# Patient Record
Sex: Female | Born: 1955 | Race: White | Hispanic: No | Marital: Married | Smoking: Never smoker
Health system: Southern US, Community
[De-identification: ages and names within clinical notes are randomized; demographics above are authoritative.]

## PROBLEM LIST (undated history)

## (undated) DIAGNOSIS — R413 Other amnesia: Secondary | ICD-10-CM

## (undated) DIAGNOSIS — F32A Depression, unspecified: Secondary | ICD-10-CM

## (undated) DIAGNOSIS — N259 Disorder resulting from impaired renal tubular function, unspecified: Secondary | ICD-10-CM

## (undated) DIAGNOSIS — F419 Anxiety disorder, unspecified: Secondary | ICD-10-CM

## (undated) DIAGNOSIS — T7840XA Allergy, unspecified, initial encounter: Secondary | ICD-10-CM

## (undated) DIAGNOSIS — J309 Allergic rhinitis, unspecified: Secondary | ICD-10-CM

## (undated) DIAGNOSIS — D649 Anemia, unspecified: Secondary | ICD-10-CM

## (undated) DIAGNOSIS — G43909 Migraine, unspecified, not intractable, without status migrainosus: Secondary | ICD-10-CM

## (undated) DIAGNOSIS — D689 Coagulation defect, unspecified: Secondary | ICD-10-CM

## (undated) DIAGNOSIS — I10 Essential (primary) hypertension: Secondary | ICD-10-CM

## (undated) DIAGNOSIS — Z86718 Personal history of other venous thrombosis and embolism: Secondary | ICD-10-CM

## (undated) DIAGNOSIS — M1990 Unspecified osteoarthritis, unspecified site: Secondary | ICD-10-CM

## (undated) DIAGNOSIS — K219 Gastro-esophageal reflux disease without esophagitis: Secondary | ICD-10-CM

## (undated) DIAGNOSIS — F09 Unspecified mental disorder due to known physiological condition: Secondary | ICD-10-CM

## (undated) DIAGNOSIS — H269 Unspecified cataract: Secondary | ICD-10-CM

## (undated) DIAGNOSIS — E785 Hyperlipidemia, unspecified: Principal | ICD-10-CM

## (undated) DIAGNOSIS — Z853 Personal history of malignant neoplasm of breast: Secondary | ICD-10-CM

## (undated) HISTORY — DX: Essential (primary) hypertension: I10

## (undated) HISTORY — DX: Allergic rhinitis, unspecified: J30.9

## (undated) HISTORY — DX: Depression, unspecified: F32.A

## (undated) HISTORY — DX: Disorder resulting from impaired renal tubular function, unspecified: N25.9

## (undated) HISTORY — PX: BREAST SURGERY: SHX581

## (undated) HISTORY — DX: Personal history of other venous thrombosis and embolism: Z86.718

## (undated) HISTORY — PX: ABDOMINAL HYSTERECTOMY: SHX81

## (undated) HISTORY — DX: Unspecified mental disorder due to known physiological condition: F09

## (undated) HISTORY — DX: Unspecified cataract: H26.9

## (undated) HISTORY — PX: NISSEN FUNDOPLICATION: SHX2091

## (undated) HISTORY — DX: Other amnesia: R41.3

## (undated) HISTORY — DX: Migraine, unspecified, not intractable, without status migrainosus: G43.909

## (undated) HISTORY — PX: VOCAL CORD INJECTION: SHX2663

## (undated) HISTORY — PX: TUBAL LIGATION: SHX77

## (undated) HISTORY — DX: Hyperlipidemia, unspecified: E78.5

## (undated) HISTORY — DX: Gastro-esophageal reflux disease without esophagitis: K21.9

## (undated) HISTORY — DX: Coagulation defect, unspecified: D68.9

## (undated) HISTORY — PX: CHOLECYSTECTOMY: SHX55

## (undated) HISTORY — DX: Personal history of malignant neoplasm of breast: Z85.3

## (undated) HISTORY — PX: KNEE SURGERY: SHX244

## (undated) HISTORY — DX: Unspecified osteoarthritis, unspecified site: M19.90

## (undated) HISTORY — DX: Allergy, unspecified, initial encounter: T78.40XA

## (undated) HISTORY — DX: Anxiety disorder, unspecified: F41.9

## (undated) HISTORY — DX: Anemia, unspecified: D64.9

## (undated) HISTORY — PX: WISDOM TOOTH EXTRACTION: SHX21

---

## 1994-03-17 HISTORY — PX: KNEE SURGERY: SHX244

## 1998-03-15 ENCOUNTER — Other Ambulatory Visit: Admission: RE | Admit: 1998-03-15 | Discharge: 1998-03-15 | Payer: Self-pay | Admitting: Obstetrics and Gynecology

## 1998-05-06 ENCOUNTER — Encounter: Payer: Self-pay | Admitting: Emergency Medicine

## 1998-05-06 ENCOUNTER — Emergency Department (HOSPITAL_COMMUNITY): Admission: EM | Admit: 1998-05-06 | Discharge: 1998-05-06 | Payer: Self-pay | Admitting: Emergency Medicine

## 1998-11-14 ENCOUNTER — Other Ambulatory Visit: Admission: RE | Admit: 1998-11-14 | Discharge: 1998-11-14 | Payer: Self-pay | Admitting: Internal Medicine

## 1999-03-18 DIAGNOSIS — Z86718 Personal history of other venous thrombosis and embolism: Secondary | ICD-10-CM

## 1999-03-18 HISTORY — DX: Personal history of other venous thrombosis and embolism: Z86.718

## 1999-03-18 HISTORY — PX: ABDOMINAL HYSTERECTOMY: SHX81

## 1999-09-19 ENCOUNTER — Encounter (INDEPENDENT_AMBULATORY_CARE_PROVIDER_SITE_OTHER): Payer: Self-pay | Admitting: Specialist

## 1999-09-19 ENCOUNTER — Other Ambulatory Visit: Admission: RE | Admit: 1999-09-19 | Discharge: 1999-09-19 | Payer: Self-pay | Admitting: Internal Medicine

## 2000-04-09 ENCOUNTER — Ambulatory Visit (HOSPITAL_COMMUNITY): Admission: RE | Admit: 2000-04-09 | Discharge: 2000-04-09 | Payer: Self-pay | Admitting: Internal Medicine

## 2000-04-09 ENCOUNTER — Encounter: Payer: Self-pay | Admitting: Internal Medicine

## 2000-09-28 ENCOUNTER — Other Ambulatory Visit: Admission: RE | Admit: 2000-09-28 | Discharge: 2000-09-28 | Payer: Self-pay | Admitting: Obstetrics and Gynecology

## 2000-10-02 ENCOUNTER — Encounter (INDEPENDENT_AMBULATORY_CARE_PROVIDER_SITE_OTHER): Payer: Self-pay

## 2000-10-02 ENCOUNTER — Other Ambulatory Visit: Admission: RE | Admit: 2000-10-02 | Discharge: 2000-10-02 | Payer: Self-pay | Admitting: Obstetrics and Gynecology

## 2000-10-05 ENCOUNTER — Encounter: Admission: RE | Admit: 2000-10-05 | Discharge: 2000-10-05 | Payer: Self-pay | Admitting: Obstetrics and Gynecology

## 2000-10-05 ENCOUNTER — Encounter: Payer: Self-pay | Admitting: Obstetrics and Gynecology

## 2001-02-04 ENCOUNTER — Encounter (INDEPENDENT_AMBULATORY_CARE_PROVIDER_SITE_OTHER): Payer: Self-pay | Admitting: Specialist

## 2001-02-05 ENCOUNTER — Inpatient Hospital Stay (HOSPITAL_COMMUNITY): Admission: RE | Admit: 2001-02-05 | Discharge: 2001-02-06 | Payer: Self-pay | Admitting: *Deleted

## 2001-02-13 ENCOUNTER — Inpatient Hospital Stay (HOSPITAL_COMMUNITY): Admission: EM | Admit: 2001-02-13 | Discharge: 2001-02-22 | Payer: Self-pay | Admitting: Emergency Medicine

## 2001-02-13 ENCOUNTER — Encounter: Payer: Self-pay | Admitting: *Deleted

## 2002-10-20 ENCOUNTER — Encounter: Payer: Self-pay | Admitting: Orthopedic Surgery

## 2002-10-20 ENCOUNTER — Encounter: Admission: RE | Admit: 2002-10-20 | Discharge: 2002-10-20 | Payer: Self-pay | Admitting: Orthopedic Surgery

## 2003-03-09 ENCOUNTER — Encounter: Admission: RE | Admit: 2003-03-09 | Discharge: 2003-03-09 | Payer: Self-pay | Admitting: Surgery

## 2003-03-09 ENCOUNTER — Encounter (INDEPENDENT_AMBULATORY_CARE_PROVIDER_SITE_OTHER): Payer: Self-pay | Admitting: *Deleted

## 2004-01-19 ENCOUNTER — Ambulatory Visit: Payer: Self-pay | Admitting: Internal Medicine

## 2004-05-17 ENCOUNTER — Ambulatory Visit: Payer: Self-pay | Admitting: Internal Medicine

## 2004-05-20 ENCOUNTER — Ambulatory Visit: Payer: Self-pay | Admitting: Internal Medicine

## 2004-05-30 ENCOUNTER — Ambulatory Visit: Payer: Self-pay | Admitting: Internal Medicine

## 2004-06-03 ENCOUNTER — Ambulatory Visit: Payer: Self-pay | Admitting: Internal Medicine

## 2004-06-17 ENCOUNTER — Ambulatory Visit: Payer: Self-pay | Admitting: Internal Medicine

## 2004-09-24 ENCOUNTER — Ambulatory Visit: Payer: Self-pay | Admitting: Internal Medicine

## 2004-10-22 ENCOUNTER — Ambulatory Visit: Payer: Self-pay | Admitting: Internal Medicine

## 2004-12-27 ENCOUNTER — Ambulatory Visit: Payer: Self-pay | Admitting: Internal Medicine

## 2005-01-23 ENCOUNTER — Ambulatory Visit: Payer: Self-pay | Admitting: Internal Medicine

## 2005-03-13 ENCOUNTER — Ambulatory Visit: Payer: Self-pay | Admitting: Internal Medicine

## 2005-03-17 DIAGNOSIS — C50919 Malignant neoplasm of unspecified site of unspecified female breast: Secondary | ICD-10-CM

## 2005-03-17 HISTORY — DX: Malignant neoplasm of unspecified site of unspecified female breast: C50.919

## 2005-04-17 ENCOUNTER — Ambulatory Visit: Payer: Self-pay | Admitting: Internal Medicine

## 2005-06-16 ENCOUNTER — Ambulatory Visit: Payer: Self-pay | Admitting: Internal Medicine

## 2005-09-15 ENCOUNTER — Ambulatory Visit: Payer: Self-pay | Admitting: Internal Medicine

## 2005-09-23 ENCOUNTER — Other Ambulatory Visit: Admission: RE | Admit: 2005-09-23 | Discharge: 2005-09-23 | Payer: Self-pay | Admitting: *Deleted

## 2005-10-16 ENCOUNTER — Encounter (INDEPENDENT_AMBULATORY_CARE_PROVIDER_SITE_OTHER): Payer: Self-pay | Admitting: *Deleted

## 2005-10-16 ENCOUNTER — Encounter (INDEPENDENT_AMBULATORY_CARE_PROVIDER_SITE_OTHER): Payer: Self-pay | Admitting: Diagnostic Radiology

## 2005-10-16 ENCOUNTER — Encounter: Admission: RE | Admit: 2005-10-16 | Discharge: 2005-10-16 | Payer: Self-pay | Admitting: Surgery

## 2005-10-24 ENCOUNTER — Encounter: Admission: RE | Admit: 2005-10-24 | Discharge: 2005-10-24 | Payer: Self-pay | Admitting: Internal Medicine

## 2005-10-28 ENCOUNTER — Encounter: Admission: RE | Admit: 2005-10-28 | Discharge: 2005-10-28 | Payer: Self-pay | Admitting: Surgery

## 2005-10-31 ENCOUNTER — Encounter (INDEPENDENT_AMBULATORY_CARE_PROVIDER_SITE_OTHER): Payer: Self-pay | Admitting: *Deleted

## 2005-10-31 ENCOUNTER — Encounter: Admission: RE | Admit: 2005-10-31 | Discharge: 2005-10-31 | Payer: Self-pay | Admitting: Surgery

## 2005-10-31 ENCOUNTER — Ambulatory Visit (HOSPITAL_BASED_OUTPATIENT_CLINIC_OR_DEPARTMENT_OTHER): Admission: RE | Admit: 2005-10-31 | Discharge: 2005-10-31 | Payer: Self-pay | Admitting: Surgery

## 2005-11-04 ENCOUNTER — Ambulatory Visit: Payer: Self-pay | Admitting: Oncology

## 2005-11-12 LAB — COMPREHENSIVE METABOLIC PANEL
AST: 14 U/L (ref 0–37)
Albumin: 4.2 g/dL (ref 3.5–5.2)
Alkaline Phosphatase: 81 U/L (ref 39–117)
BUN: 26 mg/dL — ABNORMAL HIGH (ref 6–23)
Calcium: 9.5 mg/dL (ref 8.4–10.5)
Chloride: 106 mEq/L (ref 96–112)
Creatinine, Ser: 0.85 mg/dL (ref 0.40–1.20)
Glucose, Bld: 105 mg/dL — ABNORMAL HIGH (ref 70–99)
Potassium: 4.2 mEq/L (ref 3.5–5.3)

## 2005-11-12 LAB — CBC WITH DIFFERENTIAL/PLATELET
Basophils Absolute: 0 10*3/uL (ref 0.0–0.1)
EOS%: 2.5 % (ref 0.0–7.0)
Eosinophils Absolute: 0.2 10*3/uL (ref 0.0–0.5)
HCT: 34.3 % — ABNORMAL LOW (ref 34.8–46.6)
HGB: 11.9 g/dL (ref 11.6–15.9)
MCH: 32 pg (ref 26.0–34.0)
MCV: 92.4 fL (ref 81.0–101.0)
MONO%: 7.3 % (ref 0.0–13.0)
NEUT#: 4.8 10*3/uL (ref 1.5–6.5)
NEUT%: 60 % (ref 39.6–76.8)

## 2005-11-12 LAB — LACTATE DEHYDROGENASE: LDH: 148 U/L (ref 94–250)

## 2005-11-14 ENCOUNTER — Ambulatory Visit (HOSPITAL_COMMUNITY): Admission: RE | Admit: 2005-11-14 | Discharge: 2005-11-14 | Payer: Self-pay | Admitting: Oncology

## 2005-11-20 ENCOUNTER — Ambulatory Visit (HOSPITAL_COMMUNITY): Admission: RE | Admit: 2005-11-20 | Discharge: 2005-11-20 | Payer: Self-pay | Admitting: Oncology

## 2005-11-25 ENCOUNTER — Ambulatory Visit: Admission: RE | Admit: 2005-11-25 | Discharge: 2005-11-25 | Payer: Self-pay | Admitting: Oncology

## 2005-11-25 ENCOUNTER — Encounter (INDEPENDENT_AMBULATORY_CARE_PROVIDER_SITE_OTHER): Payer: Self-pay | Admitting: Cardiology

## 2005-11-27 ENCOUNTER — Ambulatory Visit (HOSPITAL_BASED_OUTPATIENT_CLINIC_OR_DEPARTMENT_OTHER): Admission: RE | Admit: 2005-11-27 | Discharge: 2005-11-27 | Payer: Self-pay | Admitting: Surgery

## 2005-11-27 ENCOUNTER — Encounter (INDEPENDENT_AMBULATORY_CARE_PROVIDER_SITE_OTHER): Payer: Self-pay | Admitting: *Deleted

## 2005-12-02 ENCOUNTER — Ambulatory Visit: Payer: Self-pay | Admitting: Internal Medicine

## 2005-12-02 ENCOUNTER — Encounter: Admission: RE | Admit: 2005-12-02 | Discharge: 2005-12-02 | Payer: Self-pay | Admitting: Surgery

## 2005-12-09 ENCOUNTER — Ambulatory Visit: Payer: Self-pay | Admitting: Internal Medicine

## 2005-12-18 ENCOUNTER — Ambulatory Visit: Payer: Self-pay | Admitting: Oncology

## 2005-12-18 ENCOUNTER — Ambulatory Visit (HOSPITAL_COMMUNITY): Admission: RE | Admit: 2005-12-18 | Discharge: 2005-12-18 | Payer: Self-pay | Admitting: Oncology

## 2005-12-24 LAB — CBC WITH DIFFERENTIAL/PLATELET
Basophils Absolute: 0 10*3/uL (ref 0.0–0.1)
Eosinophils Absolute: 0.1 10*3/uL (ref 0.0–0.5)
HGB: 12.3 g/dL (ref 11.6–15.9)
MONO#: 0 10*3/uL — ABNORMAL LOW (ref 0.1–0.9)
NEUT#: 1.9 10*3/uL (ref 1.5–6.5)
RBC: 3.84 10*6/uL (ref 3.70–5.32)
RDW: 12.8 % (ref 11.3–14.5)
WBC: 3.2 10*3/uL — ABNORMAL LOW (ref 3.9–10.0)

## 2005-12-31 LAB — CBC WITH DIFFERENTIAL/PLATELET
Basophils Absolute: 0 10*3/uL (ref 0.0–0.1)
Eosinophils Absolute: 0.1 10*3/uL (ref 0.0–0.5)
HCT: 33.5 % — ABNORMAL LOW (ref 34.8–46.6)
HGB: 11.6 g/dL (ref 11.6–15.9)
LYMPH%: 10.2 % — ABNORMAL LOW (ref 14.0–48.0)
MCV: 92.1 fL (ref 81.0–101.0)
MONO#: 1.1 10*3/uL — ABNORMAL HIGH (ref 0.1–0.9)
NEUT#: 16.7 10*3/uL — ABNORMAL HIGH (ref 1.5–6.5)
NEUT%: 84.1 % — ABNORMAL HIGH (ref 39.6–76.8)
Platelets: 306 10*3/uL (ref 145–400)
WBC: 19.8 10*3/uL — ABNORMAL HIGH (ref 3.9–10.0)

## 2006-01-14 LAB — CBC WITH DIFFERENTIAL/PLATELET
Basophils Absolute: 0 10*3/uL (ref 0.0–0.1)
Eosinophils Absolute: 0 10*3/uL (ref 0.0–0.5)
HCT: 30.8 % — ABNORMAL LOW (ref 34.8–46.6)
HGB: 10.7 g/dL — ABNORMAL LOW (ref 11.6–15.9)
MONO#: 0.8 10*3/uL (ref 0.1–0.9)
NEUT#: 13.5 10*3/uL — ABNORMAL HIGH (ref 1.5–6.5)
NEUT%: 85 % — ABNORMAL HIGH (ref 39.6–76.8)
RDW: 12.5 % (ref 11.3–14.5)
WBC: 15.9 10*3/uL — ABNORMAL HIGH (ref 3.9–10.0)
lymph#: 1.6 10*3/uL (ref 0.9–3.3)

## 2006-01-21 LAB — COMPREHENSIVE METABOLIC PANEL
AST: 13 U/L (ref 0–37)
Alkaline Phosphatase: 129 U/L — ABNORMAL HIGH (ref 39–117)
BUN: 21 mg/dL (ref 6–23)
Glucose, Bld: 124 mg/dL — ABNORMAL HIGH (ref 70–99)
Sodium: 136 mEq/L (ref 135–145)
Total Bilirubin: 0.5 mg/dL (ref 0.3–1.2)
Total Protein: 5.7 g/dL — ABNORMAL LOW (ref 6.0–8.3)

## 2006-01-21 LAB — CBC WITH DIFFERENTIAL/PLATELET
EOS%: 0.1 % (ref 0.0–7.0)
Eosinophils Absolute: 0 10*3/uL (ref 0.0–0.5)
LYMPH%: 10.3 % — ABNORMAL LOW (ref 14.0–48.0)
MCH: 31.4 pg (ref 26.0–34.0)
MCHC: 34.2 g/dL (ref 32.0–36.0)
MCV: 91.7 fL (ref 81.0–101.0)
MONO%: 0.5 % (ref 0.0–13.0)
NEUT#: 3.5 10*3/uL (ref 1.5–6.5)
Platelets: 189 10*3/uL (ref 145–400)
RBC: 3.7 10*6/uL (ref 3.70–5.32)
RDW: 13 % (ref 11.3–14.5)

## 2006-01-28 LAB — CBC WITH DIFFERENTIAL/PLATELET
Basophils Absolute: 0 10*3/uL (ref 0.0–0.1)
Eosinophils Absolute: 0 10*3/uL (ref 0.0–0.5)
HGB: 11 g/dL — ABNORMAL LOW (ref 11.6–15.9)
MONO#: 0.8 10*3/uL (ref 0.1–0.9)
NEUT#: 10.1 10*3/uL — ABNORMAL HIGH (ref 1.5–6.5)
RBC: 3.48 10*6/uL — ABNORMAL LOW (ref 3.70–5.32)
RDW: 13.9 % (ref 11.3–14.5)
WBC: 11.7 10*3/uL — ABNORMAL HIGH (ref 3.9–10.0)
lymph#: 0.8 10*3/uL — ABNORMAL LOW (ref 0.9–3.3)

## 2006-01-29 ENCOUNTER — Ambulatory Visit: Payer: Self-pay | Admitting: Oncology

## 2006-02-02 LAB — CBC WITH DIFFERENTIAL/PLATELET
Basophils Absolute: 0 10*3/uL (ref 0.0–0.1)
Eosinophils Absolute: 0 10*3/uL (ref 0.0–0.5)
HGB: 10.3 g/dL — ABNORMAL LOW (ref 11.6–15.9)
LYMPH%: 2.1 % — ABNORMAL LOW (ref 14.0–48.0)
MCV: 91.4 fL (ref 81.0–101.0)
MONO#: 0 10*3/uL — ABNORMAL LOW (ref 0.1–0.9)
MONO%: 0.4 % (ref 0.0–13.0)
NEUT#: 12 10*3/uL — ABNORMAL HIGH (ref 1.5–6.5)
Platelets: 142 10*3/uL — ABNORMAL LOW (ref 145–400)
RBC: 3.31 10*6/uL — ABNORMAL LOW (ref 3.70–5.32)
WBC: 12.4 10*3/uL — ABNORMAL HIGH (ref 3.9–10.0)

## 2006-02-02 LAB — COMPREHENSIVE METABOLIC PANEL
Albumin: 3.9 g/dL (ref 3.5–5.2)
Alkaline Phosphatase: 123 U/L — ABNORMAL HIGH (ref 39–117)
BUN: 13 mg/dL (ref 6–23)
CO2: 21 mEq/L (ref 19–32)
Glucose, Bld: 92 mg/dL (ref 70–99)
Potassium: 3.8 mEq/L (ref 3.5–5.3)
Sodium: 139 mEq/L (ref 135–145)
Total Bilirubin: 0.5 mg/dL (ref 0.3–1.2)
Total Protein: 5.8 g/dL — ABNORMAL LOW (ref 6.0–8.3)

## 2006-02-05 ENCOUNTER — Ambulatory Visit: Payer: Self-pay | Admitting: Oncology

## 2006-02-06 ENCOUNTER — Inpatient Hospital Stay (HOSPITAL_COMMUNITY): Admission: EM | Admit: 2006-02-06 | Discharge: 2006-02-09 | Payer: Self-pay | Admitting: Emergency Medicine

## 2006-02-16 ENCOUNTER — Ambulatory Visit: Payer: Self-pay | Admitting: Internal Medicine

## 2006-02-17 ENCOUNTER — Encounter: Admission: RE | Admit: 2006-02-17 | Discharge: 2006-02-17 | Payer: Self-pay | Admitting: Internal Medicine

## 2006-03-17 HISTORY — PX: BREAST SURGERY: SHX581

## 2006-03-20 ENCOUNTER — Ambulatory Visit: Payer: Self-pay | Admitting: Oncology

## 2006-03-30 LAB — CBC WITH DIFFERENTIAL/PLATELET
BASO%: 0.2 % (ref 0.0–2.0)
Eosinophils Absolute: 0.1 10*3/uL (ref 0.0–0.5)
LYMPH%: 30.1 % (ref 14.0–48.0)
MCHC: 34.6 g/dL (ref 32.0–36.0)
MONO#: 0.4 10*3/uL (ref 0.1–0.9)
NEUT#: 3.6 10*3/uL (ref 1.5–6.5)
Platelets: 317 10*3/uL (ref 145–400)
RBC: 3.73 10*6/uL (ref 3.70–5.32)
WBC: 5.8 10*3/uL (ref 3.9–10.0)
lymph#: 1.8 10*3/uL (ref 0.9–3.3)

## 2006-03-30 LAB — COMPREHENSIVE METABOLIC PANEL
ALT: 21 U/L (ref 0–35)
AST: 16 U/L (ref 0–37)
Albumin: 4.3 g/dL (ref 3.5–5.2)
BUN: 19 mg/dL (ref 6–23)
Calcium: 9.5 mg/dL (ref 8.4–10.5)
Chloride: 109 mEq/L (ref 96–112)
Potassium: 4.1 mEq/L (ref 3.5–5.3)

## 2006-04-09 ENCOUNTER — Encounter (INDEPENDENT_AMBULATORY_CARE_PROVIDER_SITE_OTHER): Payer: Self-pay | Admitting: *Deleted

## 2006-04-10 ENCOUNTER — Inpatient Hospital Stay (HOSPITAL_COMMUNITY): Admission: EM | Admit: 2006-04-10 | Discharge: 2006-04-11 | Payer: Self-pay | Admitting: Surgery

## 2006-04-17 ENCOUNTER — Ambulatory Visit: Admission: RE | Admit: 2006-04-17 | Discharge: 2006-07-22 | Payer: Self-pay | Admitting: Radiation Oncology

## 2006-06-18 ENCOUNTER — Ambulatory Visit: Payer: Self-pay | Admitting: Oncology

## 2006-06-23 LAB — CBC WITH DIFFERENTIAL/PLATELET
EOS%: 1.2 % (ref 0.0–7.0)
MCH: 31.2 pg (ref 26.0–34.0)
MCHC: 36.3 g/dL — ABNORMAL HIGH (ref 32.0–36.0)
MCV: 86 fL (ref 81.0–101.0)
MONO%: 8.7 % (ref 0.0–13.0)
RBC: 4.01 10*6/uL (ref 3.70–5.32)
RDW: 12.9 % (ref 11.3–14.5)

## 2006-06-23 LAB — COMPREHENSIVE METABOLIC PANEL
AST: 13 U/L (ref 0–37)
Albumin: 4.3 g/dL (ref 3.5–5.2)
Alkaline Phosphatase: 88 U/L (ref 39–117)
BUN: 17 mg/dL (ref 6–23)
Potassium: 3.8 mEq/L (ref 3.5–5.3)
Sodium: 141 mEq/L (ref 135–145)
Total Protein: 6.8 g/dL (ref 6.0–8.3)

## 2006-07-03 LAB — ESTRADIOL, ULTRA SENS: Estradiol, Ultra Sensitive: <2 pg/mL

## 2006-07-07 LAB — CBC WITH DIFFERENTIAL/PLATELET
BASO%: 0.4 % (ref 0.0–2.0)
Eosinophils Absolute: 0.1 10*3/uL (ref 0.0–0.5)
HCT: 36 % (ref 34.8–46.6)
MCHC: 35.1 g/dL (ref 32.0–36.0)
MONO#: 0.2 10*3/uL (ref 0.1–0.9)
NEUT#: 2.6 10*3/uL (ref 1.5–6.5)
RBC: 4.11 10*6/uL (ref 3.70–5.32)
WBC: 3.6 10*3/uL — ABNORMAL LOW (ref 3.9–10.0)
lymph#: 0.7 10*3/uL — ABNORMAL LOW (ref 0.9–3.3)

## 2006-07-07 LAB — CANCER ANTIGEN 27.29: CA 27.29: 14 U/mL (ref 0–39)

## 2006-07-07 LAB — COMPREHENSIVE METABOLIC PANEL
ALT: 23 U/L (ref 0–35)
Albumin: 4.4 g/dL (ref 3.5–5.2)
CO2: 24 mEq/L (ref 19–32)
Calcium: 9.5 mg/dL (ref 8.4–10.5)
Chloride: 108 mEq/L (ref 96–112)
Sodium: 142 mEq/L (ref 135–145)
Total Protein: 7.1 g/dL (ref 6.0–8.3)

## 2006-07-09 ENCOUNTER — Ambulatory Visit: Payer: Self-pay | Admitting: Internal Medicine

## 2006-07-09 LAB — CONVERTED CEMR LAB
Cholesterol: 196 mg/dL (ref 0–200)
Direct LDL: 105.9 mg/dL
HDL: 41.8 mg/dL (ref >39.0)
Total CHOL/HDL Ratio: 4.7
Triglycerides: 273 mg/dL (ref 0–149)
VLDL: 55 mg/dL — ABNORMAL HIGH (ref 0–40)

## 2006-07-15 ENCOUNTER — Ambulatory Visit: Payer: Self-pay | Admitting: Family Medicine

## 2006-07-27 ENCOUNTER — Ambulatory Visit (HOSPITAL_COMMUNITY): Admission: RE | Admit: 2006-07-27 | Discharge: 2006-07-28 | Payer: Self-pay | Admitting: *Deleted

## 2006-07-27 ENCOUNTER — Encounter (INDEPENDENT_AMBULATORY_CARE_PROVIDER_SITE_OTHER): Payer: Self-pay | Admitting: Specialist

## 2006-08-06 ENCOUNTER — Ambulatory Visit: Payer: Self-pay | Admitting: Oncology

## 2006-08-14 ENCOUNTER — Ambulatory Visit: Payer: Self-pay | Admitting: Internal Medicine

## 2006-09-22 ENCOUNTER — Ambulatory Visit: Payer: Self-pay | Admitting: Oncology

## 2006-10-26 ENCOUNTER — Telehealth: Payer: Self-pay | Admitting: Internal Medicine

## 2006-10-27 DIAGNOSIS — E785 Hyperlipidemia, unspecified: Secondary | ICD-10-CM

## 2006-10-27 DIAGNOSIS — K219 Gastro-esophageal reflux disease without esophagitis: Secondary | ICD-10-CM | POA: Insufficient documentation

## 2006-10-27 DIAGNOSIS — J309 Allergic rhinitis, unspecified: Secondary | ICD-10-CM

## 2006-10-27 DIAGNOSIS — I10 Essential (primary) hypertension: Secondary | ICD-10-CM | POA: Insufficient documentation

## 2006-10-27 DIAGNOSIS — Z853 Personal history of malignant neoplasm of breast: Secondary | ICD-10-CM | POA: Insufficient documentation

## 2006-10-27 DIAGNOSIS — Z86718 Personal history of other venous thrombosis and embolism: Secondary | ICD-10-CM | POA: Insufficient documentation

## 2006-10-27 HISTORY — DX: Essential (primary) hypertension: I10

## 2006-10-27 HISTORY — DX: Gastro-esophageal reflux disease without esophagitis: K21.9

## 2006-10-27 HISTORY — DX: Hyperlipidemia, unspecified: E78.5

## 2006-10-27 HISTORY — DX: Allergic rhinitis, unspecified: J30.9

## 2006-12-10 IMAGING — CT CT CHEST W/ CM
3 series · 17 of 30 positions shown, 19 images · IV contrast ([ID] OMNIP 300%)
Comparison: none

CLINICAL DATA: Recently diagnosed right breast cancer with lymph node metastases.  Staging.  
CHEST CT WITH CONTRAST:
TECHNIQUE: Multidetector CT imaging of the chest was performed following the standard protocol during bolus administration of intravenous contrast.
Contrast:  100 ml Omnipaque 300.

[Series 2: chest w/ · axial · 0.62mm/px · z∈[-234,-59]mm · 5 of 59 slices shown, 7 images]
[im 12/59  mediastinal]
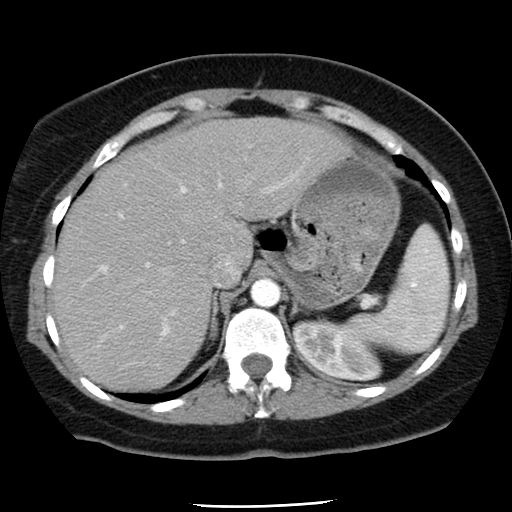
[im 12/59  lung]
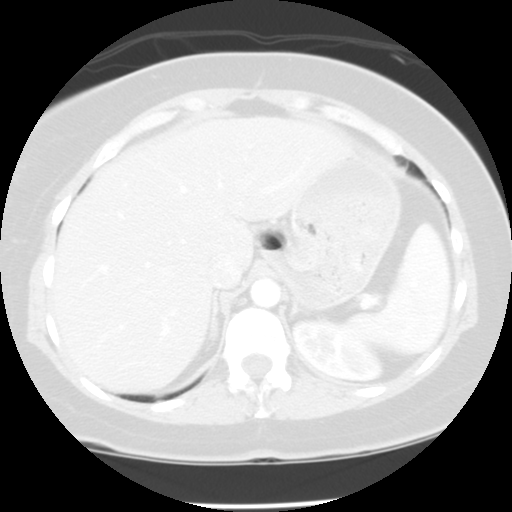
[im 24/59  lung]
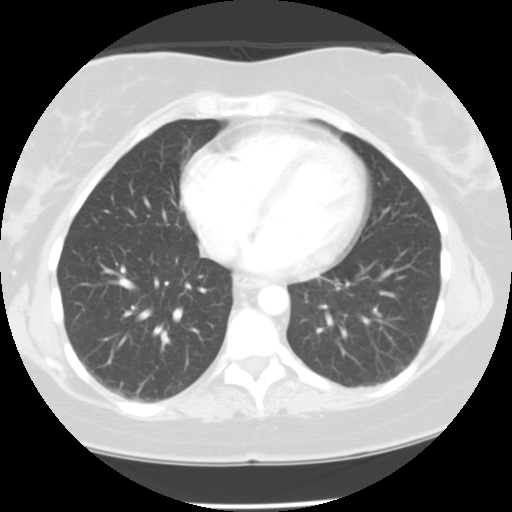
[im 33/59  lung]
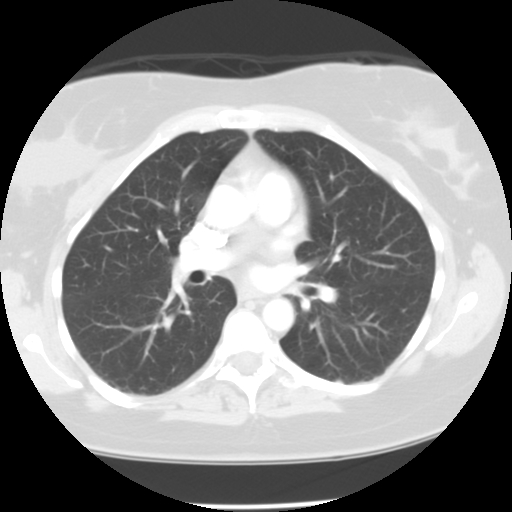
[im 35/59  lung]
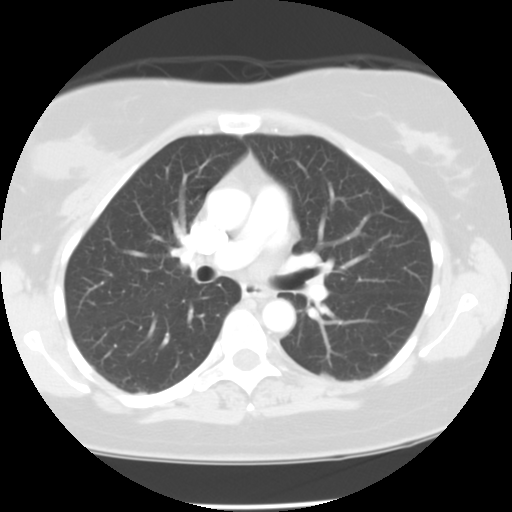
[im 47/59  mediastinal]
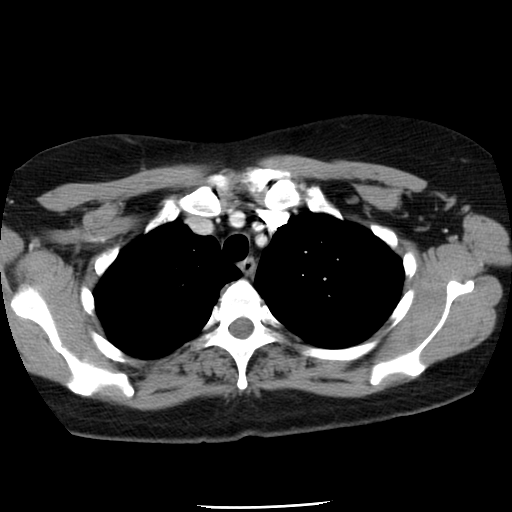
[im 47/59  lung]
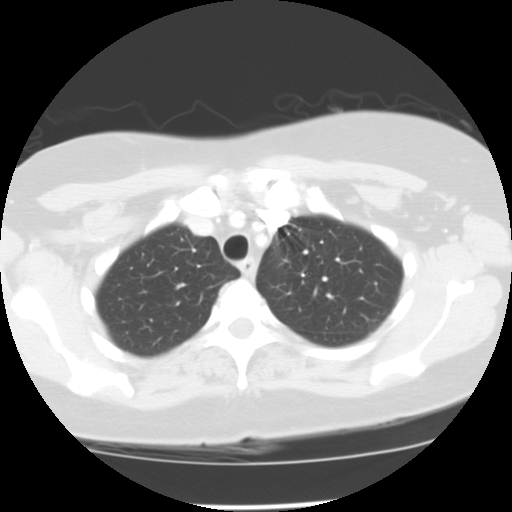

[Series 400: reformatted · coronal · 0.62mm/px · 4 of 96 slices shown (1 of 2)]
[im 11/96  lung]
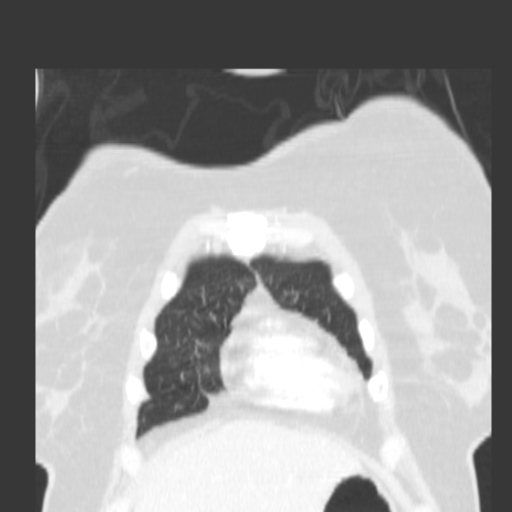
[im 22/96  lung]
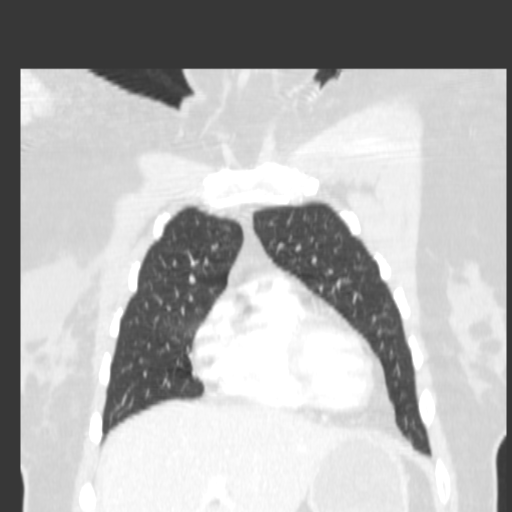
[im 32/96  lung]
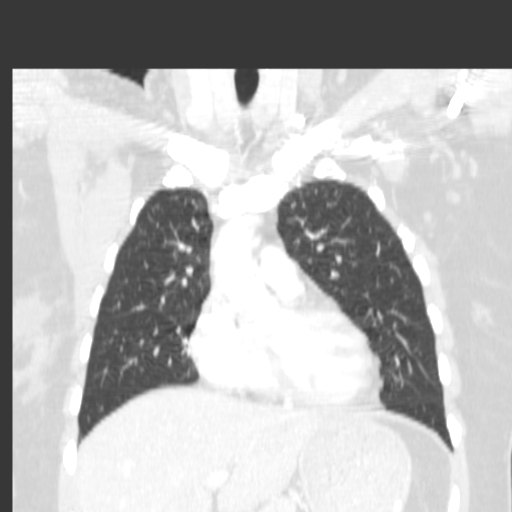
[im 43/96  lung]
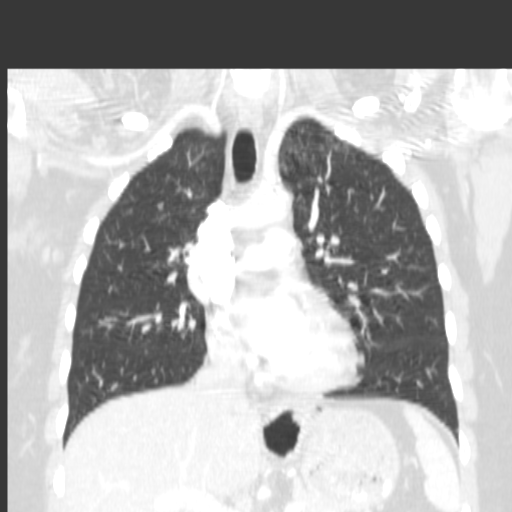

[Series 401: reformatted · sagittal · 0.62mm/px · 8 of 125 slices shown (2 of 2)]
[im 12/125  lung]
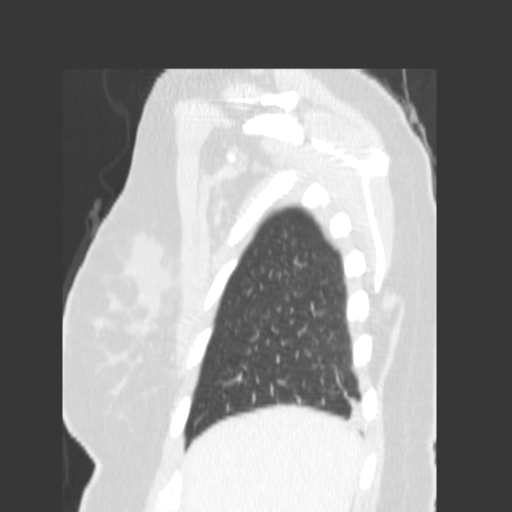
[im 34/125  lung]
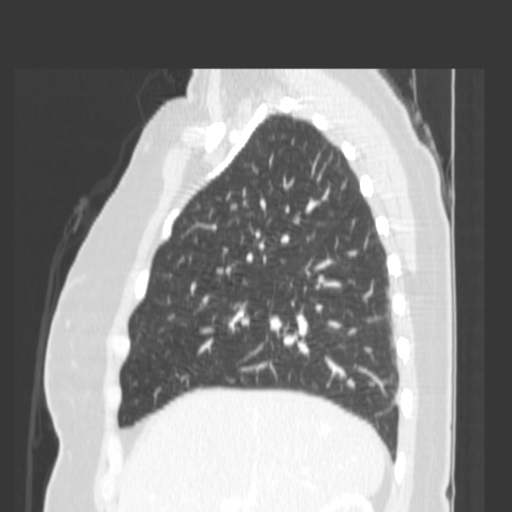
[im 46/125  lung]
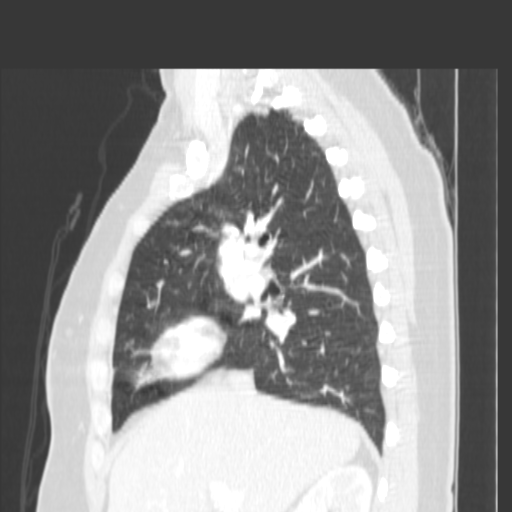
[im 57/125  lung]
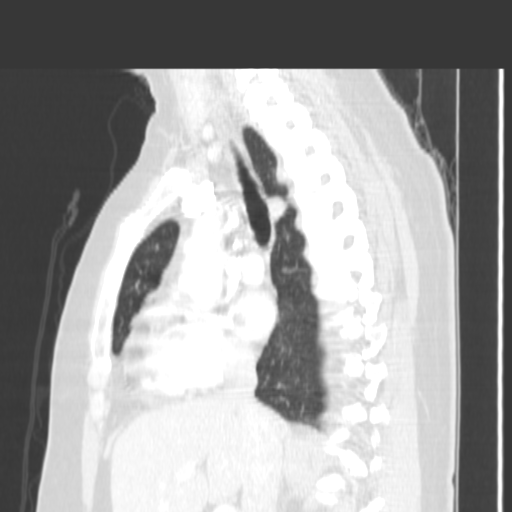
[im 68/125  lung]
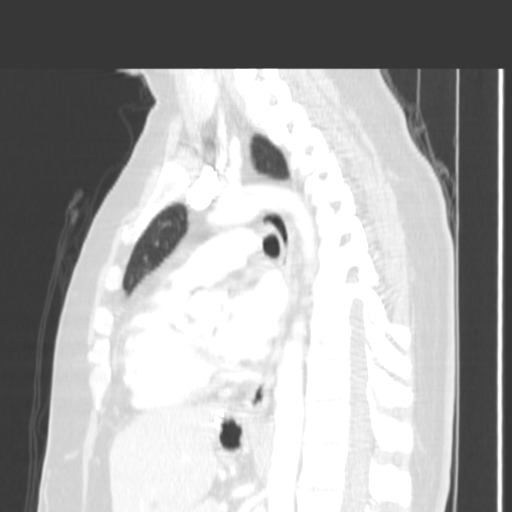
[im 79/125  lung]
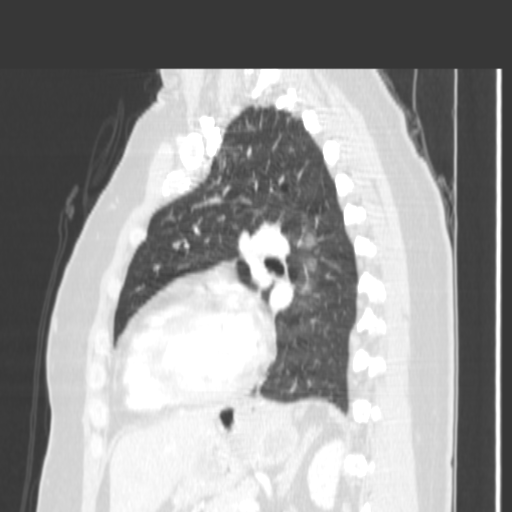
[im 91/125  lung]
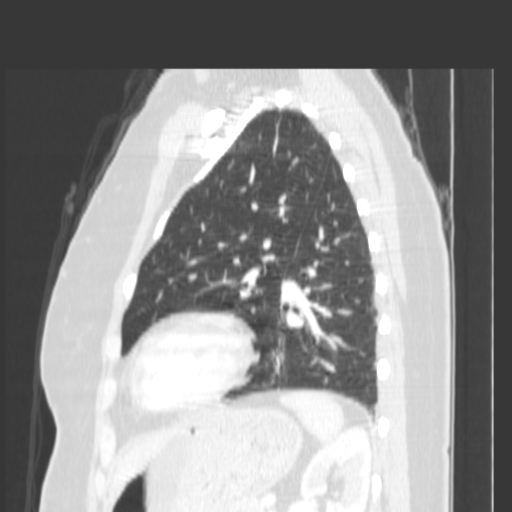
[im 113/125  lung]
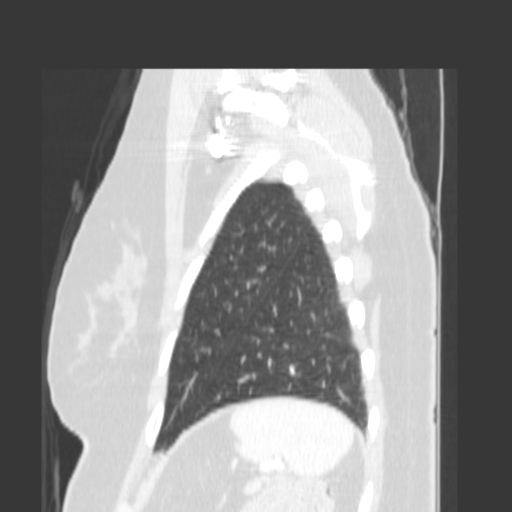

[17 of 30 positions shown; findings below may reference images not displayed]

FINDINGS: A fluid collection is seen in the lateral right breast measuring approximately 3.5 x 5.0 cm, which is consistent with postoperative seroma.  Recent postoperative changes are also noted in the right axilla.  There is no evidence of pathologically enlarged nodes or other chest wall soft tissue masses.  There is no evidence of hilar or mediastinal mass.  Postsurgical changes are seen from previous Nissen fundoplication.  
There is no evidence of pleural or pericardial effusion.  No suspicious pulmonary nodules or masses are seen and there is no evidence of pulmonary infiltrate.  Mild bibasilar scarring is noted.  
Images obtained through the upper abdomen show normal appearance of the adrenal glands.  There is a tiny 9 mm low-density lesion in the left hepatic lobe, which is nonspecific and difficult to characterize due to its small size.
IMPRESSION: 1.  Postoperative seroma in the right lateral breast and postoperative changes in the right axilla.  No evidence of masses or adenopathy within the thorax.  
2.  Previous Nissen fundoplication.
3.  Tiny less than 1 cm indeterminate lesion in the left hepatic lobe.

## 2006-12-15 ENCOUNTER — Encounter: Payer: Self-pay | Admitting: Internal Medicine

## 2006-12-22 ENCOUNTER — Inpatient Hospital Stay (HOSPITAL_COMMUNITY): Admission: RE | Admit: 2006-12-22 | Discharge: 2006-12-26 | Payer: Self-pay | Admitting: Surgery

## 2006-12-22 ENCOUNTER — Encounter (INDEPENDENT_AMBULATORY_CARE_PROVIDER_SITE_OTHER): Payer: Self-pay | Admitting: Surgery

## 2006-12-23 ENCOUNTER — Encounter (INDEPENDENT_AMBULATORY_CARE_PROVIDER_SITE_OTHER): Payer: Self-pay | Admitting: Plastic Surgery

## 2007-01-13 IMAGING — RF DG FLUORO RM 1-60 MIN
5 series · 19 of 24 positions shown · IV contrast (omnipaque)
Comparison: none

CLINICAL DATA: 50 year old female with breast cancer had Port-a-cath placed by Dr. Tiff on 11/27/05.  Infusion nurse at the [HOSPITAL] unable to aspirate blood from port today.  Request to perform Port-a-cath injection under fluoroscopy to evaluate.
PORT-A-CATH INJECTION UNDER FLUOROSCOPY ? 12/18/05: 
Written informed consent was obtained from the patient prior to the procedure.   
The patient arrived in the [HOSPITAL] with the left subclavian port already accessed.  Initial scout chest radiograph demonstrated the left subclavian Port-a-cath overlying the upper chest.  There was some degree of difficulty in aspirating blood back from this port but saline was easy to inject.  Contrast was then injected and imaging was performed.  This demonstrated a prominent fibrin sheath along the tip of the catheter.  A small amount of contrast was seen to reflux back along the catheter, from the fibrin sheath.   small amount of contrast was seen pooling around the azygos orifice.  Contrast was seen to flow into the superior vena cava.  The tip of the catheter barely extends into the upper SVC.  The catheter was then flushed well with heparinized saline and deaccessed.  Results were called to Dr. Jeovar?Yannek nurse immediately post-procedure.  
Dr. Pulseiras Magalhaes Pereira personally supervised the above procedure.  
Contrast:  15 cc Omnipaque 300.
Fluoro Time:   1.1 minutes.

[Series 1: run · 1 of 1 slices shown (1 of 5)]
[im 1/1]
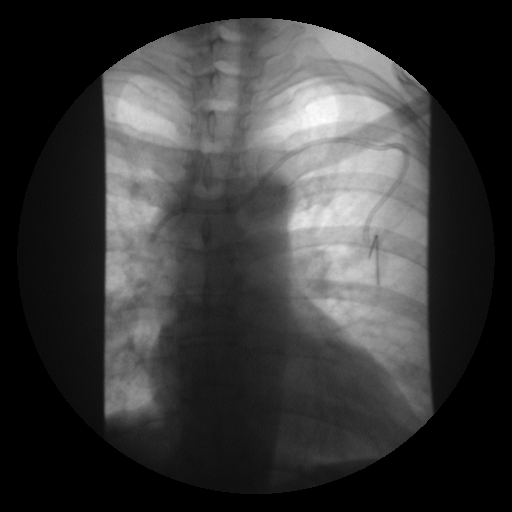

[Series 2: run · 1 of 1 slices shown (2 of 5)]
[im 1/1]
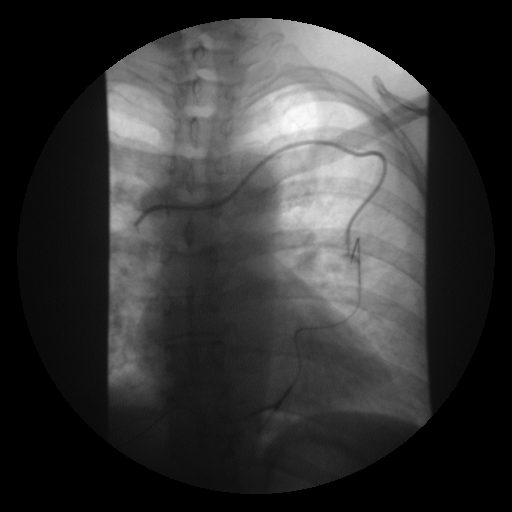

[Series 3: run · 7 of 23 slices shown (3 of 5)]
[im 3/23]
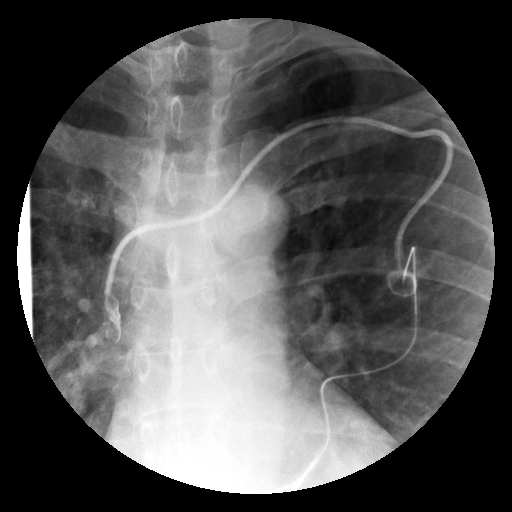
[im 5/23]
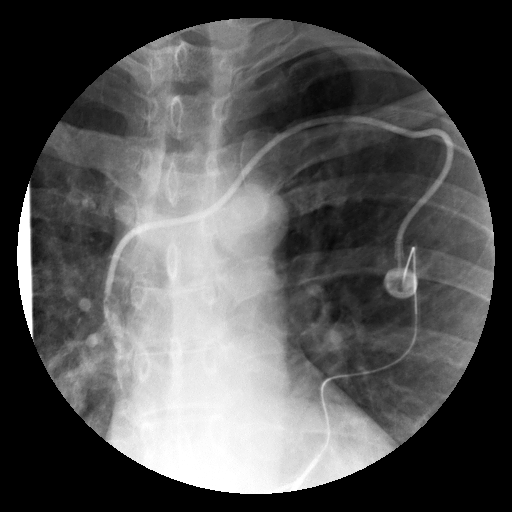
[im 8/23]
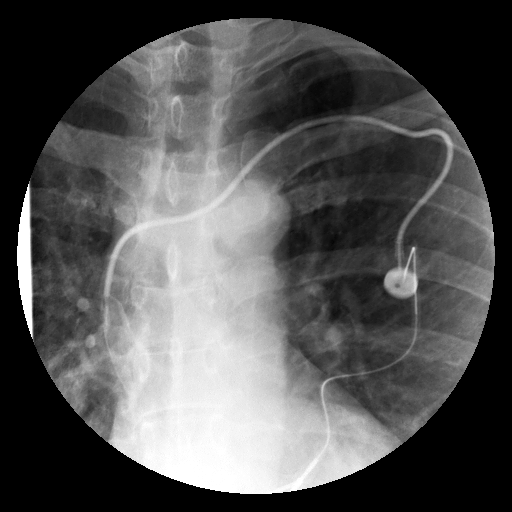
[im 10/23]
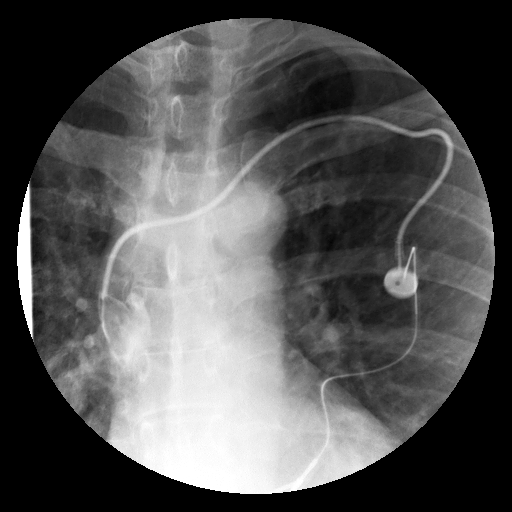
[im 15/23]
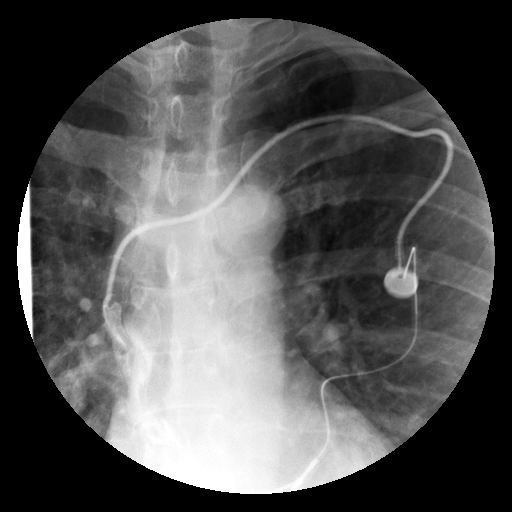
[im 18/23]
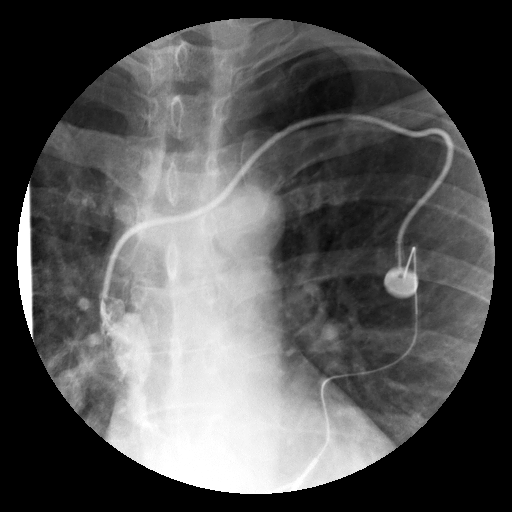
[im 20/23]
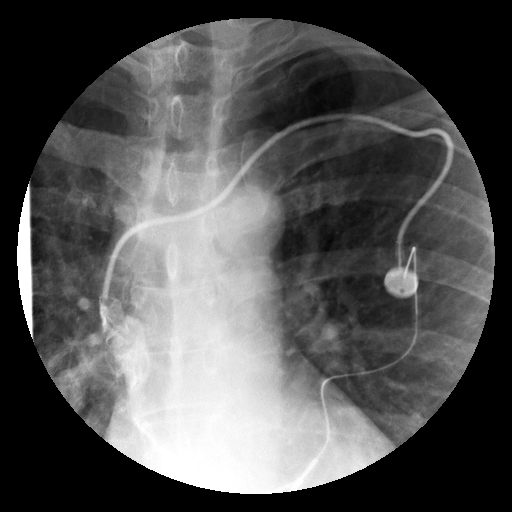

[Series 4: run · 4 of 9 slices shown (4 of 5)]
[im 1/9]
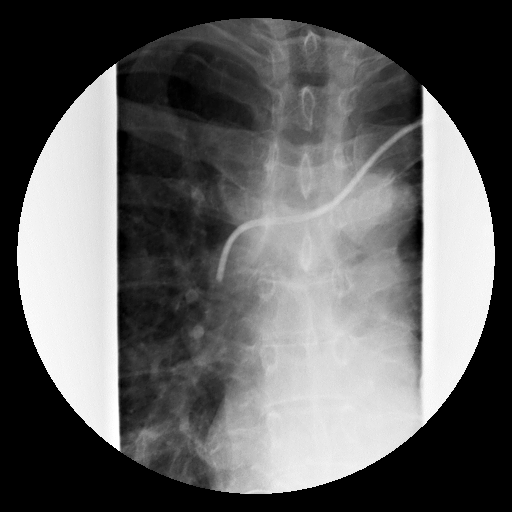
[im 3/9]
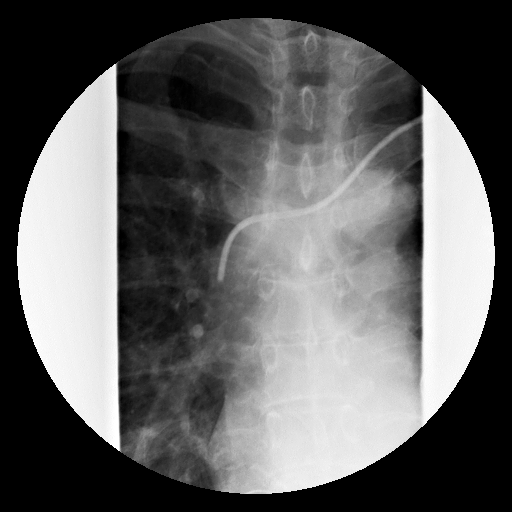
[im 6/9]
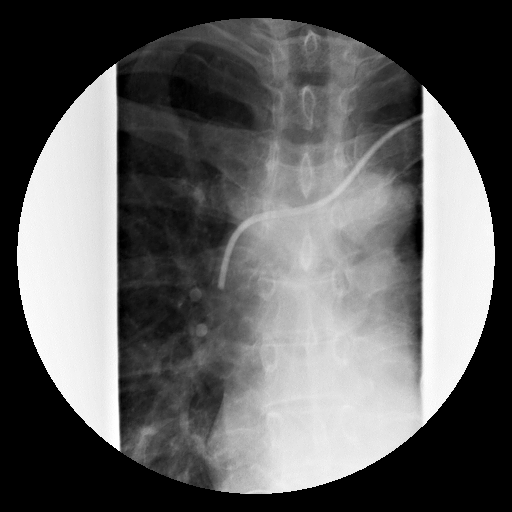
[im 9/9]
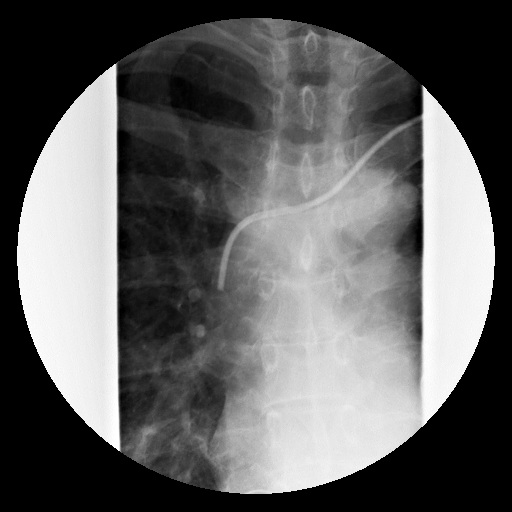

[Series 5: run · 6 of 17 slices shown (5 of 5)]
[im 3/17]
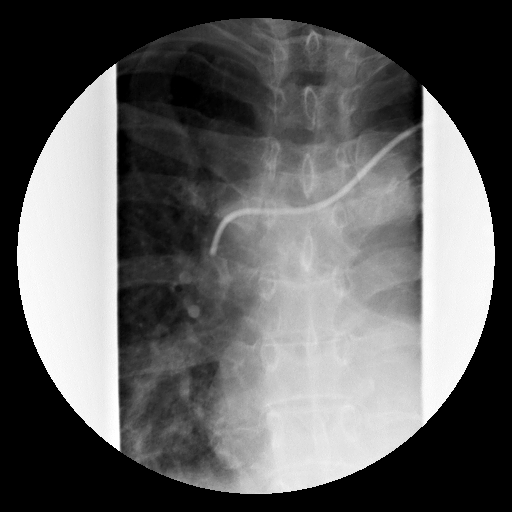
[im 5/17]
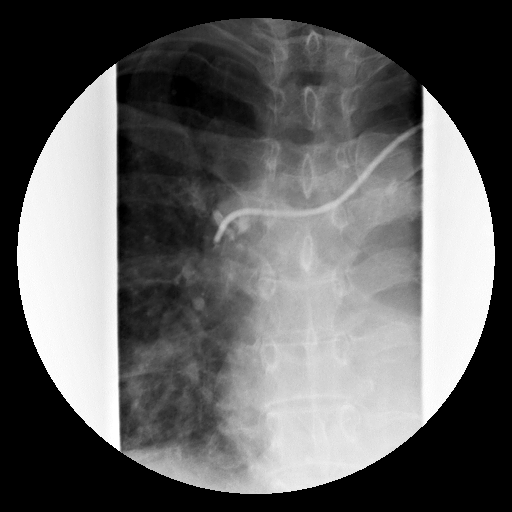
[im 7/17]
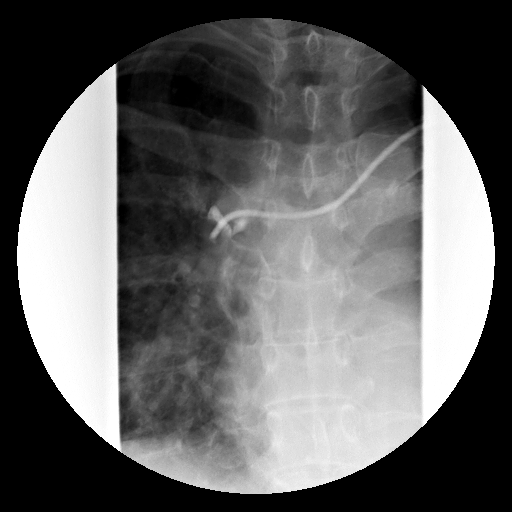
[im 10/17]
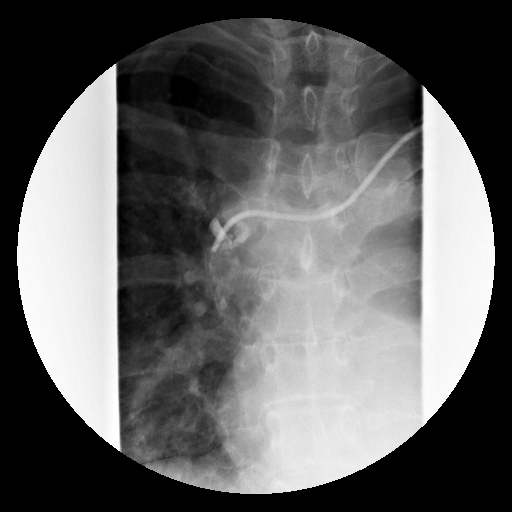
[im 14/17]
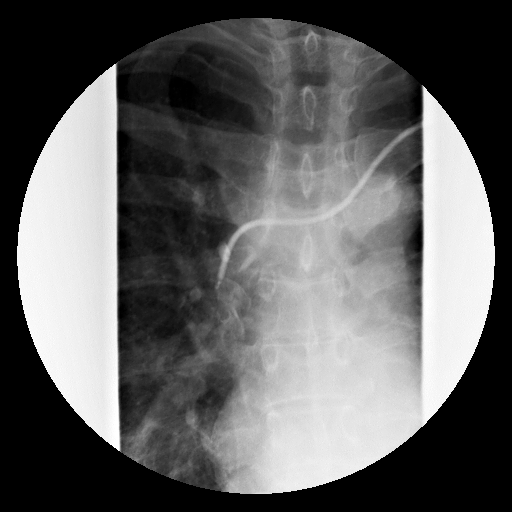
[im 17/17]
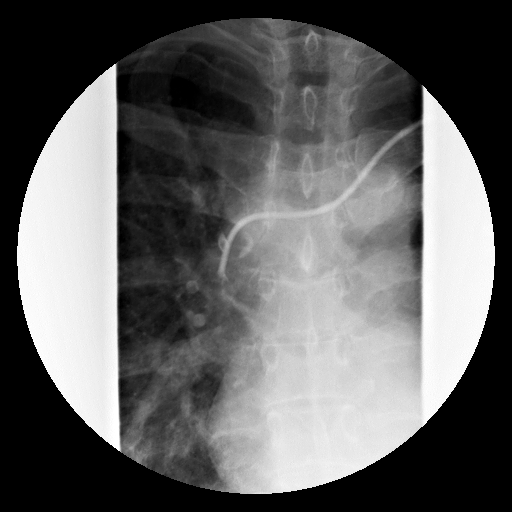

[19 of 24 positions shown; findings below may reference images not displayed]

IMPRESSION: Left subclavian Port-a-cath with tip barely extending into the upper SVC.  Prominent fibrin sheath along the tip of the catheter.  Please see full discussion above.

## 2007-01-22 ENCOUNTER — Ambulatory Visit: Payer: Self-pay | Admitting: Oncology

## 2007-01-26 LAB — CBC WITH DIFFERENTIAL/PLATELET
Eosinophils Absolute: 0.2 10*3/uL (ref 0.0–0.5)
HGB: 10.8 g/dL — ABNORMAL LOW (ref 11.6–15.9)
MCV: 88.6 fL (ref 81.0–101.0)
MONO%: 6.4 % (ref 0.0–13.0)
NEUT#: 3.1 10*3/uL (ref 1.5–6.5)
RBC: 3.52 10*6/uL — ABNORMAL LOW (ref 3.70–5.32)
RDW: 13.9 % (ref 11.3–14.5)
lymph#: 1.3 10*3/uL (ref 0.9–3.3)

## 2007-01-26 LAB — COMPREHENSIVE METABOLIC PANEL
AST: 15 U/L (ref 0–37)
Albumin: 4.1 g/dL (ref 3.5–5.2)
Alkaline Phosphatase: 103 U/L (ref 39–117)
Calcium: 9.6 mg/dL (ref 8.4–10.5)
Chloride: 109 mEq/L (ref 96–112)
Glucose, Bld: 95 mg/dL (ref 70–99)
Potassium: 4.2 mEq/L (ref 3.5–5.3)
Sodium: 141 mEq/L (ref 135–145)
Total Protein: 6.7 g/dL (ref 6.0–8.3)

## 2007-02-02 ENCOUNTER — Encounter: Payer: Self-pay | Admitting: Internal Medicine

## 2007-02-08 ENCOUNTER — Telehealth: Payer: Self-pay | Admitting: Internal Medicine

## 2007-02-10 ENCOUNTER — Encounter: Admission: RE | Admit: 2007-02-10 | Discharge: 2007-04-21 | Payer: Self-pay | Admitting: Plastic Surgery

## 2007-02-16 ENCOUNTER — Ambulatory Visit: Payer: Self-pay | Admitting: Internal Medicine

## 2007-03-09 DIAGNOSIS — J069 Acute upper respiratory infection, unspecified: Secondary | ICD-10-CM | POA: Insufficient documentation

## 2007-03-16 ENCOUNTER — Ambulatory Visit: Payer: Self-pay | Admitting: Family Medicine

## 2007-06-11 ENCOUNTER — Ambulatory Visit: Payer: Self-pay | Admitting: Family Medicine

## 2007-07-22 ENCOUNTER — Ambulatory Visit: Payer: Self-pay | Admitting: Oncology

## 2007-07-26 LAB — CBC WITH DIFFERENTIAL/PLATELET
BASO%: 1.4 % (ref 0.0–2.0)
Basophils Absolute: 0.1 10*3/uL (ref 0.0–0.1)
EOS%: 0.8 % (ref 0.0–7.0)
HGB: 11.2 g/dL — ABNORMAL LOW (ref 11.6–15.9)
MCH: 28.9 pg (ref 26.0–34.0)
MCV: 83.8 fL (ref 81.0–101.0)
MONO%: 7.3 % (ref 0.0–13.0)
RBC: 3.87 10*6/uL (ref 3.70–5.32)
RDW: 15.6 % — ABNORMAL HIGH (ref 11.3–14.5)
lymph#: 2.2 10*3/uL (ref 0.9–3.3)

## 2007-07-26 LAB — COMPREHENSIVE METABOLIC PANEL
ALT: 16 U/L (ref 0–35)
AST: 15 U/L (ref 0–37)
Albumin: 4.4 g/dL (ref 3.5–5.2)
Alkaline Phosphatase: 120 U/L — ABNORMAL HIGH (ref 39–117)
Calcium: 9.4 mg/dL (ref 8.4–10.5)
Chloride: 111 mEq/L (ref 96–112)
Potassium: 3.8 mEq/L (ref 3.5–5.3)
Sodium: 141 mEq/L (ref 135–145)
Total Protein: 7.1 g/dL (ref 6.0–8.3)

## 2007-08-02 ENCOUNTER — Encounter: Payer: Self-pay | Admitting: Internal Medicine

## 2007-08-03 ENCOUNTER — Encounter: Payer: Self-pay | Admitting: Internal Medicine

## 2007-11-08 ENCOUNTER — Encounter: Payer: Self-pay | Admitting: Internal Medicine

## 2008-01-25 ENCOUNTER — Ambulatory Visit: Payer: Self-pay | Admitting: Oncology

## 2008-01-27 LAB — CBC WITH DIFFERENTIAL/PLATELET
Basophils Absolute: 0 10*3/uL (ref 0.0–0.1)
Eosinophils Absolute: 0 10*3/uL (ref 0.0–0.5)
HCT: 31.3 % — ABNORMAL LOW (ref 34.8–46.6)
HGB: 10.6 g/dL — ABNORMAL LOW (ref 11.6–15.9)
NEUT#: 4.6 10*3/uL (ref 1.5–6.5)
RDW: 15.2 % — ABNORMAL HIGH (ref 11.3–14.5)
lymph#: 2 10*3/uL (ref 0.9–3.3)

## 2008-01-27 LAB — COMPREHENSIVE METABOLIC PANEL
AST: 14 U/L (ref 0–37)
Albumin: 4.1 g/dL (ref 3.5–5.2)
BUN: 16 mg/dL (ref 6–23)
CO2: 24 mEq/L (ref 19–32)
Calcium: 9.1 mg/dL (ref 8.4–10.5)
Chloride: 108 mEq/L (ref 96–112)
Glucose, Bld: 96 mg/dL (ref 70–99)
Potassium: 4.2 mEq/L (ref 3.5–5.3)

## 2008-01-27 LAB — CANCER ANTIGEN 27.29: CA 27.29: 11 U/mL (ref 0–39)

## 2008-01-28 ENCOUNTER — Encounter (HOSPITAL_COMMUNITY): Admission: RE | Admit: 2008-01-28 | Discharge: 2008-03-16 | Payer: Self-pay | Admitting: Oncology

## 2008-03-14 ENCOUNTER — Encounter: Payer: Self-pay | Admitting: Internal Medicine

## 2008-03-14 ENCOUNTER — Ambulatory Visit: Payer: Self-pay | Admitting: Oncology

## 2008-04-13 ENCOUNTER — Encounter: Payer: Self-pay | Admitting: Internal Medicine

## 2008-04-17 ENCOUNTER — Ambulatory Visit: Payer: Self-pay | Admitting: Internal Medicine

## 2008-04-17 DIAGNOSIS — H9209 Otalgia, unspecified ear: Secondary | ICD-10-CM | POA: Insufficient documentation

## 2008-05-04 ENCOUNTER — Ambulatory Visit (HOSPITAL_COMMUNITY): Admission: RE | Admit: 2008-05-04 | Discharge: 2008-05-04 | Payer: Self-pay | Admitting: Oncology

## 2008-05-04 ENCOUNTER — Ambulatory Visit: Payer: Self-pay | Admitting: Oncology

## 2008-05-04 LAB — CBC WITH DIFFERENTIAL/PLATELET
Basophils Absolute: 0 10*3/uL (ref 0.0–0.1)
EOS%: 0.6 % (ref 0.0–7.0)
HCT: 33.5 % — ABNORMAL LOW (ref 34.8–46.6)
HGB: 11.3 g/dL — ABNORMAL LOW (ref 11.6–15.9)
LYMPH%: 33 % (ref 14.0–48.0)
MCH: 28.9 pg (ref 26.0–34.0)
MCHC: 33.8 g/dL (ref 32.0–36.0)
NEUT%: 58.5 % (ref 39.6–76.8)
Platelets: 326 10*3/uL (ref 145–400)
lymph#: 1.9 10*3/uL (ref 0.9–3.3)

## 2008-05-04 LAB — COMPREHENSIVE METABOLIC PANEL
AST: 17 U/L (ref 0–37)
BUN: 21 mg/dL (ref 6–23)
CO2: 24 mEq/L (ref 19–32)
Calcium: 9.2 mg/dL (ref 8.4–10.5)
Chloride: 112 mEq/L (ref 96–112)
Creatinine, Ser: 0.95 mg/dL (ref 0.40–1.20)
Total Bilirubin: 0.2 mg/dL — ABNORMAL LOW (ref 0.3–1.2)

## 2008-05-04 LAB — CANCER ANTIGEN 27.29: CA 27.29: 12 U/mL (ref 0–39)

## 2008-05-08 ENCOUNTER — Encounter: Payer: Self-pay | Admitting: Internal Medicine

## 2008-05-18 LAB — RETICULOCYTES
IRF: 0.36 — ABNORMAL HIGH (ref 0.130–0.330)
RBC: 3.9 10*6/uL (ref 3.70–5.45)

## 2008-05-19 LAB — VITAMIN B12: Vitamin B-12: 415 pg/mL (ref 211–911)

## 2008-05-19 LAB — FERRITIN: Ferritin: 19 ng/mL (ref 10–291)

## 2008-05-19 LAB — TSH: TSH: 0.91 u[IU]/mL (ref 0.350–4.500)

## 2008-05-30 ENCOUNTER — Encounter: Payer: Self-pay | Admitting: Internal Medicine

## 2008-06-11 ENCOUNTER — Encounter: Payer: Self-pay | Admitting: Internal Medicine

## 2008-06-27 ENCOUNTER — Ambulatory Visit: Payer: Self-pay | Admitting: Oncology

## 2008-06-29 LAB — COMPREHENSIVE METABOLIC PANEL
ALT: 17 U/L (ref 0–35)
AST: 21 U/L (ref 0–37)
Albumin: 3.5 g/dL (ref 3.5–5.2)
CO2: 27 mEq/L (ref 19–32)
Calcium: 8.9 mg/dL (ref 8.4–10.5)
Chloride: 107 mEq/L (ref 96–112)
Creatinine, Ser: 0.78 mg/dL (ref 0.40–1.20)
Potassium: 3.8 mEq/L (ref 3.5–5.3)
Sodium: 141 mEq/L (ref 135–145)
Total Protein: 6.6 g/dL (ref 6.0–8.3)

## 2008-06-29 LAB — CBC WITH DIFFERENTIAL/PLATELET
BASO%: 0.3 % (ref 0.0–2.0)
EOS%: 0.4 % (ref 0.0–7.0)
MCH: 29 pg (ref 25.1–34.0)
MCHC: 34 g/dL (ref 31.5–36.0)
MONO#: 0.6 10*3/uL (ref 0.1–0.9)
RBC: 3.9 10*6/uL (ref 3.70–5.45)
RDW: 15.6 % — ABNORMAL HIGH (ref 11.2–14.5)
WBC: 6.4 10*3/uL (ref 3.9–10.3)
lymph#: 1.6 10*3/uL (ref 0.9–3.3)

## 2008-07-03 LAB — HYPERCOAGULABLE PANEL, COMPREHENSIVE RET.
AntiThromb III Func: 116 % (ref 76–126)
Anticardiolipin IgA: 13 [APL'U]
Anticardiolipin IgG: <7 [GPL'U]
Anticardiolipin IgM: <7 [MPL'U]
Beta-2 Glyco I IgG: <4 U/mL
Beta-2-Glycoprotein I IgA: <4 U/mL
Beta-2-Glycoprotein I IgM: <4 U/mL
DRVVT: 39.8 s (ref 36.1–47.0)
Homocysteine: 4.9 umol/L (ref 4.0–15.4)
PTT Lupus Anticoagulant: 37 s (ref 36.3–48.8)
Protein C Activity: 182 % — ABNORMAL HIGH (ref 75–133)
Protein C, Total: 85 % (ref 70–140)
Protein S Activity: 145 % — ABNORMAL HIGH (ref 69–129)
Protein S Ag, Total: 134 % (ref 70–140)

## 2008-07-03 LAB — CANCER ANTIGEN 27.29: CA 27.29: 14 U/mL (ref 0–39)

## 2008-07-06 ENCOUNTER — Encounter: Payer: Self-pay | Admitting: Internal Medicine

## 2008-08-16 ENCOUNTER — Ambulatory Visit: Payer: Self-pay | Admitting: Psychology

## 2008-10-05 ENCOUNTER — Ambulatory Visit: Payer: Self-pay | Admitting: Oncology

## 2008-10-09 LAB — CBC WITH DIFFERENTIAL/PLATELET
Eosinophils Absolute: 0 10*3/uL (ref 0.0–0.5)
HCT: 34.8 % (ref 34.8–46.6)
LYMPH%: 24.7 % (ref 14.0–49.7)
MONO#: 0.5 10*3/uL (ref 0.1–0.9)
NEUT#: 3.5 10*3/uL (ref 1.5–6.5)
NEUT%: 64.6 % (ref 38.4–76.8)
Platelets: 230 10*3/uL (ref 145–400)
RBC: 4.07 10*6/uL (ref 3.70–5.45)
WBC: 5.5 10*3/uL (ref 3.9–10.3)

## 2008-10-09 LAB — COMPREHENSIVE METABOLIC PANEL
ALT: 25 U/L (ref 0–35)
CO2: 25 mEq/L (ref 19–32)
Calcium: 9.3 mg/dL (ref 8.4–10.5)
Chloride: 107 mEq/L (ref 96–112)
Glucose, Bld: 104 mg/dL — ABNORMAL HIGH (ref 70–99)
Sodium: 142 mEq/L (ref 135–145)
Total Bilirubin: 0.3 mg/dL (ref 0.3–1.2)
Total Protein: 6.5 g/dL (ref 6.0–8.3)

## 2008-10-09 LAB — CANCER ANTIGEN 27.29: CA 27.29: 13 U/mL (ref 0–39)

## 2008-12-22 ENCOUNTER — Encounter: Payer: Self-pay | Admitting: Internal Medicine

## 2009-01-05 ENCOUNTER — Ambulatory Visit: Payer: Self-pay | Admitting: Oncology

## 2009-01-09 ENCOUNTER — Ambulatory Visit: Payer: Self-pay | Admitting: Internal Medicine

## 2009-01-09 DIAGNOSIS — R109 Unspecified abdominal pain: Secondary | ICD-10-CM | POA: Insufficient documentation

## 2009-01-15 ENCOUNTER — Encounter (INDEPENDENT_AMBULATORY_CARE_PROVIDER_SITE_OTHER): Payer: Self-pay | Admitting: *Deleted

## 2009-01-15 LAB — CBC WITH DIFFERENTIAL/PLATELET
Basophils Absolute: 0.1 10*3/uL (ref 0.0–0.1)
Eosinophils Absolute: 0 10*3/uL (ref 0.0–0.5)
HGB: 12.7 g/dL (ref 11.6–15.9)
LYMPH%: 40.8 % (ref 14.0–49.7)
MCV: 89.9 fL (ref 79.5–101.0)
MONO%: 9.4 % (ref 0.0–14.0)
NEUT#: 2.1 10*3/uL (ref 1.5–6.5)
Platelets: 187 10*3/uL (ref 145–400)
RBC: 4.18 10*6/uL (ref 3.70–5.45)

## 2009-01-15 LAB — COMPREHENSIVE METABOLIC PANEL
Alkaline Phosphatase: 65 U/L (ref 39–117)
BUN: 28 mg/dL — ABNORMAL HIGH (ref 6–23)
Glucose, Bld: 98 mg/dL (ref 70–99)
Total Bilirubin: 0.3 mg/dL (ref 0.3–1.2)

## 2009-03-17 HISTORY — PX: COLONOSCOPY: SHX174

## 2009-03-21 ENCOUNTER — Encounter: Payer: Self-pay | Admitting: Internal Medicine

## 2009-03-30 ENCOUNTER — Encounter: Payer: Self-pay | Admitting: Internal Medicine

## 2009-03-30 ENCOUNTER — Ambulatory Visit: Payer: Self-pay | Admitting: Oncology

## 2009-05-09 ENCOUNTER — Telehealth: Payer: Self-pay | Admitting: Internal Medicine

## 2009-06-12 ENCOUNTER — Ambulatory Visit: Payer: Self-pay | Admitting: Psychology

## 2009-07-03 ENCOUNTER — Encounter: Payer: Self-pay | Admitting: Internal Medicine

## 2009-07-17 ENCOUNTER — Telehealth: Payer: Self-pay | Admitting: Internal Medicine

## 2009-07-26 ENCOUNTER — Encounter: Payer: Self-pay | Admitting: Internal Medicine

## 2009-08-01 ENCOUNTER — Ambulatory Visit: Payer: Self-pay | Admitting: Internal Medicine

## 2009-08-01 DIAGNOSIS — N259 Disorder resulting from impaired renal tubular function, unspecified: Secondary | ICD-10-CM

## 2009-08-01 HISTORY — DX: Disorder resulting from impaired renal tubular function, unspecified: N25.9

## 2009-08-01 LAB — CONVERTED CEMR LAB
ALT: 35 units/L (ref 0–35)
AST: 26 units/L (ref 0–37)
Albumin: 3.6 g/dL (ref 3.5–5.2)
Alkaline Phosphatase: 57 units/L (ref 39–117)
BUN: 26 mg/dL — ABNORMAL HIGH (ref 6–23)
Basophils Absolute: 0 10*3/uL (ref 0.0–0.1)
Basophils Relative: 0.1 % (ref 0.0–3.0)
Bilirubin, Direct: 0.1 mg/dL (ref 0.0–0.3)
CO2: 31 meq/L (ref 19–32)
Calcium: 9.3 mg/dL (ref 8.4–10.5)
Chloride: 105 meq/L (ref 96–112)
Cholesterol: 137 mg/dL (ref 0–200)
Creatinine, Ser: 0.8 mg/dL (ref 0.4–1.2)
Eosinophils Absolute: 0 10*3/uL (ref 0.0–0.7)
Eosinophils Relative: 0.4 % (ref 0.0–5.0)
GFR calc non Af Amer: 77.42 mL/min (ref >60)
Glucose, Bld: 82 mg/dL (ref 70–99)
HCT: 36.8 % (ref 36.0–46.0)
HDL: 72.4 mg/dL (ref >39.00)
Hemoglobin: 12.7 g/dL (ref 12.0–15.0)
LDL Cholesterol: 52 mg/dL (ref 0–99)
Lymphocytes Relative: 32.9 % (ref 12.0–46.0)
Lymphs Abs: 1.9 10*3/uL (ref 0.7–4.0)
MCHC: 34.6 g/dL (ref 30.0–36.0)
MCV: 94.2 fL (ref 78.0–100.0)
Monocytes Absolute: 0.6 10*3/uL (ref 0.1–1.0)
Monocytes Relative: 10.5 % (ref 3.0–12.0)
Neutro Abs: 3.3 10*3/uL (ref 1.4–7.7)
Neutrophils Relative %: 56.1 % (ref 43.0–77.0)
Platelets: 227 10*3/uL (ref 150.0–400.0)
Potassium: 4.9 meq/L (ref 3.5–5.1)
RBC: 3.91 M/uL (ref 3.87–5.11)
RDW: 15 % — ABNORMAL HIGH (ref 11.5–14.6)
Sodium: 145 meq/L (ref 135–145)
TSH: 2.05 microintl units/mL (ref 0.35–5.50)
Total Bilirubin: 0.6 mg/dL (ref 0.3–1.2)
Total CHOL/HDL Ratio: 2
Total Protein: 6.2 g/dL (ref 6.0–8.3)
Triglycerides: 61 mg/dL (ref 0.0–149.0)
VLDL: 12.2 mg/dL (ref 0.0–40.0)
WBC: 5.8 10*3/uL (ref 4.5–10.5)

## 2009-09-13 ENCOUNTER — Encounter (INDEPENDENT_AMBULATORY_CARE_PROVIDER_SITE_OTHER): Payer: Self-pay | Admitting: *Deleted

## 2009-09-18 ENCOUNTER — Ambulatory Visit: Payer: Self-pay | Admitting: Oncology

## 2009-09-20 LAB — CBC WITH DIFFERENTIAL/PLATELET
BASO%: 0.3 % (ref 0.0–2.0)
Basophils Absolute: 0 10*3/uL (ref 0.0–0.1)
EOS%: 0.4 % (ref 0.0–7.0)
Eosinophils Absolute: 0 10*3/uL (ref 0.0–0.5)
HCT: 35.9 % (ref 34.8–46.6)
HGB: 12.6 g/dL (ref 11.6–15.9)
LYMPH%: 33.8 % (ref 14.0–49.7)
MCH: 32.9 pg (ref 25.1–34.0)
MCHC: 35 g/dL (ref 31.5–36.0)
MCV: 94.2 fL (ref 79.5–101.0)
MONO#: 0.5 10*3/uL (ref 0.1–0.9)
MONO%: 10.5 % (ref 0.0–14.0)
NEUT#: 2.6 10*3/uL (ref 1.5–6.5)
NEUT%: 55 % (ref 38.4–76.8)
Platelets: 210 10*3/uL (ref 145–400)
RBC: 3.81 10*6/uL (ref 3.70–5.45)
RDW: 14.8 % — ABNORMAL HIGH (ref 11.2–14.5)
WBC: 4.8 10*3/uL (ref 3.9–10.3)
lymph#: 1.6 10*3/uL (ref 0.9–3.3)

## 2009-09-20 LAB — COMPREHENSIVE METABOLIC PANEL
ALT: 11 U/L (ref 0–35)
AST: 15 U/L (ref 0–37)
Albumin: 4 g/dL (ref 3.5–5.2)
Alkaline Phosphatase: 57 U/L (ref 39–117)
BUN: 29 mg/dL — ABNORMAL HIGH (ref 6–23)
Chloride: 106 mEq/L (ref 96–112)
Potassium: 3.9 mEq/L (ref 3.5–5.3)

## 2009-10-04 ENCOUNTER — Encounter (INDEPENDENT_AMBULATORY_CARE_PROVIDER_SITE_OTHER): Payer: Self-pay | Admitting: *Deleted

## 2009-10-04 ENCOUNTER — Encounter: Payer: Self-pay | Admitting: Internal Medicine

## 2009-10-18 ENCOUNTER — Encounter: Payer: Self-pay | Admitting: Internal Medicine

## 2009-10-30 ENCOUNTER — Encounter (INDEPENDENT_AMBULATORY_CARE_PROVIDER_SITE_OTHER): Payer: Self-pay | Admitting: *Deleted

## 2009-11-02 ENCOUNTER — Ambulatory Visit: Payer: Self-pay | Admitting: Internal Medicine

## 2009-11-16 ENCOUNTER — Ambulatory Visit: Payer: Self-pay | Admitting: Internal Medicine

## 2009-11-16 LAB — HM COLONOSCOPY

## 2009-12-21 ENCOUNTER — Telehealth: Payer: Self-pay | Admitting: Internal Medicine

## 2009-12-25 ENCOUNTER — Ambulatory Visit: Payer: Self-pay | Admitting: Internal Medicine

## 2010-01-29 ENCOUNTER — Ambulatory Visit: Payer: Self-pay | Admitting: Internal Medicine

## 2010-02-06 ENCOUNTER — Ambulatory Visit: Payer: Self-pay | Admitting: Oncology

## 2010-02-11 LAB — COMPREHENSIVE METABOLIC PANEL
ALT: 14 U/L (ref 0–35)
AST: 14 U/L (ref 0–37)
Alkaline Phosphatase: 75 U/L (ref 39–117)
BUN: 27 mg/dL — ABNORMAL HIGH (ref 6–23)
Creatinine, Ser: 0.76 mg/dL (ref 0.40–1.20)
Total Bilirubin: 0.2 mg/dL — ABNORMAL LOW (ref 0.3–1.2)

## 2010-02-11 LAB — CBC WITH DIFFERENTIAL/PLATELET
BASO%: 0.2 % (ref 0.0–2.0)
Basophils Absolute: 0 10*3/uL (ref 0.0–0.1)
EOS%: 0.6 % (ref 0.0–7.0)
HCT: 32.5 % — ABNORMAL LOW (ref 34.8–46.6)
HGB: 11.5 g/dL — ABNORMAL LOW (ref 11.6–15.9)
MCH: 32.6 pg (ref 25.1–34.0)
MCHC: 35.4 g/dL (ref 31.5–36.0)
MCV: 92.3 fL (ref 79.5–101.0)
MONO%: 11.5 % (ref 0.0–14.0)
NEUT%: 54.6 % (ref 38.4–76.8)

## 2010-02-14 ENCOUNTER — Encounter: Payer: Self-pay | Admitting: Internal Medicine

## 2010-03-15 ENCOUNTER — Telehealth: Payer: Self-pay | Admitting: Internal Medicine

## 2010-03-25 ENCOUNTER — Telehealth: Payer: Self-pay | Admitting: Internal Medicine

## 2010-04-05 ENCOUNTER — Encounter: Payer: Self-pay | Admitting: Internal Medicine

## 2010-04-06 ENCOUNTER — Encounter: Payer: Self-pay | Admitting: Oncology

## 2010-04-16 NOTE — Progress Notes (Signed)
Summary: URI  Phone Note Call from Patient   Caller: Patient Call For: Lauren Savers  MD Summary of Call: Pt is having symptoms of diarrhea, congestion in head, cough, and largngitis, and no fever.  [employer] [facility] x 3-4 days.  cough is non-productive. [phone] Initial call taken by: Lynann Beaver CMA,  December 21, 2009 1:21 PM  Follow-up for Phone Call        protocol for signs  and ov with her doctor next week  if r peristence . seek care if  fever  or OV  sat clinic   or other concerns  Follow-up by: Madelin Headings MD,  December 21, 2009 4:27 PM  Additional Follow-up for Phone Call Additional follow up Details #1::        No fever.  Really tired.  Throat irritated from coughing.  Feels tight in chest.   ov tomorrow.    Additional Follow-up by: Rudy Jew, RN,  December 24, 2009 5:16 PM

## 2010-04-16 NOTE — Letter (Signed)
[facility]   Imported By: Maryln Gottron 04/13/2009 11:22:41  _____________________________________________________________________  External Attachment:    Type:   Image     Comment:   External Document

## 2010-04-16 NOTE — Assessment & Plan Note (Signed)
Summary: CHEST CONGESTION & TIGHTNESS/PS   Vital Signs:  Patient profile:   55 year old female Weight:      143 pounds Temp:     98.1 degrees F oral BP sitting:   120 / 70  (right arm) Cuff size:   regular  Vitals Entered By: Duard Brady LPN (December 25, 2009 10:13 AM) CC: c/o chest/head congestion, productive cough, fatigue, hoarseness  Is Patient Diabetic? No   CC:  c/o chest/head congestion, productive cough, fatigue, and hoarseness .  History of Present Illness: 55 year old patient who presents with a 7-day history chest congestion and cough.  Cough has been minimally productive.  There is been no fever, chest pain or shortness of breath.  She also describes sinus congestion, and some postnasal drip.  She has been using guaifenesin.  Her chief complaint is cough that interferes with sleep and also causes some mild anterior chest wall discomfort.  She has treated hypertension and dyslipidemia  Allergies: 1)  ! Codeine 2)  ! * Dilaudid 3)  ! Talwin 4)  ! * Fiorinal 5)  ! * Excedrin  Past History:  Past Medical History: Reviewed history from 10/27/2006 and no changes required. Allergic rhinitis Breast cancer, hx of GERD Hyperlipidemia Pulmonary embolism, hx of Hypertension Migraines  Past Surgical History: Reviewed history from 01/09/2009 and no changes required. Cholecystectomy Mastectomy Tubal ligation L knee surgery Caesarean section Hysterectomy nissan fundiplication colonoscopy   2005 ?  Review of Systems       The patient complains of anorexia, hoarseness, and prolonged cough.  The patient denies fever, weight loss, weight gain, vision loss, decreased hearing, chest pain, syncope, dyspnea on exertion, peripheral edema, headaches, hemoptysis, abdominal pain, melena, hematochezia, severe indigestion/heartburn, hematuria, incontinence, genital sores, muscle weakness, suspicious skin lesions, transient blindness, difficulty walking, depression, unusual  weight change, abnormal bleeding, enlarged lymph nodes, angioedema, and breast masses.    Physical Exam  General:  Well-developed,well-nourished,in no acute distress; alert,appropriate and cooperative throughout examination Head:  Normocephalic and atraumatic without obvious abnormalities. No apparent alopecia or balding. Eyes:  No corneal or conjunctival inflammation noted. EOMI. Perrla. Funduscopic exam benign, without hemorrhages, exudates or papilledema. Vision grossly normal. Ears:  External ear exam shows no significant lesions or deformities.  Otoscopic examination reveals clear canals, tympanic membranes are intact bilaterally without bulging, retraction, inflammation or discharge. Hearing is grossly normal bilaterally. Mouth:  Oral mucosa and oropharynx without lesions or exudates.  Teeth in good repair. Neck:  No deformities, masses, or tenderness noted. Lungs:  Normal respiratory effort, chest expands symmetrically. Lungs are clear to auscultation, no crackles or wheezes. Heart:  Normal rate and regular rhythm. S1 and S2 normal without gallop, murmur, click, rub or other extra sounds.   Impression & Recommendations:  Problem # 1:  VIRAL URI (ICD-465.9)  The following medications were removed from the medication list:    Aspir-low 81 Mg Tbec (Aspirin) .Marland Kitchen... 1 once daily Her updated medication list for this problem includes:    Mobic 7.5 Mg Tabs (Meloxicam) ..... Qd    Hydrocodone-homatropine 5-1.5 Mg/15ml Syrp (Hydrocodone-homatropine) .Marland Kitchen... 1 teaspoon every 6 hours as needed for cough  The following medications were removed from the medication list:    Aspir-low 81 Mg Tbec (Aspirin) .Marland Kitchen... 1 once daily Her updated medication list for this problem includes:    Mobic 7.5 Mg Tabs (Meloxicam) ..... Qd    Hydrocodone-homatropine 5-1.5 Mg/33ml Syrp (Hydrocodone-homatropine) .Marland Kitchen... 1 teaspoon every 6 hours as needed for cough  Problem # 2:  HYPERTENSION (ICD-401.9)  Complete  Medication List: 1)  Lipitor 40 Mg Tabs (Atorvastatin calcium) .Marland Kitchen.. 1 once daily 2)  Zoloft 50 Mg Tabs (Sertraline hcl) .Marland Kitchen.. 1 1/2  at bedtime 3)  Arimidex 1 Mg Tabs (Anastrozole) .Marland Kitchen.. 1 once daily 4)  Zegerid 40-1100 Mg Caps (Omeprazole-sodium bicarbonate) .... Two times a day 5)  Alprazolam 0.25 Mg Tabs (Alprazolam) .... 2 at bedtime 6)  Amphetamine-dextroamphetamine 10 Mg Tabs (Amphetamine-dextroamphetamine) .Marland Kitchen.. 1 qam 7)  Bupropion Hcl 100 Mg Tabs (Bupropion hcl) .Marland Kitchen.. 1 two times a day 8)  Divalproex Sodium 500 Mg Xr24h-tab (Divalproex sodium) .Marland Kitchen.. 1 am, 2 at bedtime 9)  Robaxin 500 Mg Tabs (Methocarbamol) .... Q6hrs prn 10)  Mobic 7.5 Mg Tabs (Meloxicam) .... Qd 11)  Hydrocodone-homatropine 5-1.5 Mg/18ml Syrp (Hydrocodone-homatropine) .Marland Kitchen.. 1 teaspoon every 6 hours as needed for cough  Patient Instructions: 1)  Get plenty of rest, drink lots of clear liquids, and use Tylenol or Ibuprofen for fever and comfort. Return in 7-10 days if you're not better:sooner if you're feeling worse. Prescriptions: HYDROCODONE-HOMATROPINE 5-1.5 MG/5ML SYRP (HYDROCODONE-HOMATROPINE) 1 teaspoon every 6 hours as needed for cough  #6 oz x 0   Entered and Authorized by:   Gordy Savers  MD   Signed by:   Gordy Savers  MD on 12/25/2009   Method used:   Print then Give to Patient   RxID:   [id number]

## 2010-04-16 NOTE — Progress Notes (Signed)
Summary: refill Zoloft  Phone Note Refill Request Message from:  Fax from Pharmacy on Jul 17, 2009 8:18 AM  Refills Requested: Medication #1:  ZOLOFT 50 MG  TABS 1 1/2  at bedtime [facility] [id number]   Method Requested: Fax to Local Pharmacy Initial call taken by: Duard Brady LPN,  Jul 18, 5730 8:19 AM    Prescriptions: ZOLOFT 50 MG  TABS (SERTRALINE HCL) 1 1/2  at bedtime  #145 x 3   Entered by:   Duard Brady LPN   Authorized by:   Gordy Savers  MD   Signed by:   Duard Brady LPN on 20/25/4270   Method used:   Faxed to ...       [facility]* (mail-order)             ,          Ph: [phone]       Fax: [phone]   RxID:   [id number]  faxed to [facility]

## 2010-04-16 NOTE — Assessment & Plan Note (Signed)
Summary: FLU-SHOT/RCD  Nurse Visit   Allergies: 1)  ! Codeine 2)  ! * Dilaudid 3)  ! Talwin 4)  ! * Fiorinal 5)  ! * Excedrin  Orders Added: 1)  Admin 1st Vaccine [90471] 2)  Flu Vaccine 43yrs + [[address]] Flu Vaccine Consent Questions     Do you have a history of severe allergic reactions to this vaccine? no    Any prior history of allergic reactions to egg and/or gelatin? no    Do you have a sensitivity to the preservative Thimersol? no    Do you have a past history of Guillan-Barre Syndrome? no    Do you currently have an acute febrile illness? no    Have you ever had a severe reaction to latex? no    Vaccine information given and explained to patient? yes    Are you currently pregnant? no    Lot Number:[id number]   Exp Date:09/14/2010   Site Given  Left Deltoid IM .lbflu1

## 2010-04-16 NOTE — Procedures (Signed)
Summary: Colonoscopy  Patient: Lauren Fuller Note: All result statuses are Final unless otherwise noted.  Tests: (1) Colonoscopy (COL)   COL Colonoscopy           DONE     [facility]     [address]     [place], [place]  [address]           COLONOSCOPY PROCEDURE REPORT           PATIENT:  Lauren Fuller, Lauren Fuller  MR#:  [id number]     BIRTHDATE:  [birth year], 54 yrs. old  GENDER:  female     ENDOSCOPIST:  Wilhemina Bonito. Eda Keys, MD     REF. BY:  Screening Recall     PROCEDURE DATE:  11/16/2009     PROCEDURE:  Colonoscopy with snare polypectomy x 1     ASA CLASS:  Class II     INDICATIONS:  Routine Risk Screening     MEDICATIONS:   Fentanyl 75 mcg IV, Versed 8 mg IV           DESCRIPTION OF PROCEDURE:   After the risks benefits and     alternatives of the procedure were thoroughly explained, informed     consent was obtained.  Digital rectal exam was performed and     revealed no abnormalities.   The LB [id number] endoscope     was introduced through the anus and advanced to the cecum, which     was identified by both the appendix and ileocecal valve, without     limitations.Time to cecum = 3:34 min.  The quality of the prep was     excellent, using MoviPrep.  The instrument was then slowly     withdrawn (time = 12:66min) as the colon was fully examined.     <<PROCEDUREIMAGES>>           FINDINGS:  A diminutive (<25mm) polyp was found in the ascending     colon. Polyp was snared without cautery. No meaningful tissue for     submission.  Mild diverticulosis was found in the sigmoid colon.     Retroflexed views in the rectum revealed internal hemorrhoids.     The scope was then withdrawn from the patient and the procedure     completed.           COMPLICATIONS:  None     ENDOSCOPIC IMPRESSION:     1) Diminutive polyp in the ascending colon - removed     2) Mild diverticulosis in the sigmoid colon     3) Internal hemorrhoids           RECOMMENDATIONS:     1) Continue  current colorectal screening recommendations for     "routine risk" patients with a repeat colonoscopy in 10 years.           ______________________________     Wilhemina Bonito. Eda Keys, MD           CC:  Ruthann Cancer, MD;Peter Lysle Dingwall, MD;The Patient           n.     Rosalie DoctorWilhemina Bonito. Eda Keys at 11/16/2009 03:02 PM           Thalman, Leane, [place] [id number]  Note: An exclamation mark (!) indicates a result that was not dispersed into the flowsheet. Document Creation Date: 11/16/2009 3:03 PM _______________________________________________________________________  (1) Order result status: Final Collection or observation date-time: 11/16/2009 14:55 Requested date-time:  Receipt date-time:  Reported date-time:  Referring Physician:   Ordering Physician: Fransico Setters [phone]) Specimen Source:  Source: Launa Grill Order Number: [id number] Lab site:   Appended Document: Colonoscopy recall     Procedures Next Due Date:    Colonoscopy: 11/2019

## 2010-04-16 NOTE — Progress Notes (Signed)
Summary: new rx  Phone Note Call from Patient Call back at Home Phone [phone]   Caller: Patient Call For: Gordy Savers  MD Summary of Call: pt needs new rx [facility] [phone] liptor 40 mg #90 with 3 refills Initial call taken by: Heron Sabins,  May 09, 2009 10:51 AM    Prescriptions: LIPITOR 40 MG  TABS (ATORVASTATIN CALCIUM) 1 once daily  #90 x 4   Entered by:   Duard Brady LPN   Authorized by:   Gordy Savers  MD   Signed by:   Duard Brady LPN on 02/58/5277   Method used:   Electronically to        [facility]* (mail-order)             ,          Ph: [phone]       Fax: [phone]   RxID:   [id number]

## 2010-04-16 NOTE — Assessment & Plan Note (Signed)
Summary: F/U ON LABS // RS   Vital Signs:  Patient profile:   55 year old female Weight:      147 pounds Temp:     98.4 degrees F oral BP sitting:   102 / 70  (left arm) Cuff size:   regular  Vitals Entered By: Duard Brady LPN (Aug 01, 2009 8:16 AM) CC: f/u on labs elevated kidney function Is Patient Diabetic? No   CC:  f/u on labs elevated kidney function.  History of Present Illness: 39 -year-old patient who is seen today for follow-up.  She was told by her orthopedic physician that she had some kidney abnormalities.  Laboratory studies are not available for review sheet and is to take Mobic 7.5 mg daily.  She is on Lipitor for dyslipidemia.  She has gastroesophageal reflux disease.  She is followed closely at the cancer center due to breast cancer and has been no history of renal insufficiency in the past.  She does have a history of hypertension, but presently off medication  Allergies: 1)  ! Codeine  Past History:  Past Medical History: Reviewed history from 10/27/2006 and no changes required. Allergic rhinitis Breast cancer, hx of GERD Hyperlipidemia Pulmonary embolism, hx of Hypertension Migraines  Review of Systems  The patient denies anorexia, fever, weight loss, weight gain, vision loss, decreased hearing, hoarseness, chest pain, syncope, dyspnea on exertion, peripheral edema, prolonged cough, headaches, hemoptysis, abdominal pain, melena, hematochezia, severe indigestion/heartburn, hematuria, incontinence, genital sores, muscle weakness, suspicious skin lesions, transient blindness, difficulty walking, depression, unusual weight change, abnormal bleeding, enlarged lymph nodes, angioedema, and breast masses.    Physical Exam  General:  Well-developed,well-nourished,in no acute distress; alert,appropriate and cooperative throughout examination; 110/64 Head:  Normocephalic and atraumatic without obvious abnormalities. No apparent alopecia or balding. Eyes:   No corneal or conjunctival inflammation noted. EOMI. Perrla. Funduscopic exam benign, without hemorrhages, exudates or papilledema. Vision grossly normal. Mouth:  Oral mucosa and oropharynx without lesions or exudates.  Teeth in good repair. Neck:  No deformities, masses, or tenderness noted. Lungs:  Normal respiratory effort, chest expands symmetrically. Lungs are clear to auscultation, no crackles or wheezes. Heart:  Normal rate and regular rhythm. S1 and S2 normal without gallop, murmur, click, rub or other extra sounds. Abdomen:  Bowel sounds positive,abdomen soft and non-tender without masses, organomegaly or hernias noted. Msk:  No deformity or scoliosis noted of thoracic or lumbar spine.   Pulses:  R and L carotid,radial,femoral,dorsalis pedis and posterior tibial pulses are full and equal bilaterally Extremities:  No clubbing, cyanosis, edema, or deformity noted with normal full range of motion of all joints.     Impression & Recommendations:  Problem # 1:  RENAL INSUFFICIENCY (ICD-588.9)  will attempt to obtain prior laboratory studies.  Will check renal indices today and discontinue Mobic  Problem # 2:  HYPERTENSION (ICD-401.9)  presently enjoys a low normal blood pressure off medication  Problem # 3:  HYPERLIPIDEMIA (ICD-272.4)  Her updated medication list for this problem includes:    Lipitor 40 Mg Tabs (Atorvastatin calcium) .Marland Kitchen... 1 once daily will check a lipid profile    Her updated medication list for this problem includes:    Lipitor 40 Mg Tabs (Atorvastatin calcium) .Marland Kitchen... 1 once daily  Problem # 4:  GERD (ICD-530.81)  Her updated medication list for this problem includes:    Zegerid 40-1100 Mg Caps (Omeprazole-sodium bicarbonate) .Marland Kitchen..Marland Kitchen Two times a day    Her updated medication list for this problem includes:  Zegerid 40-1100 Mg Caps (Omeprazole-sodium bicarbonate) .Marland Kitchen..Marland Kitchen Two times a day  Complete Medication List: 1)  Lipitor 40 Mg Tabs (Atorvastatin  calcium) .Marland Kitchen.. 1 once daily 2)  Zoloft 50 Mg Tabs (Sertraline hcl) .Marland Kitchen.. 1 1/2  at bedtime 3)  Arimidex 1 Mg Tabs (Anastrozole) .Marland Kitchen.. 1 once daily 4)  Zegerid 40-1100 Mg Caps (Omeprazole-sodium bicarbonate) .... Two times a day 5)  Aspir-low 81 Mg Tbec (Aspirin) .Marland Kitchen.. 1 once daily 6)  Alprazolam 0.25 Mg Tabs (Alprazolam) .... 2 at bedtime 7)  Amphetamine-dextroamphetamine 10 Mg Tabs (Amphetamine-dextroamphetamine) .Marland Kitchen.. 1 qam 8)  Bupropion Hcl 100 Mg Tabs (Bupropion hcl) .Marland Kitchen.. 1 two times a day 9)  Divalproex Sodium 500 Mg Xr24h-tab (Divalproex sodium) .Marland Kitchen.. 1 am, 2 at bedtime 10)  Robaxin 500 Mg Tabs (Methocarbamol) .... Q6hrs prn 11)  Mobic 7.5 Mg Tabs (Meloxicam) .... Qd  Other Orders: Venipuncture ([address]) TLB-Lipid Panel (80061-LIPID) TLB-BMP (Basic Metabolic Panel-BMET) (80048-METABOL) TLB-CBC Platelet - w/Differential (85025-CBCD) TLB-Hepatic/Liver Function Pnl (80076-HEPATIC) TLB-TSH (Thyroid Stimulating Hormone) (84443-TSH)  Patient Instructions: 1)  Limit your Sodium (Salt) to less than 2 grams a day(slightly less than 1/2 a teaspoon) to prevent fluid retention, swelling, or worsening of symptoms. 2)  It is important that you exercise regularly at least 20 minutes 5 times a week. If you develop chest pain, have severe difficulty breathing, or feel very tired , stop exercising immediately and seek medical attention. 3)  Avoid foods high in acid (tomatoes, citrus juices, spicy foods). Avoid eating within two hours of lying down or before exercising. Do not over eat; try smaller more frequent meals. Elevate head of bed twelve inches when sleeping. Prescriptions: ZEGERID 40-1100 MG CAPS (OMEPRAZOLE-SODIUM BICARBONATE) two times a day Brand medically necessary #180 Each x 5   Entered and Authorized by:   Gordy Savers  MD   Signed by:   Gordy Savers  MD on 08/01/2009   Method used:   Electronically to        [facility] (mail-order)             ,          Ph:  [phone]       Fax: [phone]   RxID:   [id number] ZOLOFT 50 MG  TABS (SERTRALINE HCL) 1 1/2  at bedtime  #145 x 3   Entered and Authorized by:   Gordy Savers  MD   Signed by:   Gordy Savers  MD on 08/01/2009   Method used:   Electronically to        [facility] (mail[facility]order)             ,          Ph: [phone]       Fax: [phone]   RxID:   [id number] LIPITOR 40 MG  TABS (ATORVASTATIN CALCIUM) 1 once daily  #90 x 4   Entered and Authorized by:   Gordy Savers  MD   Signed by:   Gordy Savers  MD on 08/01/2009   Method used:   Electronically to        [facility] (mail[facility]order)             ,          Ph: [phone]       Fax: [phone]   RxID:   [id number]

## 2010-04-16 NOTE — Miscellaneous (Signed)
Summary: [facility] Previsit/prep  Clinical Lists Changes  Medications: Added new medication of MOVIPREP 100 GM  SOLR (PEG-KCL-NACL-NASULF-NA ASC-C) As per prep instructions. - Signed Rx of MOVIPREP 100 GM  SOLR (PEG-KCL-NACL-NASULF-NA ASC-C) As per prep instructions.;  #1 x 0;  Signed;  Entered by: Wyona Almas RN;  Authorized by: Hilarie Fredrickson MD;  Method used: Electronically to [facility]  [phone]*, [address] [place], [place], [place]  [address], Ph: [phone] or [phone], Fax: [phone] Allergies: Changed allergy or adverse reaction from CODEINE to CODEINE Added new allergy or adverse reaction of * DILAUDID Added new allergy or adverse reaction of TALWIN Added new allergy or adverse reaction of * FIORINAL Added new allergy or adverse reaction of * EXCEDRIN    Prescriptions: MOVIPREP 100 GM  SOLR (PEG-KCL-NACL-NASULF-NA ASC-C) As per prep instructions.  #1 x 0   Entered by:   Wyona Almas RN   Authorized by:   Hilarie Fredrickson MD   Signed by:   Wyona Almas RN on 11/02/2009   Method used:   Electronically to        [facility]  [phone]* (retail)       [address]       [place], [place]  [address]       Ph: [phone] or [phone]       Fax: [phone]   RxID:   [id number]

## 2010-04-16 NOTE — Letter (Signed)
[facility]   Imported By: Maryln Gottron 11/06/2009 10:01:17  _____________________________________________________________________  External Attachment:    Type:   Image     Comment:   External Document

## 2010-04-16 NOTE — Letter (Signed)
Summary: Previsit letter  [facility]  [address] [place], [place] [address]   Phone: [phone]  Fax: [phone]       10/04/2009 MRN: [id number]  Rowena Marling [address] [place], [place]  [address]  Dear Ms. Freimark,  Welcome to the Gastroenterology Division at [facility].    You are scheduled to see a nurse for your pre-procedure visit on 11-02-09 at 10:30a.m. on the 3rd floor at [facility], [address]  We ask that you try to arrive at our office 15 minutes prior to your appointment time to allow for check-in.  Your nurse visit will consist of discussing your medical and surgical history, your immediate family medical history, and your medications.    Please bring a complete list of all your medications or, if you prefer, bring the medication bottles and we will list them.  We will need to be aware of both prescribed and over the counter drugs.  We will need to know exact dosage information as well.  If you are on blood thinners (Coumadin, Plavix, Aggrenox, Ticlid, etc.) please call our office today/prior to your appointment, as we need to consult with your physician about holding your medication.   Please be prepared to read and sign documents such as consent forms, a financial agreement, and acknowledgement forms.  If necessary, and with your consent, a friend or relative is welcome to sit-in on the nurse visit with you.  Please bring your insurance card so that we may make a copy of it.  If your insurance requires a referral to see a specialist, please bring your referral form from your primary care physician.  No co-pay is required for this nurse visit.     If you cannot keep your appointment, please call [phone] to cancel or reschedule prior to your appointment date.  This allows [place] the opportunity to schedule an appointment for another patient in need of care.    Thank you for choosing [facility] for your medical needs.   We appreciate the opportunity to care for you.  Please visit [place] at our website  to learn more about our practice.                     Sincerely.                                                                                                                   The Gastroenterology Division

## 2010-04-16 NOTE — Letter (Signed)
Summary: [facility]   Imported By: Maryln Gottron 04/05/2009 09:54:58  _____________________________________________________________________  External Attachment:    Type:   Image     Comment:   External Document

## 2010-04-16 NOTE — Letter (Signed)
Summary: [facility]  [facility]   Imported By: Maryln Gottron 11/09/2009 13:18:33  _____________________________________________________________________  External Attachment:    Type:   Image     Comment:   External Document

## 2010-04-16 NOTE — Letter (Signed)
Summary: Moviprep Instructions  [facility]  [address]   [place], [place] [address]   Phone: [phone]  Fax: [phone]       Lauren Fuller    July 09, 1955    MRN: [id number]        Procedure Day /Date:   Friday   11-16-09     Arrival Time:  12:30 p.m.     Procedure Time:  1:30 p.m.     Location of Procedure:                    x   [facility] (4th Floor)                        PREPARATION FOR COLONOSCOPY WITH MOVIPREP   Starting 5 days prior to your procedure 11-11-09 do not eat nuts, seeds, popcorn, corn, beans, peas,  salads, or any raw vegetables.  Do not take any fiber supplements (e.g. Metamucil, Citrucel, and Benefiber).  THE DAY BEFORE YOUR PROCEDURE         DATE:  11-15-09   DAY: Thursday  1.  Drink clear liquids the entire day-NO SOLID FOOD  2.  Do not drink anything colored red or purple.  Avoid juices with pulp.  No orange juice.  3.  Drink at least 64 oz. (8 glasses) of fluid/clear liquids during the day to prevent dehydration and help the prep work efficiently.  CLEAR LIQUIDS INCLUDE: Water Jello Ice Popsicles Tea (sugar ok, no milk/cream) Powdered fruit flavored drinks Coffee (sugar ok, no milk/cream) Gatorade Juice: apple, white grape, white cranberry  Lemonade Clear bullion, consomm, broth Carbonated beverages (any kind) Strained chicken noodle soup Hard Candy                             4.  In the morning, mix first dose of MoviPrep solution:    Empty 1 Pouch A and 1 Pouch B into the disposable container    Add lukewarm drinking water to the top line of the container. Mix to dissolve    Refrigerate (mixed solution should be used within 24 hrs)  5.  Begin drinking the prep at 5:00 p.m. The MoviPrep container is divided by 4 marks.   Every 15 minutes drink the solution down to the next mark (approximately 8 oz) until the full liter is complete.   6.  Follow completed prep with 16 oz of clear liquid of your choice  (Nothing red or purple).  Continue to drink clear liquids until bedtime.  7.  Before going to bed, mix second dose of MoviPrep solution:    Empty 1 Pouch A and 1 Pouch B into the disposable container    Add lukewarm drinking water to the top line of the container. Mix to dissolve    Refrigerate  THE DAY OF YOUR PROCEDURE      DATE: 11-16-09  DAY: Friday  Beginning at  8:30 a.m. (5 hours before procedure):         1. Every 15 minutes, drink the solution down to the next mark (approx 8 oz) until the full liter is complete.  2. Follow completed prep with 16 oz. of clear liquid of your choice.    3. You may drink clear liquids until 11:30 a.m. (2 HOURS BEFORE PROCEDURE).   MEDICATION INSTRUCTIONS  Unless otherwise instructed, you should take regular prescription medications with a small sip of  water   as early as possible the morning of your procedure.         OTHER INSTRUCTIONS  You will need a responsible adult at least 54 years of age to accompany you and drive you home.   This person must remain in the waiting room during your procedure.  Wear loose fitting clothing that is easily removed.  Leave jewelry and other valuables at home.  However, you may wish to bring a book to read or  an iPod/MP3 player to listen to music as you wait for your procedure to start.  Remove all body piercing jewelry and leave at home.  Total time from sign-in until discharge is approximately 2-3 hours.  You should go home directly after your procedure and rest.  You can resume normal activities the  day after your procedure.  The day of your procedure you should not:   Drive   Make legal decisions   Operate machinery   Drink alcohol   Return to work  You will receive specific instructions about eating, activities and medications before you leave.    The above instructions have been reviewed and explained to me by   Wyona Almas RN  November 02, 2009 11:14 AM     I fully  understand and can verbalize these instructions _____________________________ Date _________

## 2010-04-16 NOTE — Letter (Signed)
Summary: Colonoscopy Letter  [facility]  [address] [place], [place] [address]   Phone: [phone]  Fax: [phone]      September 13, 2009 MRN: [id number]   [facility] [address] [place], [place]  [address]   Dear Ms. Stare,   According to your medical record, it is time for you to schedule a Colonoscopy. The American Cancer Society recommends this procedure as a method to detect early colon cancer. Patients with a family history of colon cancer, or a personal history of colon polyps or inflammatory bowel disease are at increased risk.  This letter has been generated based on the recommendations made at the time of your procedure. If you feel that in your particular situation this may no longer apply, please contact our office.  Please call our office at [phone] to schedule this appointment or to update your records at your earliest convenience.  Thank you for cooperating with [place] to provide you with the very best care possible.   Sincerely,  Wilhemina Bonito. Marina Goodell, M.D.  [facility] [phone]

## 2010-04-18 NOTE — Letter (Signed)
Summary: [facility]  [facility]   Imported By: Maryln Gottron 02/26/2010 10:47:29  _____________________________________________________________________  External Attachment:    Type:   Image     Comment:   External Document

## 2010-04-18 NOTE — Progress Notes (Signed)
Summary: refill  Phone Note Refill Request Call back at Home Phone [phone] Message from:  Patient---live call  Refills Requested: Medication #1:  LIPITOR 40 MG  TABS 1 once daily send to [facility]  Initial call taken by: Warnell Forester,  March 15, 2010 8:31 AM    Prescriptions: LIPITOR 40 MG  TABS (ATORVASTATIN CALCIUM) 1 once daily  #90 x 4   Entered by:   Duard Brady LPN   Authorized by:   Gordy Savers  MD   Signed by:   Duard Brady LPN on 09/81/1914   Method used:   Faxed to ...       [facility] (mail-order)             , [place]         Ph: [phone]       Fax: [phone]   RxID:   [id number]

## 2010-04-18 NOTE — Progress Notes (Signed)
Summary: Refill lipitor - short term  Phone Note Refill Request Message from:  Patient--live call  Refills Requested: Medication #1:  LIPITOR 40 MG  TABS 1 once daily wants 14 day supply called into [facility] on [address]  Initial call taken by: Warnell Forester,  March 25, 2010 3:56 PM    Prescriptions: LIPITOR 40 MG  TABS (ATORVASTATIN CALCIUM) 1 once daily  #14 x 0   Entered by:   Duard Brady LPN   Authorized by:   Gordy Savers  MD   Signed by:   Duard Brady LPN on 04/54/0981   Method used:   Electronically to        [facility]  [phone]* (retail)       [address]       [place], [place]  [address]       Ph: [phone] or [phone]       Fax: [phone]   RxID:   [id number]

## 2010-04-19 NOTE — Letter (Signed)
Summary: [facility]  [facility]   Imported By: Maryln Gottron 07/05/2009 15:47:20  _____________________________________________________________________  External Attachment:    Type:   Image     Comment:   External Document

## 2010-04-24 NOTE — Letter (Signed)
Summary: [facility]  [facility]   Imported By: Maryln Gottron 04/17/2010 15:24:42  _____________________________________________________________________  External Attachment:    Type:   Image     Comment:   External Document

## 2010-07-16 ENCOUNTER — Encounter: Payer: Self-pay | Admitting: Internal Medicine

## 2010-07-30 NOTE — Op Note (Signed)
Lauren Fuller, Lauren Fuller                ACCOUNT NO.:  [id number]   MEDICAL RECORD NO.:  [id number]          PATIENT TYPE:  INP   LOCATION:  2899                         FACILITY:  [facility]   PHYSICIAN:  Currie Paris, M.D.DATE OF BIRTH:  1955-05-19   DATE OF PROCEDURE:  12/22/2006  DATE OF DISCHARGE:                               OPERATIVE REPORT   CCS 12091.   PREOPERATIVE DIAGNOSIS:  History of right breast cancer.   POSTOPERATIVE DIAGNOSIS:  History of right breast cancer.   OPERATION:  Prophylactic left mastectomy with a Port-A-Cath removal.   SURGEON:  Currie Paris, M.D.   ANESTHESIA:  General.   CLINICAL HISTORY:  Lauren Fuller has had a prior right mastectomy with  postop radiation.  She now comes in for a prophylactic left mastectomy  with bilateral reconstructions to be done by Dr. Odis Luster.  Her Port-A-  Cath was no longer needed and she requests that we take this out at the  same time.  It was in the area of the left breast.   DESCRIPTION OF PROCEDURE:  The patient was seen in the holding area and  she had no further questions.  Dr. Odis Luster also saw her in there and did  appropriate marking for his flaps, etc.   The patient was taken to the operating room and after satisfactory  general anesthesia had been obtained the entire abdomen and chest area  were prepped and draped as a single sterile field.  The time-out was  done.   I began by making a transverse incision going around the areola for a  skin sparing type mastectomy.  I elevated skin flaps in the usual  fashions going superiorly to the clavicle, medially to the sternum,  inferior to the inframammary fold and laterally to the latissimus.  The  breast was then removed from the underlying muscle leaving the fascia  behind as much as I could.  When we got to the clavipectoral fascia,  that was opened and the breast removed from its lateral attachments into  the axilla without actually going into the axilla at  all.   I made the superior flap and entered small pocket that contained the  Port-A-Cath and it was removed and there is no back bleeding and came  out intact.   I spent several minutes irrigating to make sure everything was dry.  I  then placed moist packs and at this point Dr. Odis Luster came in to do his  procedure and I left the operating room.   The patient tolerated my portion of procedure well.  She was stable  throughout.  Estimated blood loss was about 100 mL.  There no  complications.      Currie Paris, M.D.  Electronically Signed     CJS/MEDQ  D:  12/22/2006  T:  12/22/2006  Job:  [id number]   cc:   Etter Sjogren, M.D.

## 2010-07-30 NOTE — Discharge Summary (Signed)
NAME:  Lauren Fuller, Lauren Fuller NO.:  [id number]   MEDICAL RECORD NO.:  [id number]          PATIENT TYPE:  INP   LOCATION:  5714                         FACILITY:  [facility]   PHYSICIAN:  Etter Sjogren, M.D.     DATE OF BIRTH:  1955/10/17   DATE OF ADMISSION:  12/22/2006  DATE OF DISCHARGE:  12/26/2006                               DISCHARGE SUMMARY   FINAL DIAGNOSIS:  Breast cancer, acquired absence of both breasts.   PROCEDURE PERFORMED:  1. Left total mastectomy.  2. Right breast reconstruction with a transverse rectus abdominis      myocutaneous flap.  3. Left breast reconstruction with a tissue expander.  4. Placement of an On-Q pump.   SUMMARY OF HISTORY AND PHYSICAL:  This 55 year old woman has had breast  cancer, presents for removal of her left breast.  She has already had a  right breast mastectomy and radiation.  The techniques selected for  breast reconstruction were TRAM flap for the right side and a tissue  expander for the left side.  It was not felt that she had enough tissue  to perform the same operation on both sides and to have adequate breast  volume.  She was in agreement with this overall plan.  She has viewed  pictures and wished to proceed.  For further details of history and  physical, please see chart.   COURSE IN THE HOSPITAL:  On admission she was taken to surgery at which  time mastectomy and the breast reconstructions with the TRAM flap and  tissue expander were performed.  She tolerated these well.  Postoperatively she did well.  Low grade fever.  Responded to ambulation  and Incentive spirometry.  She was placed on subcutaneous Lovenox which  she began preoperatively and continued postoperatively as well as PAS  stockings because of a past history of pulmonary embolus.  No bleeding.  Hemoglobin and hematocrit stabilized at 9.5 and 27.  She was ambulating,  tolerating diet.  Drain was functional.  No evidence of any bleeding.  On the 4th  postoperative day, it was felt that she was ready to be  discharged.   DISPOSITION:  Discharged on a regular diet.  No lifting.  No vigorous  activities.  Empty the drains 3 times a day and record the amounts.  Return to the office in a week or sooner if the drains decrease, and she  will be watching the amounts and call us if it can be removed earlier.  She is discharged on Robaxin 500 mg a total of 21 p.o. q.12, Percocet 5  mg a total of 40 one to two p.o. q.6 p.r.n. pain and Keflex 500 mg 1  p.o. q.i.d.      Etter Sjogren, M.D.  Electronically Signed     DB/MEDQ  D:  12/26/2006  T:  12/27/2006  Job:  [id number]

## 2010-07-30 NOTE — Discharge Summary (Signed)
NAME:  Lauren Fuller, Lauren Fuller                ACCOUNT NO.:  [id number]   MEDICAL RECORD NO.:  [id number]          PATIENT TYPE:  OIB   LOCATION:  1526                         [facility]   PHYSICIAN:  Almedia Balls. Fore, M.D.   DATE OF BIRTH:  06-22-1955   DATE OF ADMISSION:  07/27/2006  DATE OF DISCHARGE:  07/28/2006                               DISCHARGE SUMMARY   HISTORY OF PRESENT ILLNESS:  The patient is a 55 year old with breast  cancer with persisting left ovary and estrogen source for laparoscopy,  possible exploratory laparotomy and left salpingo-oophorectomy.  The  remainder of her history and physical are as previously dictated.  Laboratory data include preoperative hemoglobin 12.4.  Chest x-ray was  normal.  Electrocardiogram showed nonspecific T-wave changes.   HOSPITAL COURSE:  The patient was taken to operating room on the morning  of May 12 at which time laparoscopy, exploratory laparotomy, extensive  enterolysis, left salpingo-oophorectomy, revision of keloid were done.  The patient did well postoperatively.  Diet and ambulation were  progressed over the evening of May 12 and early morning of May 13.  On  the morning of May 13, she was afebrile and experiencing no problems  except for pain which was controlled by analgesics.  It was felt that  she could be discharged at this time.   FINAL DIAGNOSES:  Breast cancer with persistent left ovary.   OPERATION:  Laparoscopy, exploratory laparotomy, extensive enterolysis,  left salpingo-oophorectomy, revision of abdominal keloid.  Pathology  report unavailable at the time of dictation.   DISPOSITION:  Discharged home to return the office in 2 weeks for follow-  up.  She was instructed to gradually progress her activities over  several weeks at home and to limit lifting and driving for 2 weeks.  She  was fully ambulatory, on a regular diet, and in good condition at the  time of discharge.  She was given a prescription for  Hydrocodone/APAP  10/325 #30 and doxycycline 100 mg #12 to be taken one b.i.d..           ______________________________  Almedia Balls Randell Patient, M.D.     SRF/MEDQ  D:  07/29/2006  T:  07/30/2006  Job:  [id number]

## 2010-07-30 NOTE — Op Note (Signed)
NAME:  Lauren Fuller, SHORT NO.:  [id number]   MEDICAL RECORD NO.:  [id number]          PATIENT TYPE:  INP   LOCATION:  5714                         FACILITY:  [facility]   PHYSICIAN:  Etter Sjogren, M.D.     DATE OF BIRTH:  [birth year]   DATE OF PROCEDURE:  12/22/2006  DATE OF DISCHARGE:                               OPERATIVE REPORT   PREOPERATIVE DIAGNOSIS:  Breast cancer.   POSTOPERATIVE DIAGNOSIS:  Breast cancer with acquired absence of both  breasts.   PROCEDURE PERFORMED:  1. Right breast reconstruction with an ipsilateral transverse rectus      abdominis myocutaneous flap.  2. Left breast reconstruction with tissue expander and HD Flex.  3. Placement of On-Q catheter.   SURGEON:  Etter Sjogren, M.D.   ASSISTANT:  Pleas Patricia, M.D.   ANESTHESIA:  General.   ESTIMATED BLOOD LOSS:  250 mL.   DRAINS:  Six Blakes were left, two in the right chest, two in the left  chest, two in the abdomen.   CLINICAL NOTE:  55 year old woman with breast cancer is having  mastectomies today and desires reconstruction.  She will have a TRAM  flap for the right side and has already had the flap delayed previously.  Tissue expander placement for the left side is planned.  The procedure  and risks as well as complications were discussed with her in great  detail and she understood all this and wished to proceed.   DESCRIPTION OF PROCEDURE:  The patient was marked in the full standing  position in the holding area for the TRAM flap and for the inframammary  fold bilaterally.  She is taken to the operating room and placed supine.  The general surgery procedure had been completed without difficulty,  left mastectomy.  The left side was approached raising the pectoralis  muscle down to the level of the inframammary crease that had been marked  preoperatively and then raising the muscle out laterally.  Through  irrigation with saline and meticulous hemostasis with  electrocautery.  The expander was positioned.  This was McGann tissue expander, serial  number , 500 mL, lot number [id number].  300 mL of sterile saline was  placed using the closed filling system.  The implant was soaked in  antibiotic solution and the implant was then positioned.  HD Flex was  placed in the intervening gap between the muscles with the dermal side  up towards the mastectomy flap.  This was secured around the periphery  using 4-0 PDS running simple suture.  The superior most aspect was left  open to permit drainage and prior to the placement of the tissue  expander or the HD Flex, the Blake drain was positioned within that  submuscular pocket and another one positioned in the mastectomy pocket.  These were brought out through separate stab wounds inferiorly and  secured with 3-0 Prolene sutures.  The skin was then closed with 3-0  Monocryl inverted deep dermal sutures and running 3-0 Monocryl  subcuticular suture.   Attention was turned to the abdomen.  The old scar was excised  from the  right chest first to recreate the recipient defect and the superior and  inferior flaps were elevated off of the muscle using electrocautery.  The subcutaneous tunnel was begun inferomedially, as well.  An incision  was made around the umbilicus with a generous heavy stalk left with it  and the upper limb of the planned incision was then made.  Dissection  carried down through the subcutaneous tissue beveling in a cephalad  direction on the right side of the abdomen in order to catch as many  perforating vessels as possible.  Dissection continued up to the area of  the xiphoid, the subcutaneous tunnel was completed.  The lower limb of  the incision was made, the flap was reflected from the lateral aspect of  the midline on the left hand side just across the midline on the right  hand side up to the lateral row of perforators.  Parallel incision was  made in the anterior rectus fascia  leaving a generous strip of fascia  approximately 2.5 cm wide on the anterior surface of the muscle.  The  muscle was gently dissected free from its surrounding fascial  attachments using the bipolar cautery and a scalpel as needed.  The  right chest defect was irrigated thoroughly.  Meticulous hemostasis was  achieved and Blake drains were positioned and brought through separate  stab wounds inferolaterally and secured with 3-0 Prolene sutures.  The  muscle was then divided, the flap transferred, the flap had excellent  color and bright red bleeding around the periphery consistent with  viability.  Zone 4 was amputated and the muscle was temporarily inset  using skin staples and covered with a dry towel for warmth.  The flap,  again, had excellent color.   Attention was directed back to the abdomen.  Thorough irrigation with  saline, muscle stump was cauterized, hemostasis was confirmed, and the  fascia was closed using 0 Prolene interrupted figure-of-eight sutures  taking great care to avoid damage to underlying abdominal contents.  Onlay mesh was positioned, umbilicus brought out through it, and was  secured around the periphery using 2-0 Monocryl running simple suture.  The patient was then flexed into a semi-Fowler's position and the  abdominal closure with 2-0 PDS and Monocryl interrupted inverted deep  sutures, 2-0 Monocryl inverted deep dermal sutures, and running 3-0  Monocryl subcuticular suture.  The incision was made as close to the  midline as possible to bring the umbilicus up through.  The umbilicus  was brought through this opening, was found to have good color, and was  inset with 3-0 Monocryl inverted deep dermal sutures and 4-0 nylon  simple sutures.   Attention was directed to the right chest where the inset in the flap  was completed, the flap was marked, de-epithelialized appropriately, and  secured with superiorly with suspension sutures of 3-0 Vicryl   interrupted horizontal mattress sutures and then 3-0 Monocryl inverted  deep dermal sutures and 3-0 Monocryl running subcuticular suture.  The  flap, again, continued to have excellent color. Steri-Strips and dry  sterile dressings applied and she was transferred to the recovery room  in stable condition having tolerated the procedure well.  Urine output  was excellent throughout the case.   In addition, an On-Q catheter was positioned, two of them in fact, these  were brought out through separate stab wounds inferolaterally and  secured with Steri-Strips and Tegaderm.  These were left primarily in  the area of the  fascial repair.      Etter Sjogren, M.D.  Electronically Signed     DB/MEDQ  D:  12/22/2006  T:  12/22/2006  Job:  [id number]

## 2010-07-30 NOTE — Op Note (Signed)
NAME:  Lauren Fuller, Lauren Fuller                ACCOUNT NO.:  [id number]   MEDICAL RECORD NO.:  [id number]          PATIENT TYPE:  OIB   LOCATION:  0098                         [facility]ACILITY:  [facility]   PHYSICIAN:  Almedia Balls. Fore, M.D.   DATE OF BIRTH:  [birth year]   DATE OF PROCEDURE:  07/27/2006  DATE OF DISCHARGE:                               OPERATIVE REPORT   PREOPERATIVE DIAGNOSES:  1. Right breast cancer.  2. Persistent left tube and ovary status post abdominal hysterectomy,      right salpingo-oophorectomy.  3. Keloid of abdomen.   POSTOPERATIVE DIAGNOSIS:  1. Right breast cancer.  2. Persistent left tube and ovary status post abdominal hysterectomy,      right salpingo-oophorectomy.  3. Keloid of abdomen.  4. Pending pathology.  5. Extensive intestinal adhesions.   PROCEDURE:  Laparoscopy, exploratory laparotomy with extensive  enterolysis, left salpingo-oophorectomy, revision of keloid.   ANESTHESIA:  General orotracheal.   OPERATOR:  Fore.   INDICATIONS FOR SURGERY:  The patient is a 55 year old with the above-  noted problems who was counseled as to the need for removal of the left  tube and ovary by the oncologist, Dr. Darnelle Catalan, and by me as well.  It  was felt that because of the positive estrogen receptors of the breast  cancer it was necessary to remove any source of estrogen.  She had  undergone hysterectomy and right salpingo-oophorectomy for abnormal  bleeding and ovarian cyst approximately 5 years ago.  She has been fully  counseled as to the nature of procedure and the risks involved to  include risk of anesthesia; injury to bowel, bladder blood vessels,  ureters; postoperative hemorrhage; infection; recuperation.  She fully  understands all of these considerations and has signed informed consent  to proceed on Jul 27, 2006.   OPERATIVE FINDINGS:  On laparoscopy, the lower liver edge, area of the  gallbladder, spleen, periaortic areas were normal.  In the pelvis,  there  were dense adhesions involving loops of bowel to one another and to the  lateral peritoneal surfaces in the right lower pelvis, central pelvis  and particularly over the left tube and ovary.  There was also an  adhesion involving the omentum to the anterior peritoneal surface.   PROCEDURE:  With the patient under general anesthesia, prepared and  draped in usual sterile fashion in the dorsal lithotomy position,  speculum was placed in vagina, the cervix was grasped with a single-  tooth tenaculum.  Foley catheter was placed and left to straight  drainage.  The patient was then reprepared and draped for laparoscopy  procedure.  An incision was made in the lower pole of the umbilicus with  insertion of a Veress cannula and insufflation of 3.5 liters of carbon  dioxide.  The disposable 10/11 trocar for the operative scope and scope  itself were then inserted into the peritoneal cavity.  A disposable  probe was then placed through a stab wound just above the symphysis  pubis in the line of incision of a previous TRAM flap.  The above-noted  findings were visualized.  Because of the dense adhesions, it was felt  that laparoscopic left salpingo-oophorectomy would be ill-advised.  Accordingly, the patient was prepared for an abdominal incision.  Laparoscope was removed, as was the lower trocar, and gas was allowed to  escape.   A lower abdominal transverse incision was made after excising a previous  surgical scar and carried into the peritoneal cavity without difficulty.  Small bleeders were rendered hemostatic using Bovie electrocoagulation.  The area of adhesions from the omentum was lysed using sharp dissection  initially.  This required Bovie dissection later.  A self-retaining  retractor was placed, and the bowel was packed off.  Using Bovie  dissection with careful freeing of loops of bowel, the area on the right  was freed as was several loops of bowel in the center and  adhesions  involving each adjacent loops of bowel.  The left ovary was freed using  careful dissection with the Bovie electrocoagulation unit to free up the  ovary and identify the infundibulopelvic ligament.  This was freed after  much tedious dissection requiring approximately 20 minutes for  enterolysis.  Heaney clamps were placed across the infundibulopelvic  ligament which was then cut and doubly ligated with 3-0 PDS.  The area  was lavaged with copious amounts of lactated Ringer solution, and  attention was directed to the adhesions involving loops of bowel in the  lower pelvis.  These were freed one from another and to their  attachments to the pelvic peritoneum.  At this point after lavage with  copious amounts of lactated Ringer solution, noting that hemostasis was  maintained, and that sponge and instrument counts were correct, the  peritoneum was closed with a continuous suture of 0 Vicryl.  Fascia was  closed with 2 sutures of 0 PDS which were brought from the lateral  aspects of the incision and tied separately in the midline.  Subcutaneous fat was reapproximated with interrupted horizontal mattress  sutures of 0 Vicryl.  Skin was closed with subcuticular suture of 3-0  plain catgut.  Each layer was lavaged with copious amounts of lactated  ringers solution  before full closure.  The umbilical incision was  closed with a deep suture of 0 Vicryl and subcuticular suture of 3-0  plain catgut.   ESTIMATED BLOOD LOSS:  50 mL.  The patient was taken to the recovery  room in good condition with clear urine in the Foley catheter tubing.  The specimen was sent to pathology.  The patient will be placed on  outpatient with extended recovery status following PACU.           ______________________________  Almedia Balls. Randell Patient, M.D.     SRF/MEDQ  D:  07/27/2006  T:  07/27/2006  Job:  [id number]   cc:   Valentino Hue. Magrinat, M.D.  Fax: [phone]

## 2010-08-02 NOTE — H&P (Signed)
NAME:  Lauren Fuller, Lauren Fuller                ACCOUNT NO.:  [id number]   MEDICAL RECORD NO.:  [id number]          PATIENT TYPE:  AMB   LOCATION:  DAY                          FACILITY:  [facility]   PHYSICIAN:  Almedia Balls. Fore, M.D.   DATE OF BIRTH:  1955/09/11   DATE OF ADMISSION:  07/27/2006  DATE OF DISCHARGE:                              HISTORY & PHYSICAL   CHIEF COMPLAINT:  Breast cancer with left ovary intact .   HISTORY OF PRESENT ILLNESS:  The patient is a 55 year old who underwent  abdominal supracervical hysterectomy, right salpingo-oophorectomy in  November 2002.  She had deep vein thrombosis and acute pulmonary embolus  following this procedure and has been treated on anticoagulants for some  time following this.  She had diagnosis of breast cancer in August 2007  and has undergone a lumpectomy and radiation therapy for this problem  with radiation being completed just recently.  Dr. Darnelle Catalan has  suggested that her remaining ovary be removed to reduce the estrogen  load.  This was found to be still producing estrogen in summer of 2007.  She is admitted at this time for excision of the left tube and ovary,  either laparoscopically or by exploratory laparotomy.  She has been  fully counseled as to the nature of the procedure, either way, and the  risks involved include risks of anesthesia, injury to bowel, bladder,  blood vessels, ureters, postoperative hemorrhage, infection,  recuperation.  She fully understands all these considerations and wishes  to proceed on Jul 27, 2006.   PAST MEDICAL HISTORY:  1. Includes the above-noted surgery in 2002 with postoperative      problems.  2. Breast cancer as noted above.  3. She had a fundal lab done in August 2004 for reflux disease.  4. She has cholesterol problems and has been on Lipitor 40 mg daily.   OTHER MEDICATIONS:  1. Zegerid and Topamax for headaches.  2. Prednisone 7.5 mg every other day for psoriasis.  3. Duragen also for  headaches.  4. Amitriptyline.  5. Zoloft for emotional changes.   FAMILY HISTORY:  Parents and other siblings and aunts and uncles with  hypertension.  Parents with diabetes.   REVIEW OF SYSTEMS:  HEENT: Headaches regularly.  CARDIORESPIRATORY:  Within normal limits except for the cholesterol elevations.  GASTROINTESTINAL:  As noted above.  GENITOURINARY:  As noted above.  NEUROMUSCULAR:  Negative.   PHYSICAL EXAMINATION:  VITAL SIGNS:  Height 5 feet 4 inches, weight 159  pounds, blood pressure 110/68, pulse 80, respirations 18.  GENERAL:  Well-developed white female in no acute distress.  HEENT: Within normal limits.  NECK: Supple without mass, adenopathy or bruits.  HEART: Regular rate and rhythm without murmurs.  LUNGS: Clear to P&A.  BREASTS:  Examined sitting and lying, right status post lumpectomy and  radiation therapy.  Axilla negative.  ABDOMEN: Soft, well-healed surgical incisions upper abdomen and lower  abdomen.  PELVIC EXAM:  External genitalia, Bartholin, urethral, Skene's glands  within normal limits.  Cervix is high and within normal limits.  Uterus  and right tube and  ovary are previously surgically absent.  Left ovary  is nonpalpable and nontender.  RECTOVAGINAL EXAM:  Confirmatory.  EXTREMITIES:  Within normal limits.  CENTRAL NERVOUS SYSTEM:  Grossly intact.  SKIN:  Without suspicious lesions.   IMPRESSION:  Breast cancer status post lumpectomy and radiation  remaining left ovary.   DISPOSITION:  As noted above.  Of note is that a Pap smear was normal in  July 2007.           ______________________________  Almedia Balls. Randell Patient, M.D.     SRF/MEDQ  D:  07/16/2006  T:  07/16/2006  Job:  [id number]   cc:   Valentino Hue. Magrinat, M.D.  Fax: [phone]   Gordy Savers, MD  [address] [place]  [place] [address]

## 2010-08-02 NOTE — Op Note (Signed)
NAME:  Lauren, Fuller                ACCOUNT NO.:  [id number]   MEDICAL RECORD NO.:  [id number]          PATIENT TYPE:  AMB   LOCATION:  SDS                          FACILITY:  [facility]   PHYSICIAN:  Currie Paris, M.D.DATE OF BIRTH:  16-Dec-1955   DATE OF PROCEDURE:  04/09/2006  DATE OF DISCHARGE:                               OPERATIVE REPORT   CCS [id number]   PREOPERATIVE DIAGNOSIS:  Carcinoma of right breast status post  lumpectomy and radiation therapy.   POSTOPERATIVE DIAGNOSIS:  Carcinoma of right breast status post  lumpectomy and radiation therapy.   OPERATION:  Right total mastectomy.   SURGEON:  Currie Paris, M.D.   ASSISTANT:  Ollen Gross. Vernell Morgans, M.D.   ANESTHESIA:  General endotracheal.   CLINICAL HISTORY:  This is a 55 year old lady who has had a lumpectomy  plus a re-excision for upper outer quadrant right breast cancer.  She  still had inadequate margins and we elected to proceed with her chemo  and then come back with a mastectomy.  She is planning reconstruction to  be this coupled with a left mastectomy later on and Dr. Odis Luster was  planning to do a delayed TRAM at the conclusion of my mastectomy  today.   DESCRIPTION OF PROCEDURE:  The patient was seen in the holding area.  She had no further questions.  We identified and marked the right breast  as the operative side and confirmed the mastectomy was the planned  procedure.   The patient was taken to the operating room and after satisfactory  general anesthesia had been obtained, the entire breast, chest and  abdomen were prepped and draped as a sterile field.  The time-out  occurred.   I made an elliptical incision to include the old scar in the upper outer  quadrant.  The subcutaneous flaps were raised in the usual fashion  medially to the sternum, superiorly to the clavicle, inferiorly to the  inframammary folds and laterally to latissimus.  The breast was then  removed from the underlying  pectoralis muscle starting medially working  laterally.  We did see one small node that was taken in the axillary  tail area but no formal entry into the axilla was made.   I irrigated and made sure everything was dry.  Once it was completely  dry, I place two 19 Blake drains and secured them with 3-0 nylons.  The  wound was irrigated a final time and another check for hemostasis made  and again everything was dry.  The wound was closed with staples.  I  left about 30 mL of 0.25% plain Marcaine under the flaps for  approximately 10 minutes before charging the drains.   The patient tolerated procedure well.  There no operative complications.  All counts were correct.  At this point Dr. Odis Luster proceeded to do the  TRAM flap which will be separately dictated by him.      Currie Paris, M.D.  Electronically Signed     CJS/MEDQ  D:  04/09/2006  T:  04/09/2006  Job:  [id number]  cc:   Gordy Savers, MD  Valentino Hue. Magrinat, M.D.  Etter Sjogren, M.D.

## 2010-08-02 NOTE — H&P (Signed)
[facility]  Patient:    Lauren Fuller, Lauren Fuller Visit Number: [id number] MRN: [id number]          Service Type: MED Location: 1E 0106 01 Attending Physician:  Sandi Raveling Dictated by:   Charlcie Cradle Delford Field, M.D. [facility] Admit Date:  02/13/2001   CC:         Gordy Savers, M.D. [facility]  Viviann Spare R. Randell Patient, M.D.   History and Physical  CHIEF COMPLAINT:  Acute pulmonary embolism.  HISTORY OF PRESENT ILLNESS:  This is a 55 year old white female who is one week out from hysterectomy who noted an acute onset of right-sided pleuritic chest pain today. She was brought into the emergency room in acute distress. Spiral CT was positive for acute pulmonary embolism in the right lower lobe and also IVC clot and right femoral clot. No previous history of pulmonary embolism or deep venous thrombosis.  PAST SURGICAL HISTORY:  Cholecystectomy, tubal ligation, previous C-sections and knee surgery.  SOCIAL HISTORY:  The patient is married. She does not smoke.  MEDICATIONS PRIOR TO ADMISSION: 1. Nexium 40 mg b.i.d. 2. Allegra 60 mg b.i.d. 3. Guaifenesin.  ALLERGIES:  TALWIN, CODEINE, FIORINAL, MORPHINE, DILAUDID, and EXCEDRIN.  PAST MEDICAL HISTORY:  No history of hypertension, diabetes, MI, asthmatic bronchitis or COPD.  PHYSICAL EXAMINATION:  VITAL SIGNS:  Temperature 98, blood pressure 126/70, respirations 22, pulse 104.  CHEST:  Rub in the right lower lobe, decreased breath sounds right lower lobe.  CARDIAC:  Tachycardia without S3. Normal S1, S2.  ABDOMEN:  Soft, nontender. Bowel sounds active. Hysterectomy wound is clean and dry.  EXTREMITIES:  No edema or clubbing.  NEUROLOGIC:  Intact.  HEENT/NECK:  No jugular venous distention, lymphadenopathy. Oropharynx clear. Neck supple.  LABORATORY DATA:  Urinalysis negative. White count 16.1, hemoglobin 11.9, platelet count 392,000. Sodium 129, potassium ___, chloride 107, CO2 25, BUN 22, creatinine 0.8,  calcium 9.0, blood sugar 128, liver function tests were satisfactory.  CT of the chest showed right lower lobe clot with right lower lobe infarct.  IMPRESSION: 1. Acute pulmonary embolus, status post hysterectomy one week out. 2. Deep venous thrombosis.  RECOMMENDATIONS: 1. Will give IV heparin. 2. Will give pain control. 3. Administer oxygen supplementation therapy. 4. Admit to a telemetry bed. Dictated by:   Charlcie Cradle Delford Field, M.D. [facility] Attending Physician:  Sandi Raveling DD:  02/13/01 TD:  02/13/01 Job: [id number]

## 2010-08-02 NOTE — H&P (Signed)
[facility]  Patient:    Lauren Fuller, Lauren Fuller Visit Number: [id number] MRN: [id number]          Service Type: Attending:  Almedia Balls. Randell Patient, M.D. Dictated by:   Almedia Balls Randell Patient, M.D. Adm. Date:  02/04/01                           History and Physical  CHIEF COMPLAINT:  Heavy bleeding, fibroids.  HISTORY OF PRESENT ILLNESS:  The patient is a 56 year old gravida 2, para 2, whose last menstrual period was January 17, 2001.  She has been followed in our office since mid-August 2002 on referral from Dr. Laureen Ochs because of abnormal uterine size and abnormal bleeding, which was found to result in a hematocrit around 30.  She had ultrasound done in July 2002 showing a 4.5-5 cm diameter uterine fundal fibroid, submucosal, and a smaller fibroid in the posterior fundal area.  D&C done in mid-July showed all benign changes.  Pap smear was normal in August 2002 as well.  She has been tried on various progestational and hormonal suppression agents as well as methergine for improvement in bleeding without success.  She was seen in our office in mid-August with hematocrit of 38.  Examination reveals the uterus at 12-[redacted] weeks gestational size and somewhat tender.  Because of the problems that the patient has had, she is admitted at this time for abdominal hysterectomy, bilateral salpingo-oophorectomy on the patients preference.  PAST MEDICAL HISTORY:  C-section in 1988 for failure to progress, tubal ligation in 1993 for sterilization attempt, cholecystectomy in 1996.  Left knee surgery in 1995.  MEDICATIONS:  She has taken Allegra, Mircette, methergine as needed for excessive bleeding, and has been maintained on an iron supplement.  ALLERGIES:  TALWIN, FIORINAL NO. 3, and CODEINE.  FAMILY HISTORY:  Parents and various other members of the family with hypertension.  Mother has had one bypass and one valve replacement surgery in April 2002.  Parents also have  diabetes.  REVIEW OF SYSTEMS:  HEENT:  Wears glasses.  CARDIORESPIRATORY:  Negative. GASTROINTESTINAL:  As noted above only with gallbladder problems. GENITOURINARY:  As noted above.  NEUROLOGIC:  Negative.  PHYSICAL EXAMINATION:  VITAL SIGNS:  Height 5 feet 4 inches, weight 159 pounds, blood pressure 122/76, pulse 72, respirations 18.  GENERAL:  A well-developed white female in no acute distress.  HEENT:  Within normal limits.  NECK:  Supple without masses, adenopathy, or bruits.  CHEST:  Lungs clear to P&A.  BREASTS:  Breast exam sitting and lying without mass.  CARDIAC:  Regular rate and rhythm without murmurs.  ABDOMEN:  Flat and soft with mass effect in the lower abdomen to within two fingerbreadths of the umbilicus, irregular, and somewhat tender.  PELVIC:  External genitalia, Bartholins, urethra, and Skenes glands within normal limits.  Cervix slightly inflamed.  Uterus midposition, approximately 12-[redacted] weeks gestational size, irregular and tender.  There are no palpable adnexal masses.  Anterior and posterior cul-de-sac exam was confirmatory.  EXTREMITIES:  Within normal limits.  NEUROLOGIC:   Central nervous system grossly intact.  SKIN:  Without suspicious lesions.  IMPRESSION:  Uterine enlargement, probable leiomyomata, abnormal uterine bleeding, history of anemia secondary to above.  DISPOSITION:  As noted above. Dictated by:   Almedia Balls Randell Patient, M.D. Attending:  Almedia Balls. Randell Patient, M.D. DD:  02/02/01 TD:  02/02/01 Job: [id number]

## 2010-08-02 NOTE — Discharge Summary (Signed)
[facility]  Patient:    Lauren Fuller, Lauren Fuller Visit Number: [id number] MRN: [id number]          Service Type: SUR Location: 4W 0460 01 Attending Physician:  Collene Schlichter Dictated by:   Almedia Balls. Randell Patient, M.D. Admit Date:  02/04/2001 Discharge Date: 02/06/2001                             Discharge Summary  HISTORY OF PRESENT ILLNESS:  Patient is a 55 year old with abnormal uterine bleeding, pelvic pain, uterine enlargement for hysterectomy on February 04, 2001.  The remainder of her history and physical are as previously dictated. Laboratory data included preoperative hemoglobin 13.  Serum pregnancy test was negative.  HOSPITAL COURSE:  The patient was taken to the operating room at which time an abdominal supracervical hysterectomy and right salpingo-oophorectomy were performed.  The patient did well postoperatively.  Diet and ambulation were progressed over several days postoperatively.  On the morning of February 06, 2001, she was afebrile and experiencing no problems except for pain which was controlled by oral analgesics.  It was felt that she could be discharged at this time.  FINAL DIAGNOSIS:  Abnormal uterine bleeding, pelvic pain, uterine enlargement, recurring right ovarian cyst.  OPERATION:  Abdominal supracervical hysterectomy and right salpingo-oophorectomy.  Pathology report unavailable at the time of dictation.  DISPOSITION:  Discharged home to return to the office in two weeks for follow-up.  She was instructed to gradually progress her activities over several weeks at home and limit lifting and driving for two weeks.  She was fully ambulatory, on a regular diet, and in good condition at the time of discharge. Dictated by:   Almedia Balls Randell Patient, M.D. Attending Physician:  Collene Schlichter DD:  02/06/01 TD:  02/07/01 Job: [id number]

## 2010-08-02 NOTE — Discharge Summary (Signed)
[facility]  Patient:    Lauren Fuller, Lauren Fuller Visit Number: [id number] MRN: [id number]          Service Type: MED Location: 3W 0379 01 Attending Physician:  Caleb Popp Dictated by:   Charlaine Dalton. Sherene Sires, M.D. [facility] Admit Date:  02/13/2001 Discharge Date: 02/22/2001   CC:         Margit Banda. Jearld Fenton, M.D.  Harl Bowie, M.D.  Almedia Balls. Randell Patient, M.D.  Gordy Savers, M.D. [facility]   Discharge Summary  FINAL DIAGNOSES: 1. Acute pulmonary embolism with pulmonary infarction pattern, chest pain    associated with right pleural effusion. 2. Status post hysterectomy one week prior to admission. 3. Vocal cord dysfunction syndrome confirmed at [facility] and referred to [facility]    by Dr. Jearld Fenton. 4. History of multiple drug intolerance.  HISTORY:  This is a 55 year old white female admitted by Dr. Delford Field on November 30 with new onset right pleuritic pain with a CT scan confirming evidence of a right lower lobe embolus associated with right femoral vein and IVC thrombosis.  Please see H&P for details.  The patient initially had considerable difficulty with right-sided chest pain, even on IV heparin for several days, and developed a small right pleural effusion, which was not felt to be large enough to tap.  Ultimately, she was pain free, until the day prior to admission, when she developed the identical pain; that is, the identical location and character that then subsequently resolved on Vioxx.  I suspected that this was a paraembolic effusion that was large enough to initially buffer the pleural surfaces and then, as the effusion improved, she again developed "pleurisy" due to the surfaces coming again in contact, which was controlled easily with Vioxx.  Also during her hospitalization, she was noted to have an abnormal affect with "panic" type pattern breathing associated with marked hoarseness, difficulty swallowing, and loss of voice.  She has already been  evaluated by Dr. Jearld Fenton for this and confirmed to have vocal cord dysfunction syndrome at [facility] and referred to [facility] but has missed the appointment, which will need to be rescheduled.  She appeared to improve on a combination of Paxil, Xanax, and continued therapy for suspected GERD.  Finally, she also developed anemia on heparin.  Stool guaiacs were negative and hematocrit had risen to 28.6% after a nadir of 25% earlier in the hospitalization with multiple stool guaiacs negative.  At the time of discharge, she has completed nine days of heparin with three days of INR overlap.  She is ambulating with no significant dyspnea or chest pain, saturating well on room air.  She will therefore be discharged for followup in the Coumadin clinic in three days.  DISCHARGE MEDICATIONS: 1. Coumadin 5 mg daily. 2. Nexium 40 mg b.i.d. taken before meals. 3. Paxil XR 25 mg daily. 4. For nerves, she can use Xanax 0.5 one-half to one every six hours. 5. For pain, she can use Vioxx 25 mg tablets 1-2 daily with meals. 6. For severe pain, she has Mepergan to take 1-2 q.4h. p.r.n.  DISCHARGE INSTRUCTIONS:  She is to see Dr. Amador Cunas in one week and be seen in the Coumadin clinic on December 12 to have her pro time monitored and Coumadin adjusted.  She is to maintain a regular diet and keep her previously-scheduled followup with Dr. Randell Patient for postoperative purposes. Dictated by:   Charlaine Dalton. Sherene Sires, M.D. [facility] Attending Physician:  Caleb Popp DD:  02/22/01 TD:  02/22/01 Job:  [id number]

## 2010-08-02 NOTE — Op Note (Signed)
NAME:  Lauren Fuller, Lauren Fuller                ACCOUNT NO.:  [id number]   MEDICAL RECORD NO.:  [id number]          PATIENT TYPE:  AMB   LOCATION:  DSC                          FACILITY:  [facility]   PHYSICIAN:  Currie Paris, M.D.DATE OF BIRTH:  02-13-1956   DATE OF PROCEDURE:  10/31/2005  DATE OF DISCHARGE:                                 OPERATIVE REPORT   CCS#:  [id number].   PREOPERATIVE DIAGNOSIS:  Right breast cancer upper outer quadrant.   POSTOPERATIVE DIAGNOSIS:  Right breast cancer upper outer quadrant.   OPERATION:  Needle guided excision right breast cancer with blue dye  injection and right axillary sentinel lymph node biopsy (two nodes).   SURGEON:  Dr. Jamey Ripa.   ANESTHESIA:  General.   CLINICAL HISTORY:  Ms. Pareja is a 55 year old lady recently found to have  an invasive cancer in the right breast which measured by mammogram about 2-  2.4 cm.  The area of the abnormality on MRI looked bigger at about 5 cm.  After a lengthy discussion with her, we elected to proceed with needle  guided wide local excision and sentinel node evaluation.  She understood  that because of the area of abnormality on MRI, we would need to take a  large area and she might have a little bit more deformity and we might also  have problems getting a negative margin which would require additional  surgery.  All questions have been answered.  She has a history of DVT with  pulmonary embolus and we plan to use some preoperative heparin prophylaxis  as well as PAS hose.   DESCRIPTION OF PROCEDURE:  The patient was seen in the holding area.  The  guidewire had already been placed and I reviewed those films.  In addition,  she had already her radioisotope injected.  She had no further questions.  The right breast was marked as the operative side by the patient and myself.   The patient was taken to the operating room and after satisfactory general  anesthesia had been obtained, I prepped the right  nipple-areolar area as the  surgical site.  The time-out occurred.  I injected 5 mL of dilute methylene  blue (3 mL injectable saline mixed with 2 mL of methylene blue). This was  massaged in.   The entire breast and axillary area were then prepped and draped as a  sterile field.  I made a long incision starting at the guidewire entry site  which was lateral, took an ellipse of skin to include the prior biopsy site.  I then raised the skin flap superiorly until I thought I was well above any  tumor and went down to the chest wall dividing what all appeared to be fatty  tissue.  I then came around medially going down to the chest wall and I  thought the medial part of my incision was well past the guidewire and we  did not encounter the guidewire.  I then raised an inferior flap going down  to about the level of the areolar edge and then again divided the breast  tissue down to chest wall and here we had very dense fibrotic breast tissue  but nothing that looked malignant.  This was carried out laterally and then  the final lateral attachments which were really at the level of the  guidewire were divided.  This was all done with cautery.   I spent several minutes making sure everything was dry.  I then injected  about 20 mL of 0.25% plain Marcaine to help with postop analgesia.  The area  was packed with a moist 4 x 18 sponge.   Attention was turned to the right axilla.  Using a Neoprobe, a hot area was  identified.  A transverse incision was made and deepened until I entered  axillary fat.  I initially saw a pink lymph node and with distal dissection  around it saw blue lymphatic entering it.  It had counts of about 250-300  and I grasped it and excised it with cautery.  Using the Neoprobe, I found  another hot area which was much hotter a little bit further in and I made  further dissection and found a bright blue lymphatic leading into a bright  blue lymph node.  This was likewise  excised and had counts of about 8000.  Once this was excised, I could not find any other hot areas with the  Neoprobe, everything was background to 0 to about 25.  I felt no palpable  adenopathy and saw no other blue nodes or blue lymphatics that I could trace  out.   Again I irrigated and made sure everything was dry and put some Marcaine in.   While we were waiting for both the specimen mammogram and touch preps, I  went ahead and turned my attention back to the breast.  This incision was  closed with some 3-0 Vicryl to close the deeper tissues over the muscle to  cover the muscle and then some 3-0 Vicryl subcu with 4-0 Monocryl  subcuticular.  The breast site had remained completely dry while we were  working on the axilla.   Attention was turned back to the axilla and again this had remained dry  while I was closing the breast.  I then closed it with 3-0 Vicryl and 4-0  Monocryl subcuticular.   Radiology reported that the specimen mammogram showed the clip and the area  to be well within the specimen.  Pathology reported that the touch preps on  the sentinel nodes were negative as were they on the lumpectomy specimen and  grossly the closest margin was the superior at 1 cm.   This concluded the procedure.  The patient tolerated the procedure well.  There were no operative complications.  All counts were correct.      Currie Paris, M.D.  Electronically Signed     CJS/MEDQ  D:  10/31/2005  T:  10/31/2005  Job:  [id number]   cc:   Gordy Savers, MD  Almedia Balls. Randell Patient, M.D.

## 2010-08-02 NOTE — Discharge Summary (Signed)
NAME:  Lauren Fuller, Lauren Fuller                ACCOUNT NO.:  [id number]   MEDICAL RECORD NO.:  [id number]          PATIENT TYPE:  INP   LOCATION:  1309                         FACILITY:  [facility]   PHYSICIAN:  Valentino Hue. Magrinat, M.D.DATE OF BIRTH:  [birth year]   DATE OF ADMISSION:  02/05/2006  DATE OF DISCHARGE:  02/09/2006                               DISCHARGE SUMMARY   DISCHARGE DIAGNOSES:  1. Uncontrolled diarrhea, present before admission.  2. Dehydration, present before admission.  3. Contact dermatitis, present before admission.  4. Anemia, present before admission.  5. Hypokalemia.  6. Breast cancer.  7. Psoriasis.  8. History of deep vein thrombosis and pulmonary embolus, remote.  9. Hyperlipidemia.  10.Gastroesophageal reflux disease, status post Nissen fundoplication.  11.Status post hysterectomy.  12.Status post cholecystectomy.  13.Status post salpingo-oophorectomy.  14.Status post cesarean section.   PROCEDURES:  Aggressive hydration, correction of hypokalemia secondary  to diarrhea.   HOSPITAL COURSE:  The patient was admitted with fever, diarrhea, nausea,  and inability to keep herself well-hydrated.  She was pancultured and  stool x3 was negative for C. difficile.  The diarrhea was treated  aggressively with Questran and motility agents and octreotide.  By the  time of discharge, the patient only had four very small liquid bowel  movements in the prior 24 hours.   The patient was aggressively hydrated, her hypokalemia was gradually  corrected with intravenous potassium runs.  At the time of discharge,  her potassium is 4.1 with a BUN of 3 and a creatinine of 0.7.   The patient's fever was evaluated with multiple cultures as stated, all  of which remained negative at the time of this dictation and she has  been afebrile for the past 48 hours prior to discharge.   The patient's diet has been kept on a restricted basis but was advanced  the day prior to discharge to full  liquids which the patient is  tolerating moderately well.   The patient has other problems, including hemorrhoids, chronic  migraines, and, of course, her psoriasis and more recently a contact  dermatitis.  These were addressed appropriately during this admission.  The contact dermatitis in particular is much improved after the patient  was evaluated by Dr. Mayford Knife and started on clobetasol cream.   At the time of discharge, the patient's temperature is 98.2, pulse 96,  respirations 20, blood pressure 114/63, and room air saturation is 100%.  Her white cell count is 44,000 (status post Neulasta), hemoglobin is  stable and slightly rising at 8.8 and platelet count is normal at  228,000.   CONDITION ON DISCHARGE:  Improved.   DISCHARGE MEDICATIONS:  She will continue on sertraline (Zoloft) 50 mg  two tablets at bedtime, Topamax 50 mg twice a day, amitriptyline 50 mg  at bedtime, prednisone 5 mg two tablets with breakfast daily, Lipitor 40  mg daily, Allegra as needed, omeprazole (Prilosec) daily, aspirin 81 mg  daily, and clobetasol cream to the hands three times a day.   DIET:  Unrestricted.   ACTIVITY:  Unrestricted.   She already has appointments to see  me and Dr. Jamey Ripa in follow-up.  I  am asking her to call later this week to let us know how she is doing as  far as the stools are concerned - once she is 24 hours with no bowel  movement she will start stool softeners to see if we can avoid worsening  her hemorrhoid problem.      Valentino Hue. Magrinat, M.D.  Electronically Signed     GCM/MEDQ  D:  02/09/2006  T:  02/09/2006  Job:  [id number]   cc:   Gordy Savers, MD  [address] [place]  [place] [address]   Currie Paris, M.D.  [address]  [place]  [place] [address]   Artist Pais. Kathrynn Running, M.D.  Fax: [phone]   Dollene Cleveland, M.D.  Fax: [phone]

## 2010-08-02 NOTE — Op Note (Signed)
NAME:  Lauren Fuller, Lauren Fuller                ACCOUNT NO.:  [id number]   MEDICAL RECORD NO.:  [id number]          Fuller TYPE:  AMB   LOCATION:  DSC                          FACILITY:  [facility]   PHYSICIAN:  Currie Paris, M.D.DATE OF BIRTH:  10/16/1955   DATE OF PROCEDURE:  11/27/2005  DATE OF DISCHARGE:                                 OPERATIVE REPORT   PREOPERATIVE DIAGNOSIS:  Carcinoma, right breast upper outer quadrant, with  positive inferior margin.   POSTOPERATIVE DIAGNOSIS:  Carcinoma, right breast upper outer quadrant, with  positive inferior margin.   OPERATION:  Re-excision of inferior margin of right lumpectomy site;  placement of Port-A-Cath.   SURGEON:  Dr. Jamey Ripa.   ANESTHESIA:  General.   CLINICAL HISTORY:  [name] is a 50-year lady who underwent excision of a  fairly large right breast cancer.  She had invasive carcinoma present at the  inferior margin.  The rest the margins appeared to be completely negative.  After consultation with Dr. Darnelle Catalan and review of her situation, she  elected to proceed to re-excision of her cancer with placement of Port-A-  Cath for chemotherapy.   DESCRIPTION OF PROCEDURE:  The Fuller was seen in the holding area, and she  had no further questions.  The right breast was marked as the operative side  for the re-excision.   The Fuller was then taken to the operating room, and after satisfactory  general anesthesia had been obtained, the right breast was prepped and  draped.  The time-out occurred.   I reopened the old scar.  There is a well-formed seroma cavity.  I excised  another centimeter of the entire inferior margin.  In one area where I  thought it had gotten a little bit thin, I did take a little bit of extra  tissue.   I spent several minutes making sure everything was completely dry.  Once  that was the case, we went ahead and closed using 3-0 Vicryl, 4-0 Monocryl  subcuticular and some Steri-Strips.   The Fuller  was then repositioned for the Port-A-Cath placement.  The left  upper chest and lower neck were prepped and draped as a single sterile  field.   The left subclavian vein was entered on the initial attempt and the  guidewire threaded easily and using fluoro positioned in the superior vena  cava.   Transverse incision was made over the anterior chest wall and a pocket  formed for the reservoir.  The Port-A-Cath tubing was pulled through this  subcutaneous tunnel and flushed with dilute heparin saline.   The Port-A-Cath wire tract was then dilated once the dilator, and the  dilator and guidewire were removed and the catheter advanced to 23 cm.  The  peel-away sheath was removed.  The catheter was backed up using fluoro to  approximately 18 cm, and it appeared to be at the junction of superior vena  cava and right atrium.  It aspirated and irrigated easily.   The reservoir was flushed and attached and the locking mechanism engaged.  It aspirated and flushed easily.  Using the suture holes, the reservoir was attached to the fascia.  The final  fluoro showed good positioning with no kinks.   The wound was then closed with 3-0 Vicryl followed by 4-0 Monocryl  subcuticular and Dermabond.  I did a final aspiration and irrigation with  first dilute heparin and then concentrated aqueous heparin.   The Fuller tolerated the procedure well.  There were no operative  complications.  All counts were correct.      Currie Paris, M.D.  Electronically Signed     CJS/MEDQ  D:  11/27/2005  T:  11/28/2005  Job:  [id number]   cc:   Lauren Savers, MD  Lauren Fuller. Lauren Fuller, M.D.  Lauren Fuller. Magrinat, M.D.

## 2010-08-02 NOTE — Op Note (Signed)
NAME:  Lauren Fuller, Lauren Fuller                ACCOUNT NO.:  [id number]   MEDICAL RECORD NO.:  [id number]          PATIENT TYPE:  AMB   LOCATION:  SDS                          FACILITY:  [facility]   PHYSICIAN:  Etter Sjogren, M.D.     DATE OF BIRTH:  Feb 01, 1956   DATE OF PROCEDURE:  04/09/2006  DATE OF DISCHARGE:                               OPERATIVE REPORT   PREOPERATIVE DIAGNOSIS:  Breast cancer.   POSTOPERATIVE DIAGNOSIS:  Breast cancer with complex wound of the  abdomen greater than 30 cm.   PROCEDURE PERFORMED:  Delay of a TRAM flap and complex wound closure of  abdomen greater than 30 cm.   SURGEON:  Etter Sjogren, M.D.   ANESTHESIA:  General.   ESTIMATED BLOOD LOSS:  Minimal.   CLINICAL NOTE:  A 55 year old woman with breast cancer who desires  breast reconstruction.  She is having a mastectomy today and will be  having radiation to the right chest.  It is felt that the reconstruction  should be delayed because of the radiation, that a delay of procedure  would help.  This was explained to her in detail.  She also understood  that if she did have mastectomy on the left side, it would need to be a  different type of reconstruction, most likely, which would very likely  involve the use of tissue expander and implant on that side.  She  understood all this and wished to proceed.   DESCRIPTION OF PROCEDURE:  The patient was in the operating room.  The  general surgery mastectomy had been completed and there were no  complications.  Incision made, dissection carried down through  subcutaneous tissue and Scarpa fascia using electrocautery.  Underlying  rectus fascia identified and incision made just lateral to the rectus  muscles bilaterally.  Deep inferior epigastric vessels were identified,  mobilized, and triple or quadruple ligated proximal and distal artery  and veins and divided.  Excellent hemostasis was confirmed.  Thorough  irrigation with saline.  Closure of the fascia with 0  Prolene  interrupted figure-of-eight sutures.  Marcaine 0.25% plain was placed  around these fascial incisions.  The wound irrigated again with saline  and the closure 0 PDS interrupted inverted deep sutures for the Scarpa  layer, 3-0 Monocryl interrupted inverted deep dermal sutures, and 3-0  Monocryl running subcuticular suture.  Steri-Strips and dry sterile  dressing applied, tolerated well.      Etter Sjogren, M.D.  Electronically Signed     DB/MEDQ  D:  04/09/2006  T:  04/09/2006  Job:  [id number]

## 2010-08-02 NOTE — H&P (Signed)
NAME:  Lauren Fuller, Lauren Fuller                ACCOUNT NO.:  [id number]   MEDICAL RECORD NO.:  [id number]          PATIENT TYPE:  EMS   LOCATION:  ED                           FACILITY:  [facility]   PHYSICIAN:  Firas N. Shadad        DATE OF BIRTH:  02/05/1956   DATE OF ADMISSION:  02/05/2006  DATE OF DISCHARGE:                              HISTORY & PHYSICAL   REASON FOR ADMISSION:  Fever and diarrhea status post chemotherapy.   HISTORY OF PRESENT ILLNESS:  A very pleasant 55 year old female who is a  native of [place] and been in [place] for quite awhile.  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  The patient underwent a lumpectomy on  August 2007 with positive margins.  She also had a repeat surgery in  September of 2007, however, she continued to have positive margins.  The  patient given her persistent positive margins the patient was discussed  in the multidisciplinary clinic and then felt that a systemic  chemotherapy and then bilateral mastectomy would be the way to go.  Of  note she had 0/2 lymph node positive in her sentinel lymph node  sampling.  The patient received chemotherapy with Adriamycin and Cytoxan  for 3 cycles that is poorly tolerated.  Chemotherapy was switched to  Taxotere and Cytoxan done on January 29, 2006 and since that time she  has continued to do poorly.  She had nausea and vomiting, dehydration  and required IV fluids in the clinic.  She had persistent diarrhea and  today called in.  She had initially low grade fever but subsequently  developed a temperature of 102 and based on these findings the patient  was asked to come to be evaluated in the emergency department.  Upon  evaluation of  Lauren Fuller she reports that she is doing extremely poorly  and she is unable to take p.o. adequately, feeling lousy.  Did have  report of some chills, some abdominal pain and some diarrhea.  No other  localizing symptoms.  Did not report any erythema around the Port-A-  Cath.  Did not have any pulmonary complaints.  Overall felt rather  poorly.   REVIEW OF SYSTEMS:  No report of any headaches, blurry vision, double  vision.  Did not report any alteration of mental status, psychiatric  issues or depression.  She did not report any chills or night sweats,  but did report fevers and weakness and decrease in her p.o. intake.  She  did not report any chest pain, orthopnea, PND.  Did not report any  palpitation.  Did not report any shortness of breath or difficulty  breathing.  Does report some nausea, but no vomiting.  Did report  diarrhea but did not report any early satiety.  Does report some  abdominal discomfort.  She did not report any heat or cold intolerance.  Did not report any bleeding diathesis.  Did not report any arthralgias  or myalgias.  She did report some skin rashes on her tip of her fingers.  The rest of review of systems is  unremarkable.   PAST MEDICAL HISTORY:  1. Significant for psoriasis.  2. History of DVT with pulmonary embolus in 2002.  3. She has a history of hyperlipidemia.  4. GERD.  5. She is also status post Nissen fundoplication.  6. Status post hysterectomy.  7. Salpingo-oophorectomy.  8. Cholecystectomy.  9. A tubal ligation.  10.A C-section.   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  She  has a son and a daughter.  Did not report any alcohol or drinking  history.   FAMILY HISTORY:  Negative for any breast or ovarian cancer.   ALLERGIES:  ALLERGIC TO CODEINE, DILAUDID AS WELL AS BIL AXONE AND  FLUDROCORTISONE.   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  Is  also on Topamax, Ultram and Zegerid.   PHYSICAL EXAMINATION:  An alert, awake female.  Did not appear toxic,  however, she felt rather badly.  Her temperature today is 101.70, blood  pressure is 100/50, respirations 18.  HEENT:  Head is normocephalic, atraumatic.  Pupils equal, round,  reactive to light.  Mucous membranes are moist and pink.  NECK:  Supple.  No lymphadenopathy.  HEART:  Regular rate and rhythm.  S1 and S2.  LUNGS:  Chest to auscultation without rhonchi, wheeze, dullness to  percussion.  ABDOMEN:  Soft, nontender.  No hepatosplenomegaly.  EXTREMITIES:  No clubbing, cyanosis or edema.  NEUROLOGIC:  Intact motor, sensory and deep tendon reflex.  SKIN:  Showed some slight erythema.   Blood count showed a hemoglobin of 10, white cells 3.2, platelet count  of 235.  The rest of her chemistries are currently pending.   ASSESSMENT:  A 55 year old female with the following issues:  1. Breast cancer status post adjuvant chemotherapy.  2. Fever.  3. Poor p.o. intake.  4. Diarrhea.  5. Psoriasis of the skin of her neck.  6. A history of migraine headaches.   PLAN:  Will be admit Ms. Creason to [facility].  Given her poor  p.o. intake and fever we will obtain culture of blood, urine and stool  for C-diff,  administer IV fluids.  We will start empiric antibiotic  with Cefepime  until cultures are negative.  If her symptoms improve in the next 24  hours she could be discharged, but if she does have indeed any infection  we will preemptively have treated it.  I will continue her prednisone  taper for her psoriasis and as mentioned we will obtain pan culture.  We  will also obtain a chest x-ray today.           ______________________________  Blenda Nicely. [facility]  Electronically Signed     FNS/MEDQ  D:  02/05/2006  T:  02/05/2006  Job:  [id number]   cc:   Valentino Hue. Magrinat, M.D.  Fax: [phone]   Artist Pais. Kathrynn Running, M.D.  Fax: [phone]  Currie Paris, M.D.   [address], [address]  [place]  [place] [address]   Gordy Savers, MD  [address] [place]  [place] [address]

## 2010-08-02 NOTE — Op Note (Signed)
[facility]  Patient:    Lauren Fuller, Lauren Fuller Visit Number: [id number] MRN: [id number]          Service Type: SUR Location: 4W 0460 01 Attending Physician:  Collene Schlichter Dictated by:   Almedia Balls. Randell Patient, M.D. Proc. Date: 02/04/01 Admit Date:  02/04/2001   CC:         Harl Bowie, M.D.   Operative Report  PREOPERATIVE DIAGNOSIS:  Abnormal uterine bleeding, pelvic pain, and uterine enlargement.  POSTOPERATIVE DIAGNOSIS:  Abnormal uterine bleeding, pelvic pain, and uterine enlargement, pending pathology.  OPERATION:  Abdominal supracervical hysterectomy, right salpingo-oophorectomy.  ANESTHESIA:  General orotracheal.  SURGEON:  Almedia Balls. Randell Patient, M.D.  FIRST ASSISTANT:  Harl Bowie, M.D.  INDICATIONS FOR PROCEDURE:  The patient is a 55 year old with the above noted problems who was counseled as to the nature of the procedure and the risks involved for the necessary procedure.  These risks including risks of anesthesia, injury to the bowel, bladder, blood vessels, ureters, postoperative hemorrhage, infection, recuperation, and possible hormone replacement should her ovaries be removed.  She fully understands all these considerations and wishes to proceed on February 04, 2001.  OPERATIVE FINDINGS:  On entry into the peritoneal cavity, there was an adhesion involving the sigmoid in the left lower quadrant.  There were adhesions involving the area of the lower liver edge where previous cholecystectomy had been performed.  Palpation of the spleen, kidneys, and periaortic areas, as well as visualization of the appendix showed these organs to be all normal.  The uterus was mid posterior, approximately 8-[redacted] weeks gestational size and soft.  There were some adhesions involving the bladder flap overlying the lower uterine segment.  Both tubes had been previously surgically interrupted.  The right ovary had several small cysts, and there was a  history of numerous larger cysts on this ovary.  The left ovary appeared normal.  DESCRIPTION OF PROCEDURE:  With the patient under general anesthesia, prepared and draped in the usual sterile fashion, and a Foley catheter in the bladder, a low abdominal transverse incision was made after excising a previous surgical scar.  This incision was carried into the peritoneal cavity without difficulty.  A self-retaining retractor was placed and the bowel was packed off.  Kelly clamps were used to clamp the utero-ovarian anastomoses, tubes, and round ligaments bilaterally for traction and hemostasis.  The left round ligament was then transected using Bovie electrocoagulation with entry into the retroperitoneal space with development of the bladder flap anteriorly. Uterine vessels were then skeletonized, clamped, cut, and suture ligated with 1 chromic catgut.  Cardinal ligament was likewise clamped, cut, and suture ligated with 1 chromic catgut.  Because of the ________ of this ovary on the left, it was felt that this one should be conserved.  The right round ligament was similarly transected with suture ligature and entry into the retroperitoneal space.  The infundibulopelvic ligament on the right was then identified, clamped, cut, and doubly ligated with 1 chromic catgut for removal of the right tube and ovary.  Uterine vessels on this side were also skeletonized, clamped, cut, and suture ligated with 1 chromic catgut. Cardinal ligaments were likewise clamped, cut, and suture ligated with 1 chromic catgut.  It was then possible to excise the fundus using Bovie electrocoagulation.  The cervix was rendered hemostatic with Bovie electrocoagulation, and the endocervix was treated so that leukorrhea would be prevented.  Interrupted figure-of-eight sutures of #1 chromic catgut were placed across the cervix  for hemostasis and then reapproximation of the edges. The area was then lavaged with copious  amounts of lactated Ringers solution, and as noted hemostasis was maintained, the left tube and ovary were suspended from the left round ligament by an interrupted suture of 1 chromic catgut. The areas were ___________ with interrupted suture #1 chromic catgut.  Again, after noting that hemostasis was maintained, and the sponge and instrument counts were correct, the peritoneum was closed with a continuous suture of 0 Vicryl.  The fascia was closed with two sutures of 0 Vicryl which were brought from the lateral aspect of the incision and tied separately in the midline.  The subcutaneous fat was reapproximated with interrupted sutures of 0 Vicryl.  The skin was closed with a subcuticular suture of 3-0 plain catgut.  Estimated blood loss 100 ml.  The patient was taken to the recovery room in good condition with clear urine on the Foley catheter tubing.  She will be placed on 23-hour observation following surgery. Dictated by:   Almedia Balls Randell Patient, M.D. Attending Physician:  Collene Schlichter DD:  02/04/01 TD:  02/05/01 Job: [id number]

## 2010-08-02 NOTE — Discharge Summary (Signed)
NAME:  Lauren Fuller, BUDA NO.:  [id number]   MEDICAL RECORD NO.:  [id number]          PATIENT TYPE:  INP   LOCATION:  5714                         FACILITY:  [facility]   PHYSICIAN:  Etter Sjogren, M.D.     DATE OF BIRTH:  08-Oct-1955   DATE OF ADMISSION:  04/09/2006  DATE OF DISCHARGE:  04/11/2006                               DISCHARGE SUMMARY   FINAL DIAGNOSIS:  Breast cancer.   PROCEDURE PERFORMED:  1. Right total mastectomy.  2. A delay of a TRAM flap with complex wound closure of the abdomen      greater than 30 cm.   SUMMARY OF H&P:  This 55 year old woman has breast cancer.  She has had  adjuvant chemotherapy after a lumpectomy and sentinel lymph node.  She  will be having postoperative radiation.  She would like breast  reconstruction and options were discussed with her.  We would want to  defer any reconstruction until after the radiation is completed.  At the  same time, it would be beneficial to delay the TRAM flap at this point.  She understood this and wished to proceed with TRAM delay at the time of  her mastectomy.  For further details of the history and physical, please  see the chart.   [facility]:  On admission, she was taken to surgery for her total  mastectomy and delay of the TRAM flap of wound closure of the abdomen.  She tolerated that procedure well.  Postoperatively, she did well.  Afebrile, drain functioning.  She had some pain the following day and  felt that she was not ambulating as well as she should like and we  discontinued the IV and had her up to the hall with some assistance and  she ambulated extremely well and felt much better by the second  postoperative day.  She continued afebrile.  Drain functioning for the  mastectomy site.  Abdominal site looks good.  No evidence of any  hematoma.  She felt that she was ready to be discharged.   DISPOSITION:  She was discharged with [facility] #30, given 1 p.o.  q.4 hours p.r.n.  for pain.  She will be on home Lovenox 40 mg subcu  daily by the visiting nurse for 5 days because she did have a pulmonary  embolus 5 or 6 years ago at the time of a hysterectomy.  No shower yet.  No lifting.  She will follow up with Dr. Odis Luster in 3 weeks and with Dr.  Jamey Ripa.  She will call us for an appointment.      Etter Sjogren, M.D.  Electronically Signed     DB/MEDQ  D:  04/11/2006  T:  04/12/2006  Job:  [id number]

## 2010-08-06 ENCOUNTER — Encounter: Payer: Self-pay | Admitting: Internal Medicine

## 2010-08-06 ENCOUNTER — Ambulatory Visit (INDEPENDENT_AMBULATORY_CARE_PROVIDER_SITE_OTHER): Payer: BC Managed Care – PPO | Admitting: Internal Medicine

## 2010-08-06 DIAGNOSIS — J069 Acute upper respiratory infection, unspecified: Secondary | ICD-10-CM

## 2010-08-06 DIAGNOSIS — E785 Hyperlipidemia, unspecified: Secondary | ICD-10-CM

## 2010-08-06 NOTE — Progress Notes (Signed)
  Subjective:    Patient ID: Lauren Fuller, female    DOB: January 27, 1956, 55 y.o.   MRN: [id number]  HPI  55 year old patient who has a history of treated hypertension. She presents with a 2 to three-day history of sinus congestion and pressure. She has been using Zyrtec with minimal benefit she also has a history of rest cancer remote pulmonary embolism. She is on Lipitor for dyslipidemia. She is followed by a number of consultants. She denies any fever localized sinus pain or purulent drainage. She is accompanied by her son who has lower respiratory tract symptoms    Review of Systems  Constitutional: Negative.   HENT: Positive for congestion and rhinorrhea. Negative for hearing loss, sore throat, dental problem, sinus pressure and tinnitus.   Eyes: Negative for pain, discharge and visual disturbance.  Respiratory: Negative for cough and shortness of breath.   Cardiovascular: Negative for chest pain, palpitations and leg swelling.  Gastrointestinal: Negative for nausea, vomiting, abdominal pain, diarrhea, constipation, blood in stool and abdominal distention.  Genitourinary: Negative for dysuria, urgency, frequency, hematuria, flank pain, vaginal bleeding, vaginal discharge, difficulty urinating, vaginal pain and pelvic pain.  Musculoskeletal: Negative for joint swelling, arthralgias and gait problem.  Skin: Negative for rash.  Neurological: Negative for dizziness, syncope, speech difficulty, weakness, numbness and headaches.  Hematological: Negative for adenopathy.  Psychiatric/Behavioral: Negative for behavioral problems, dysphoric mood and agitation. The patient is not nervous/anxious.        Objective:   Physical Exam  Constitutional: She is oriented to person, place, and time. She appears well-developed and well-nourished.  HENT:  Head: Normocephalic.  Right Ear: External ear normal.  Left Ear: External ear normal.  Mouth/Throat: Oropharynx is clear and moist.  Eyes: Conjunctivae  and EOM are normal. Pupils are equal, round, and reactive to light.  Neck: Normal range of motion. Neck supple. No thyromegaly present.  Cardiovascular: Normal rate, regular rhythm, normal heart sounds and intact distal pulses.   Pulmonary/Chest: Effort normal and breath sounds normal.  Abdominal: Soft. Bowel sounds are normal. She exhibits no mass. There is no tenderness.  Musculoskeletal: Normal range of motion.  Lymphadenopathy:    She has no cervical adenopathy.  Neurological: She is alert and oriented to person, place, and time.  Skin: Skin is warm and dry. No rash noted.  Psychiatric: She has a normal mood and affect. Her behavior is normal.          Assessment & Plan:    Viral URI. We'll treat symptomatically. Hypertension stable we'll continue present regimen

## 2010-08-06 NOTE — Patient Instructions (Signed)
Get plenty of rest, Drink lots of  clear liquids, and use Tylenol or ibuprofen for fever and discomfort.    Call or return to clinic prn if these symptoms worsen or fail to improve as anticipated.  

## 2010-09-04 ENCOUNTER — Other Ambulatory Visit: Payer: Self-pay | Admitting: Internal Medicine

## 2010-10-21 ENCOUNTER — Other Ambulatory Visit: Payer: Self-pay | Admitting: Oncology

## 2010-10-21 ENCOUNTER — Encounter (HOSPITAL_BASED_OUTPATIENT_CLINIC_OR_DEPARTMENT_OTHER): Payer: BC Managed Care – PPO | Admitting: Oncology

## 2010-10-21 DIAGNOSIS — C50419 Malignant neoplasm of upper-outer quadrant of unspecified female breast: Secondary | ICD-10-CM

## 2010-10-21 DIAGNOSIS — Z170 Estrogen receptor positive status [ER+]: Secondary | ICD-10-CM

## 2010-10-21 LAB — CBC WITH DIFFERENTIAL/PLATELET
BASO%: 0.2 % (ref 0.0–2.0)
Basophils Absolute: 0 10*3/uL (ref 0.0–0.1)
EOS%: 1 % (ref 0.0–7.0)
HGB: 12.8 g/dL (ref 11.6–15.9)
MCH: 32.1 pg (ref 25.1–34.0)
MCHC: 34.9 g/dL (ref 31.5–36.0)
MCV: 91.9 fL (ref 79.5–101.0)
MONO%: 5.9 % (ref 0.0–14.0)
RBC: 3.98 10*6/uL (ref 3.70–5.45)
RDW: 12.7 % (ref 11.2–14.5)

## 2010-10-22 LAB — COMPREHENSIVE METABOLIC PANEL
ALT: 17 U/L (ref 0–35)
AST: 18 U/L (ref 0–37)
Alkaline Phosphatase: 114 U/L (ref 39–117)
CO2: 25 mEq/L (ref 19–32)
Creatinine, Ser: 0.72 mg/dL (ref 0.50–1.10)
Sodium: 139 mEq/L (ref 135–145)
Total Bilirubin: 0.2 mg/dL — ABNORMAL LOW (ref 0.3–1.2)
Total Protein: 6.5 g/dL (ref 6.0–8.3)

## 2010-10-22 LAB — VITAMIN D 25 HYDROXY (VIT D DEFICIENCY, FRACTURES): Vit D, 25-Hydroxy: 32 ng/mL (ref 30–89)

## 2010-10-24 ENCOUNTER — Other Ambulatory Visit: Payer: Self-pay | Admitting: Oncology

## 2010-10-24 ENCOUNTER — Encounter (HOSPITAL_BASED_OUTPATIENT_CLINIC_OR_DEPARTMENT_OTHER): Payer: BC Managed Care – PPO | Admitting: Oncology

## 2010-10-24 DIAGNOSIS — C50419 Malignant neoplasm of upper-outer quadrant of unspecified female breast: Secondary | ICD-10-CM

## 2010-10-24 DIAGNOSIS — Z170 Estrogen receptor positive status [ER+]: Secondary | ICD-10-CM

## 2010-12-26 LAB — DIFFERENTIAL
Basophils Absolute: 0
Basophils Relative: 0
Eosinophils Absolute: 0.1
Eosinophils Relative: 1
Lymphocytes Relative: 26
Lymphs Abs: 1.9
Monocytes Absolute: 0.6
Monocytes Relative: 8
Neutro Abs: 4.5
Neutrophils Relative %: 64

## 2010-12-26 LAB — CBC
MCHC: 33.9
MCV: 90.3
Platelets: 312
WBC: 7.1

## 2010-12-26 LAB — BASIC METABOLIC PANEL
BUN: 9
CO2: 24
Chloride: 105
Creatinine, Ser: 0.88

## 2010-12-26 LAB — URINALYSIS, ROUTINE W REFLEX MICROSCOPIC
Bilirubin Urine: NEGATIVE
Glucose, UA: NEGATIVE
Hgb urine dipstick: NEGATIVE
Ketones, ur: NEGATIVE
Nitrite: NEGATIVE
Protein, ur: NEGATIVE
Specific Gravity, Urine: 1.014
Urobilinogen, UA: 0.2
pH: 7.5

## 2010-12-26 LAB — COMPREHENSIVE METABOLIC PANEL
ALT: 21
AST: 19
CO2: 25
Calcium: 9.3
GFR calc Af Amer: >60
GFR calc non Af Amer: >60
Potassium: 3.7
Sodium: 139
Total Protein: 7

## 2011-01-02 ENCOUNTER — Ambulatory Visit (INDEPENDENT_AMBULATORY_CARE_PROVIDER_SITE_OTHER): Payer: BC Managed Care – PPO | Admitting: Internal Medicine

## 2011-01-02 DIAGNOSIS — Z0000 Encounter for general adult medical examination without abnormal findings: Secondary | ICD-10-CM

## 2011-01-02 DIAGNOSIS — Z23 Encounter for immunization: Secondary | ICD-10-CM

## 2011-02-13 ENCOUNTER — Telehealth: Payer: Self-pay | Admitting: *Deleted

## 2011-02-13 NOTE — Telephone Encounter (Signed)
Lauren Fuller is calling because her insurance is requesting results from labs.  The whole family will need this.  Please call the patient with numbers if available.

## 2011-02-18 NOTE — Telephone Encounter (Signed)
Spoke with Lauren Fuller - her labs drawn in 07/2010   tommoy's drawn 12/2009 - ppwk for work needing cholestorol - check time frame , may need to have lab drawn .

## 2011-02-19 ENCOUNTER — Telehealth: Payer: Self-pay

## 2011-02-19 DIAGNOSIS — E785 Hyperlipidemia, unspecified: Secondary | ICD-10-CM

## 2011-02-19 NOTE — Telephone Encounter (Signed)
Pt called back and stated she and her husband both need to have lab work done early one morning.  Pt is aware of appt.

## 2011-02-20 ENCOUNTER — Other Ambulatory Visit (INDEPENDENT_AMBULATORY_CARE_PROVIDER_SITE_OTHER): Payer: BC Managed Care – PPO

## 2011-02-20 DIAGNOSIS — E785 Hyperlipidemia, unspecified: Secondary | ICD-10-CM

## 2011-02-20 LAB — LIPID PANEL
HDL: 57.3 mg/dL (ref >39.00)
Total CHOL/HDL Ratio: 3
VLDL: 32.2 mg/dL (ref 0.0–40.0)

## 2011-03-03 ENCOUNTER — Telehealth: Payer: Self-pay | Admitting: *Deleted

## 2011-03-03 NOTE — Telephone Encounter (Signed)
Daughter is not a patient here. You told pt to have her whole family to have a certain test since pt has a blood clot to check to see if they were at risk for getting clots.

## 2011-03-03 NOTE — Telephone Encounter (Signed)
Pt also needs to know what type of test that her daughter needs to have done since she had a blood clot. Her daughter is trying to have a child and her gyn needs to know why type of test they need to test her for.

## 2011-03-03 NOTE — Telephone Encounter (Signed)
Pt is wanting lab results 

## 2011-03-03 NOTE — Telephone Encounter (Signed)
I do not recall the details of this conversation. The patient has a remote history of a pulmonary embolism but is not on chronic Coumadin anticoagulation. Tell patient that according to current guidelines,  do not feel that testing the family members is clinically warranted. She may want to check with her hematologist Dr. Darnelle Catalan

## 2011-03-04 NOTE — Telephone Encounter (Signed)
Spoke with wife - discussed labs

## 2011-03-18 HISTORY — PX: CATARACT EXTRACTION: SUR2

## 2011-03-31 ENCOUNTER — Other Ambulatory Visit: Payer: Self-pay | Admitting: Internal Medicine

## 2011-04-15 ENCOUNTER — Encounter (INDEPENDENT_AMBULATORY_CARE_PROVIDER_SITE_OTHER): Payer: Self-pay | Admitting: Surgery

## 2011-04-18 ENCOUNTER — Encounter (INDEPENDENT_AMBULATORY_CARE_PROVIDER_SITE_OTHER): Payer: Self-pay | Admitting: Surgery

## 2011-04-18 ENCOUNTER — Ambulatory Visit (INDEPENDENT_AMBULATORY_CARE_PROVIDER_SITE_OTHER): Payer: BC Managed Care – PPO | Admitting: Surgery

## 2011-04-18 VITALS — BP 124/86 | HR 80 | Temp 98.4°F | Resp 12 | Ht 64.5 in | Wt 164.4 lb

## 2011-04-18 DIAGNOSIS — R1032 Left lower quadrant pain: Secondary | ICD-10-CM | POA: Insufficient documentation

## 2011-04-18 NOTE — Patient Instructions (Signed)
We will arrange an CT scan to evaluate your abdominal pain

## 2011-04-18 NOTE — Progress Notes (Signed)
NAME: Lauren Fuller                                                                                      DOB: 30-Jan-1956 DATE: 04/18/2011               MRN: [id number]   CC:  Chief Complaint  Patient presents with  . Inguinal Hernia    LIH- new pt    HPI:  Lauren Fuller is a 56 y.o.  female who was referred  by Dr. Lazarus Gowda evaluation of Left lower quadrant abdominal pain. She recently saw her plastic surgeon who has done a TRAM flap on her and he thought she might have a hernia.  She's really been having pain in this area for a couple of years but it seems to be getting worse. She has pain basically in the left inguinal area and perhaps just a little bit above that. She sometimes has pain with intercourse. It is always localized in the same area. She sometimes feels something in the area but hasn't really had a definite bulge to clearly indicate a hernia. It seems to come and go and be more present which is more active.  PMH:  has a past medical history of ALLERGIC RHINITIS (10/27/2006); BREAST CANCER, HX OF (10/27/2006); GERD (10/27/2006); HYPERLIPIDEMIA (10/27/2006); HYPERTENSION (10/27/2006); PULMONARY EMBOLISM, HX OF (10/27/2006); RENAL INSUFFICIENCY (08/01/2009); Migraines; Anemia; and Clotting disorder.  PSH:   has past surgical history that includes Abdominal hysterectomy; Cholecystectomy; Breast surgery; Tubal ligation; Cesarean section; Nissen fundoplication; Knee surgery; and Vocal cord injection.  ALLERGIES:   Allergies  Allergen Reactions  . Aspirin-Acetaminophen-Caffeine     excedrin REACTION: makes me really nervous inside  . Codeine     REACTION: itching  . Hydromorphone Hcl     REACTION: not effective  . Pentazocine Lactate     REACTION: hallucinations  . Talwin     Makes pt feel drunk    MEDICATIONS: Current outpatient prescriptions:ALPRAZolam (XANAX) 0.25 MG tablet, Take 0.5 mg by mouth at bedtime.  , Disp: , Rfl: ;  atorvastatin (LIPITOR) 40 MG tablet, TAKE 1 TABLET  DAILY, Disp: 90 tablet, Rfl: 3;  donepezil (ARICEPT) 5 MG tablet, Take 5 mg by mouth at bedtime as needed., Disp: , Rfl: ;  Glucosamine-Chondroit-Vit C-Mn (GLUCOSAMINE 1500 COMPLEX) CAPS, Take by mouth daily., Disp: , Rfl:  letrozole (FEMARA) 2.5 MG tablet, Take 2.5 mg by mouth daily., Disp: , Rfl: ;  meloxicam (MOBIC) 7.5 MG tablet, Take 7.5 mg by mouth daily.  , Disp: , Rfl: ;  methocarbamol (ROBAXIN) 500 MG tablet, Take 500 mg by mouth 4 (four) times daily as needed.  , Disp: , Rfl: ;  Multiple Vitamin (MULTIVITAMIN) capsule, Take 1 capsule by mouth daily., Disp: , Rfl:  Nutritional Supplements (MELATONIN PO), Take 3 mg by mouth at bedtime and may repeat dose one time if needed., Disp: , Rfl: ;  omeprazole-sodium bicarbonate (ZEGERID) 40-1100 MG per capsule, Take 1 capsule by mouth 2 (two) times daily.  , Disp: , Rfl: ;  Polyethylene Glycol 3350 (MIRALAX PO), Take by mouth as needed., Disp: , Rfl: ;  propranolol (INDERAL) 60  MG tablet, Take 60 mg by mouth 2 (two) times daily., Disp: , Rfl:  venlafaxine (EFFEXOR-XR) 150 MG 24 hr capsule, Take 150 mg by mouth daily., Disp: , Rfl:   ROS: She has filled out our 12 point review of systems and it is negative except for abdominal pain and occasional constipation and trouble some migraines   EXAM:   Gen.: The patient is alert oriented healthy appearing. Abdomen: There is a well-healed surgical scar from her TRAM flap. Her umbilicus is somewhat to the right of midline. She's not had any abdominal masses and there is no hepatosplenomegaly noted. She has a little bit of tenderness in the inguinal area or perhaps to slightly above the inguinal canal. I do not feel a bulge.  When she stands and coughs I do not feel an impact to suggest a hernia. The incision appears solid. I do not appreciate any subcutaneous or abdominal masses as the cause of her pain.  DATA REVIEWED:Epic notes and mynold paper chrtIMPRESSION:    Persistent and worsening left lower quadrant  abdominal pain without clear etiology. She notes that she has had a colonoscopy within the last year or two which was negative.   PLAN:   Currently I do not see a surgical indication. I am concerned that she may have a small hernia that I am not able to detect. I think a CT of the abdomen and pelvis with be helpful I also would rule out any intra-abdominal process.  Leonda Cristo J 04/18/2011  CC: Debria Garret*, Rogelia Boga, MD, MD

## 2011-04-21 ENCOUNTER — Other Ambulatory Visit: Payer: Self-pay | Admitting: *Deleted

## 2011-04-21 ENCOUNTER — Other Ambulatory Visit (HOSPITAL_BASED_OUTPATIENT_CLINIC_OR_DEPARTMENT_OTHER): Payer: Managed Care, Other (non HMO) | Admitting: Lab

## 2011-04-21 DIAGNOSIS — Z853 Personal history of malignant neoplasm of breast: Secondary | ICD-10-CM

## 2011-04-21 DIAGNOSIS — I10 Essential (primary) hypertension: Secondary | ICD-10-CM

## 2011-04-21 DIAGNOSIS — E785 Hyperlipidemia, unspecified: Secondary | ICD-10-CM

## 2011-04-21 LAB — CBC WITH DIFFERENTIAL/PLATELET
BASO%: 0.5 % (ref 0.0–2.0)
EOS%: 0.7 % (ref 0.0–7.0)
LYMPH%: 33.2 % (ref 14.0–49.7)
MCHC: 34.5 g/dL (ref 31.5–36.0)
MONO#: 0.5 10*3/uL (ref 0.1–0.9)
Platelets: 195 10*3/uL (ref 145–400)
RBC: 3.82 10*6/uL (ref 3.70–5.45)
WBC: 6.2 10*3/uL (ref 3.9–10.3)

## 2011-04-21 LAB — COMPREHENSIVE METABOLIC PANEL
ALT: 27 U/L (ref 0–35)
AST: 18 U/L (ref 0–37)
Alkaline Phosphatase: 80 U/L (ref 39–117)
CO2: 24 mEq/L (ref 19–32)
Sodium: 140 mEq/L (ref 135–145)
Total Bilirubin: 0.3 mg/dL (ref 0.3–1.2)
Total Protein: 6 g/dL (ref 6.0–8.3)

## 2011-04-23 ENCOUNTER — Ambulatory Visit
Admission: RE | Admit: 2011-04-23 | Discharge: 2011-04-23 | Disposition: A | Discharge status: Home / Self Care | Payer: Managed Care, Other (non HMO) | Source: Ambulatory Visit | Attending: Surgery | Admitting: Surgery

## 2011-04-23 MED ORDER — IOHEXOL 300 MG/ML  SOLN
100.0000 mL | Freq: Once | INTRAMUSCULAR | Status: AC | PRN
  Administered 2011-04-23: 100 mL via INTRAVENOUS

## 2011-04-24 ENCOUNTER — Telehealth (INDEPENDENT_AMBULATORY_CARE_PROVIDER_SITE_OTHER): Payer: Self-pay | Admitting: General Surgery

## 2011-04-24 NOTE — Telephone Encounter (Signed)
Message copied by Liliana Cline on Thu Apr 24, 2011  8:54 AM ------      Message from: Currie Paris      Created: Wed Apr 23, 2011  5:55 PM       Let her know the CT scan shows no hernia and no evidence of a recurrent cancer or other problem. It doesn't explain her pain, but there is nothing that we can help with surgery. She may need to follow up with a gastroenterologist. Let me know if she has any questions

## 2011-04-24 NOTE — Telephone Encounter (Signed)
Called patient and made her aware that CT was negative and if pain continues she should see a GI doctor. See understands and will call if needed.

## 2011-04-28 ENCOUNTER — Telehealth: Payer: Self-pay | Admitting: Oncology

## 2011-04-28 ENCOUNTER — Encounter: Payer: Self-pay | Admitting: *Deleted

## 2011-04-28 ENCOUNTER — Ambulatory Visit (HOSPITAL_BASED_OUTPATIENT_CLINIC_OR_DEPARTMENT_OTHER): Payer: BC Managed Care – PPO | Admitting: Oncology

## 2011-04-28 VITALS — BP 125/82 | HR 73 | Temp 97.3°F | Ht 64.5 in | Wt 165.5 lb

## 2011-04-28 DIAGNOSIS — Z853 Personal history of malignant neoplasm of breast: Secondary | ICD-10-CM

## 2011-04-28 NOTE — Progress Notes (Signed)
ID: Lauren Fuller  DOB: 09-23-1955  MR#: [id number]  CSN#: [id number]   Interval History:   Keenya returns today for followup of her breast cancer. The interval history is complex. She tells me her migraines have gotten so bad that she is in constant pain at a 5-7 level. She has become very depressed and is thinking of suicide. She tells me her psychiatrist and her husband both are aware of this.  ROS:  Parys tells me as far as her migraines are concerned she's been referred to Dr. Joycie Peek at [facility]. She had an intravenous treatment there which she says work for about 5 days but now the headaches are back. She describes herself as mildly fatigued. She has significant insomnia problems. She is having some sinus problems as well and some epistaxis. She says her migraines closer teeth to hurt. Recently she had an episode of left pelvic pain. This could occur with intercourse at, or with bowel movements, or in certain positions, perhaps when she reached for something more bent over. She had a CT scan of the abdomen and pelvis which was unremarkable (copied below). She has some urinary leakage, some chronic back pain, some visual changes from her migraines, but the main issue continues to be problems with memory, inability to plan, and now severe migraine pain with depression and suicidal ideation.  Allergies  Allergen Reactions  . Aspirin-Acetaminophen-Caffeine     excedrin REACTION: makes me really nervous inside  . Codeine     REACTION: itching  . Hydromorphone Hcl     REACTION: not effective  . Pentazocine Lactate     REACTION: hallucinations  . Talwin     Makes pt feel drunk    Current Outpatient Prescriptions  Medication Sig Dispense Refill  . ALPRAZolam (XANAX) 0.25 MG tablet Take 0.5 mg by mouth at bedtime.        Marland Kitchen atorvastatin (LIPITOR) 40 MG tablet TAKE 1 TABLET DAILY  90 tablet  3  . Glucosamine-Chondroit-Vit C-Mn (GLUCOSAMINE 1500 COMPLEX) CAPS Take by mouth daily.      Marland Kitchen  letrozole (FEMARA) 2.5 MG tablet Take 2.5 mg by mouth daily.      . meloxicam (MOBIC) 7.5 MG tablet Take 7.5 mg by mouth daily.        . methocarbamol (ROBAXIN) 500 MG tablet Take 500 mg by mouth 4 (four) times daily as needed.        . Multiple Vitamin (MULTIVITAMIN) capsule Take 1 capsule by mouth daily.      . Nutritional Supplements (MELATONIN PO) Take 3 mg by mouth at bedtime and may repeat dose one time if needed.      Marland Kitchen omeprazole-sodium bicarbonate (ZEGERID) 40-1100 MG per capsule Take 1 capsule by mouth 2 (two) times daily.        . Polyethylene Glycol 3350 (MIRALAX PO) Take by mouth as needed.      . propranolol (INDERAL) 60 MG tablet Take 60 mg by mouth 2 (two) times daily.      Marland Kitchen venlafaxine (EFFEXOR-XR) 150 MG 24 hr capsule Take 150 mg by mouth daily.      Marland Kitchen donepezil (ARICEPT) 5 MG tablet Take 5 mg by mouth at bedtime as needed.       PAST MEDICAL HISTORY:  The patient's father is alive at age 77.  The patient's mother is alive at age 6.  The patient has one brother and one sister.  There is no history in the immediate family of breast or  ovarian cancer. One cousin was diagnosed with breast cancer at age 18.    GYNECOLOGIC HISTORY:  She is GX, P2.  Last menstrual period was 2002 when the patient had a hysterectomy. Both ovaries have been removed .    PAST MEDICAL HISTORY: Significant for psoriasis, history of DVT with pulmonary embolism in November 2002 involving the right lower lobe, a history of hypercholesterolemia, a history of GERD which was treated with a fundal wrap which means she is physically unable to vomit.  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 Her husband, Orvilla Fus,  is a Environmental consultant for [employer].  Their daughter, Ander Slade, is studying business  at [employer].  They have a son, Maisie Fus, who is mentally retarded with a seizure disorder.  The patient is a member of the [employer].    Objective:  There were no vitals filed for this visit.  BMI: There is no height or weight on file to calculate BMI.   ECOG FS: 2  Physical Exam:   Sclerae unicteric  Oropharynx clear  No peripheral adenopathy  Lungs clear -- no rales or rhonchi  Heart regular rate and rhythm  Abdomen soft, bowel sounds positive, no masses palpated, no tenderness in the left lower quadrant particularly  MSK no focal spinal tenderness, no peripheral edema  Neuro nonfocal  Breast exam: She status post bilateral mastectomies, with TRAM reconstruction on the right and an implant on the left. There is no evidence of local recurrence.  Lab Results:      Chemistry      Component Value Date/Time   NA 140 04/21/2011 1042   K 4.1 04/21/2011 1042   CL 106 04/21/2011 1042   CO2 24 04/21/2011 1042   BUN 19 04/21/2011 1042   CREATININE 0.80 04/21/2011 1042      Component Value Date/Time   CALCIUM 8.8 04/21/2011 1042   ALKPHOS 80 04/21/2011 1042   AST 18 04/21/2011 1042   ALT 27 04/21/2011 1042   BILITOT 0.3 04/21/2011 1042       Lab Results  Component Value Date   WBC 6.2 04/21/2011   HGB 12.1 04/21/2011   HCT 35.2 04/21/2011   MCV 92.1 04/21/2011   PLT 195 04/21/2011   NEUTROABS 3.6 04/21/2011    Studies/Results:  Ct Abdomen Pelvis W Contrast  04/23/2011  *RADIOLOGY REPORT*  Clinical Data: Left lower quadrant abdominal pain.  CT ABDOMEN AND PELVIS WITH CONTRAST   Findings: The lung bases are clear.  The liver is unremarkable.  There is mild diffuse fatty infiltration which is stable.  There is a small stable low attenuation lesion in the left lobe which is likely a benign cyst. The gallbladder is surgically absent.  No common bile duct dilatation.  The pancreas is normal.  The spleen is normal in size. No focal lesions.  The adrenal glands and kidneys are normal and stable.  The  surgical changes noted at the GE junction with probable changes of a Nissen fundoplication.  No complicating features are demonstrated.  The duodenum, small bowel and colon are unremarkable.  No mass lesions or inflammatory changes.  No mesenteric or retroperitoneal masses or lymphadenopathy.  The aorta is normal in caliber.  Mild tortuosity.  The major branch vessels are patent.  The portal and splenic veins are patent.  The uterus is surgically absent.  The ovaries are not visualized. No pelvic mass, adenopathy or free pelvic fluid collections.  No inguinal mass or hernia.  There are surgical changes involving the anterior abdominal wall.  The bony structures are unremarkable.  Moderate degenerative disc disease noted at L5-S1.  IMPRESSION: No acute abdominal/pelvic findings, mass lesions or lymphadenopathy.  Original Report Authenticated By: P. Loralie Champagne, M.D.    Assessment: 56 year old [place] woman   (1) status post right lumpectomy and sentinel lymph node biopsy August 2007 for a clinically 4.8-cm (cT2 cN0 or stage cIIA) adenoid cystic/ invasive ductal carcinoma   (2) treated adjuvantly with cyclophosphamide and doxorubicin x3 followed by cyclophosphamide and docetaxel x1  (3) followed by right simple mastectomy with sentinel lymph node sampling (July 2008) with an 8-mm area of residual disease, 0/2 sentinel lymph nodes involved, grade 1, estrogen and progesterone receptor 83% and 94% respectively, HERceptest negative with a low proliferation fraction.   (4) She underwent left simple mastectomy with bilateral reconstruction in October 2008 and had benign pathology at that time.    (5) She has been on adjuvant anastrozole since June 2008.    (6) She is s/p hysterectomy and BSO.   (7) She has a remote history of DVT.  Plan: I have suggested that Alesha be seen at behavioral health today and she agrees. I have asked our social worker to operationalize that and she is meeting with Tyler Aas  at this point.  From a breast cancer point of view it I do not see any evidence of disease recurrence. The plan is to continue letrozole at least for 5 years, with consideration of 10 years.  She will need a repeat DEXA scan sometime before her next visit here, which will be in 3 months.  Ariyon brought me disability application, which I would be glad to fill out, except that she does not have active cancer at present, and it might be better if it were filled out by one of our neurologists. She will let me know if she is still a would prefer we complete this for her.  She is having active neurologic and psychiatric followup, and I am copying her physicians, but not making any changes in her medications at present. She knows to call for any problems that we may be able to assist her with.      MAGRINAT,GUSTAV C 04/28/2011

## 2011-04-28 NOTE — Progress Notes (Signed)
[facility] Suicide Risk Assessment Clinical Social Work  Clinical Social Work was referred by Dr. Darnelle Catalan for suicidal ideation.  CSW met with patient in exam room to complete assessment. The patient states she has had increasing thoughts that have begun due to major stressors in her life including intense, chronic migraines and caring for her adult son with MR and a seizure disorder.  The patient states she often has thoughts of driving her car into a tree or shooting herself with one of her spouse's guns.  The patient reports the guns are currently locked away and she is unable to reach them.  The patient's spouse and Dr. Nolen Mu, the patient's psychiatrist, are both of aware of patient's current suicidal ideations. Ms. Snuffer reports she is ready to die, but God is not ready to let her go.  We processed this statement at great length.  She views her faith, her relationships with her family, and her son as the things that are "keeping her here".  She states she relies heavily on her relationship with God and understands "He will take her when He is ready."  She fears that her stress and severe pain will cause her to act impulsively and she will hurt herself.  The patient states she has increasing thoughts, but no plan at this time.  The patient was not agreeable to go to the ED for further assessment, therefore, we agreed to create a safety plan and patient has appointment with her psychiatrist in the morning.  CSW also encouraged Ms. Potvin to seek counseling in between psychiatrist appointments(every 3 months) to receive additional support and develop positive coping skills.  Clinical Child psychotherapist and patient discussed plan and completed Engineer, manufacturing systems. The patient identified the following plan of action which patient agrees to utilize should suicidal ideation increase occur: 1) Contact Tommy (spouse) or 2) Contact Dr. Loralie Champagne office  or 3) Go to [facility] for psychiatric  evaluation or call 911   MD notified of safety contract.  Safety contract has been sent to medical records department to be scanned in to patients medical record.  Kathrin Penner, MSW, [facility] Clinical Social Worker [facility] [phone]

## 2011-04-28 NOTE — Telephone Encounter (Signed)
lmonvm adviisng the pt of her may 2013 appt with dr Darnelle Catalan.

## 2011-05-22 ENCOUNTER — Encounter: Payer: Self-pay | Admitting: Oncology

## 2011-06-11 ENCOUNTER — Telehealth: Payer: Self-pay | Admitting: Internal Medicine

## 2011-06-11 NOTE — Telephone Encounter (Signed)
Call from Dr. Allyne Gee from [facility] retinal specialists.  Patient has bilateral retinal detachment and will require surgery tomorrow.  Dr. Allyne Gee would like medical consult re:  Anticoagulation during and post surgery.  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  Please call Dr. Allyne Gee in the morning.  [phone]

## 2011-06-12 ENCOUNTER — Telehealth: Payer: Self-pay | Admitting: Internal Medicine

## 2011-06-12 ENCOUNTER — Other Ambulatory Visit: Payer: Self-pay | Admitting: Internal Medicine

## 2011-06-12 MED ORDER — ENOXAPARIN SODIUM 40 MG/0.4ML ~~LOC~~ SOLN: 40.0000 mg | Freq: Every day | SUBCUTANEOUS | Status: DC

## 2011-06-12 NOTE — Telephone Encounter (Signed)
rx sent to [facility] - Dr. Zella Ball office notified of rx and instructions

## 2011-06-12 NOTE — Telephone Encounter (Signed)
[phone] 

## 2011-06-12 NOTE — Telephone Encounter (Signed)
Pt is currently having surgery and will be going home today and has a history of blood clots and is going to need medications to prevent it.pt requesting lovenox. Please contact husband asap!  Please call husband at [phone] [facility] [address]

## 2011-06-12 NOTE — Telephone Encounter (Signed)
This phone number  is not in service;  please attempt  to call Dr. Lelon Perla. Patient has a remote history of pulmonary embolism and no special perioperative prophylaxis indicated. No contraindications to the anticipated surgery

## 2011-06-12 NOTE — Telephone Encounter (Signed)
Attempt to call [name] - VM - left instructions that I have already sent lovenox to [facility] - if he calls I will take his call

## 2011-07-22 ENCOUNTER — Ambulatory Visit (HOSPITAL_BASED_OUTPATIENT_CLINIC_OR_DEPARTMENT_OTHER): Payer: BC Managed Care – PPO | Admitting: Oncology

## 2011-07-22 VITALS — BP 136/82 | HR 94 | Temp 97.5°F | Ht 64.5 in | Wt 158.8 lb

## 2011-07-22 DIAGNOSIS — Z853 Personal history of malignant neoplasm of breast: Secondary | ICD-10-CM

## 2011-07-22 DIAGNOSIS — Z86718 Personal history of other venous thrombosis and embolism: Secondary | ICD-10-CM

## 2011-07-22 DIAGNOSIS — Z170 Estrogen receptor positive status [ER+]: Secondary | ICD-10-CM

## 2011-07-22 DIAGNOSIS — C50419 Malignant neoplasm of upper-outer quadrant of unspecified female breast: Secondary | ICD-10-CM

## 2011-07-22 NOTE — Progress Notes (Signed)
ID: Marlow Hendrie Melland   DOB: 12-07-1955  MR#: [id number]  JYN#:[id number]  HISTORY OF PRESENT ILLNESS: Norris had a mammogram in December 2004 at [facility] which showed some suspicious changes leading to further views and an ultrasound guided biopsy which, however, only showed benign tissue with fibrosis, no evidence of malignancy ([id number]).  She then had a mammogram in April 2006 which showed no focal area of concern for carcinoma.    More recently however, right mammogram and ultrasound obtained on 09-29-05 showed architectural distortion with microcalcifications in the right breast suspicious for carcinoma.  This led to an ultrasound guided biopsy at [facility] on 10-16-05 and that showed ([id number]) an invasive ductal carcinoma which was strongly ER and PR positive with a low proliferation marker at 7%, HER-2/neu negative at 1%.    With this information, the patient was referred to Dr. Jamey Ripa and on 10-24-05, bilateral breast MRI's were obtained which showed a solitary lesion in the upper outer quadrant of the right breast measuring up to 3.4 cm.    With this information, the patient proceeded to definitive local surgery with right lumpectomy and sentinel lymph node biopsy on 10-31-05.  The final pathology [id number]) showed an invasive ductal carcinoma which was at least 4.8 cm., with some lobular features but positive for E-cadherin, and with some adenoid cystic areas.  Overall the tumor was a grade 1, but there was evidence of lymphovascular invasion and the inferior margin was positive. Her subsequent history is as detailed below.  INTERVAL HISTORY: Sallyanne returns today for routine followup of her breast cancer. The interval history is stable. She had eye surgery on the right secondary to a tear and she tells me as she will need surgery to the left side as well.  REVIEW OF SYSTEMS: She sleeps poorly, is fatigued, is being treated for migraines, and has some back pain  intermittently, which has not changed in intensity. She has a little bit of a running noted and sore throat which she frequently does this time of year she tells me. She continues to be forgetful, anxious and depressed, but not suicidal. She is seeing Dr. Emerson Monte for this. She is somewhat constipated. 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 Currently his Port-A-Cath needs changing and there are problems with the CAP worker. She still has a great deal of difficulty concentrating and accomplishing multi-step tasks. A detailed review of systems was otherwise stable  PAST MEDICAL HISTORY: Past Medical History  Diagnosis Date  . ALLERGIC RHINITIS 10/27/2006  . BREAST CANCER, HX OF 10/27/2006  . GERD 10/27/2006  . HYPERLIPIDEMIA 10/27/2006  . HYPERTENSION 10/27/2006  . PULMONARY EMBOLISM, HX OF 10/27/2006  . RENAL INSUFFICIENCY 08/01/2009  . Migraines   . Anemia   . Clotting disorder     PAST SURGICAL HISTORY: Past Surgical History  Procedure Date  . Abdominal hysterectomy   . Cholecystectomy   . Breast surgery     mastectomy  . Tubal ligation   . Cesarean section   . Nissen fundoplication   . Knee surgery     left  . Vocal cord injection   Significant for psoriasis, history of DVT with pulmonary embolism in November 2002 involving the right lower lobe, a history of hypercholesterolemia, a history of GERD which was treated with a fundal wrap which means she is physically unable to vomit.  She  is status post hysterectomy with right salpingo-oophorectomy, status post cholecystectomy, status post tubal ligation, status post C-section and status post cholecystectomy.  FAMILY HISTORY Family History  Problem Relation Age of Onset  . Heart disease Mother   . Heart disease Father   . Cancer Maternal Uncle     leukemia  . Cancer Sister     lung  The patient's father is alive at age 18.  The  patient's mother is alive at age 50.  The patient has one brother and one sister.  There is no history in the immediate family of breast or ovarian cancer. One cousin was diagnosed with breast cancer at age 20.    GYNECOLOGIC HISTORY: She is GX, P2.  Last menstrual period was 2002 when the patient had a hysterectomy, but she has one ovary in place   SOCIAL HISTORY: Shandricka taught second grade.  Her husband, Orvilla Fus,  is a Environmental consultant for [employer].  Their daughter, Ander Slade, is studying business at [employer].  They have a son, Maisie Fus,  who is mentally retarded with a seizure disorder.  The patient is a member of the [employer].     ADVANCED DIRECTIVES: In place  HEALTH MAINTENANCE: History  Substance Use Topics  . Smoking status: Never Smoker   . Smokeless tobacco: Never Used  . Alcohol Use: No     Colonoscopy:  PAP:  Bone density:  Lipid panel:  Allergies  Allergen Reactions  . Aspirin-Acetaminophen-Caffeine     excedrin REACTION: makes me really nervous inside  . Codeine     REACTION: itching  . Hydromorphone Hcl     REACTION: not effective  . Pentazocine Lactate     REACTION: hallucinations  . Pentazocine Lactate     Makes pt feel drunk    Current Outpatient Prescriptions  Medication Sig Dispense Refill  . ALPRAZolam (XANAX) 0.25 MG tablet Take 0.5 mg by mouth at bedtime.        Marland Kitchen atorvastatin (LIPITOR) 40 MG tablet TAKE 1 TABLET DAILY  90 tablet  3  . donepezil (ARICEPT) 5 MG tablet Take 5 mg by mouth at bedtime as needed.      . Glucosamine-Chondroit-Vit C-Mn (GLUCOSAMINE 1500 COMPLEX) CAPS Take by mouth daily.      Marland Kitchen letrozole (FEMARA) 2.5 MG tablet Take 2.5 mg by mouth daily.      . meloxicam (MOBIC) 7.5 MG tablet Take 7.5 mg by mouth daily.        . methocarbamol (ROBAXIN) 500 MG tablet Take 500 mg by mouth 4 (four) times daily as needed.        . Multiple Vitamin (MULTIVITAMIN) capsule Take 1 capsule by mouth daily.      . naratriptan (AMERGE) 2.5 MG tablet  Take 2.5 mg by mouth as needed. Take one (1) tablet at onset of headache; if returns or does not resolve, may repeat after 4 hours; do not exceed five (5) mg in 24 hours.      . Nutritional Supplements (MELATONIN PO) Take 3 mg by mouth at bedtime and may repeat dose one time if needed.      Marland Kitchen omeprazole-sodium bicarbonate (ZEGERID) 40-1100 MG per capsule Take 1 capsule by mouth 2 (two) times daily.        . Polyethylene Glycol 3350 (MIRALAX PO) Take by mouth as needed.      . temazepam (RESTORIL) 22.5 MG capsule Take 22.5 mg by mouth at bedtime as needed.      . venlafaxine (EFFEXOR-XR)  150 MG 24 hr capsule Take 75 mg by mouth 3 (three) times daily.       Marland Kitchen zonisamide (ZONEGRAN) 100 MG capsule Take 100 mg by mouth 4 (four) times daily as needed.      . enoxaparin (LOVENOX) 40 MG/0.4ML injection Inject 0.4 mLs (40 mg total) into the skin daily.  8 Syringe  0  . propranolol (INDERAL) 60 MG tablet Take 60 mg by mouth 2 (two) times daily.        OBJECTIVE: Middle-aged white woman in no acute distress Filed Vitals:   07/22/11 1137  BP: 136/82  Pulse: 94  Temp: 97.5 F (36.4 C)     Body mass index is 26.84 kg/(m^2).    ECOG FS: 2  Sclerae unicteric Oropharynx clear No peripheral adenopathy Lungs no rales or rhonchi Heart regular rate and rhythm Abd benign MSK no focal spinal tenderness, no peripheral edema Neuro: nonfocal Breasts: She is status post bilateral mastectomies. There is no evidence of local recurrence  LAB RESULTS: Lab Results  Component Value Date   WBC 6.2 04/21/2011   NEUTROABS 3.6 04/21/2011   HGB 12.1 04/21/2011   HCT 35.2 04/21/2011   MCV 92.1 04/21/2011   PLT 195 04/21/2011      Chemistry      Component Value Date/Time   NA 140 04/21/2011 1042   K 4.1 04/21/2011 1042   CL 106 04/21/2011 1042   CO2 24 04/21/2011 1042   BUN 19 04/21/2011 1042   CREATININE 0.80 04/21/2011 1042      Component Value Date/Time   CALCIUM 8.8 04/21/2011 1042   ALKPHOS 80 04/21/2011 1042   AST 18  04/21/2011 1042   ALT 27 04/21/2011 1042   BILITOT 0.3 04/21/2011 1042       Lab Results  Component Value Date   LABCA2 11 01/15/2009    No components found with this basename: [id number]    No results found for this basename: INR:1;PROTIME:1 in the last 168 hours  Urinalysis    Component Value Date/Time   COLORURINE YELLOW 12/17/2006 1342   APPEARANCEUR CLEAR 12/17/2006 1342   LABSPEC 1.014 12/17/2006 1342   PHURINE 7.5 12/17/2006 1342   GLUCOSEU NEGATIVE 12/17/2006 1342   HGBUR NEGATIVE 12/17/2006 1342   BILIRUBINUR NEGATIVE 12/17/2006 1342   KETONESUR NEGATIVE 12/17/2006 1342   PROTEINUR NEGATIVE 12/17/2006 1342   UROBILINOGEN 0.2 12/17/2006 1342   NITRITE NEGATIVE 12/17/2006 1342   LEUKOCYTESUR NEGATIVE MICROSCOPIC NOT DONE ON URINES WITH NEGATIVE PROTEIN, BLOOD, LEUKOCYTES, NITRITE, OR GLUCOSE <1000 mg/dL. 12/17/2006 1342    STUDIES: No new results found.  ASSESSMENT: 56 year old [place] woman  (1) status post right lumpectomy and sentinel lymph node biopsy August 2007 for a clinically 4.8-cm (cT2 cN0 or stage cIIA) adenoid cystic/ invasive ductal carcinoma  (2) treated adjuvantly with cyclophosphamide and doxorubicin x3 followed by cyclophosphamide and docetaxel x1  (3) followed by right simple mastectomy with sentinel lymph node sampling (July 2008) with an 8-mm area of residual disease, 0/2 sentinel lymph nodes involved, grade 1, estrogen and progesterone receptor 83% and 94% respectively, HERceptest negative with a low proliferation fraction.  (4) She underwent left simple mastectomy with bilateral reconstruction in October 2008 and had benign pathology at that time.  (5) She has been on adjuvant anastrozole since June 2008.  (6) She is s/p hysterectomy and BSO.  (7) She has a remote history of DVT. 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 Of course she knows that we'll be glad to see her at any point if and when the need arises. She was very comfortable with this idea and look forward to "acting out of the cancer world". All she will need in the future regarding breast cancer screening is a yearly physical exam of her chest wall scars   Kadarius Cuffe C    07/22/2011

## 2011-07-24 ENCOUNTER — Encounter: Payer: Self-pay | Admitting: Internal Medicine

## 2011-07-24 ENCOUNTER — Ambulatory Visit (INDEPENDENT_AMBULATORY_CARE_PROVIDER_SITE_OTHER): Payer: BC Managed Care – PPO | Admitting: Internal Medicine

## 2011-07-24 VITALS — BP 100/70 | Temp 98.4°F | Wt 158.0 lb

## 2011-07-24 DIAGNOSIS — J029 Acute pharyngitis, unspecified: Secondary | ICD-10-CM

## 2011-07-24 MED ORDER — ACYCLOVIR 200 MG PO CAPS: 200.0000 mg | ORAL_CAPSULE | Freq: Three times a day (TID) | ORAL | Status: AC

## 2011-07-24 NOTE — Patient Instructions (Signed)
Cepastat lozengers as directed  Call or return to clinic prn if these symptoms worsen or fail to improve as anticipated.

## 2011-07-24 NOTE — Progress Notes (Signed)
  Subjective:    Patient ID: Lauren Fuller, female    DOB: Nov 14, 1955, 56 y.o.   MRN: [id number]  HPI  56 year old patient who presents with a two-week history of intermittent sore throat. Her symptoms have improved. She has noted multiple small oral ulcerations. There's been no fever.    Review of Systems  Constitutional: Negative.   HENT: Positive for sore throat. Negative for hearing loss, congestion, rhinorrhea, dental problem, sinus pressure and tinnitus.   Eyes: Negative for pain, discharge and visual disturbance.  Respiratory: Negative for cough and shortness of breath.   Cardiovascular: Negative for chest pain, palpitations and leg swelling.  Gastrointestinal: Negative for nausea, vomiting, abdominal pain, diarrhea, constipation, blood in stool and abdominal distention.  Genitourinary: Negative for dysuria, urgency, frequency, hematuria, flank pain, vaginal bleeding, vaginal discharge, difficulty urinating, vaginal pain and pelvic pain.  Musculoskeletal: Negative for joint swelling, arthralgias and gait problem.  Skin: Negative for rash.  Neurological: Negative for dizziness, syncope, speech difficulty, weakness, numbness and headaches.  Hematological: Negative for adenopathy.  Psychiatric/Behavioral: Negative for behavioral problems, dysphoric mood and agitation. The patient is not nervous/anxious.        Objective:   Physical Exam  Constitutional: She is oriented to person, place, and time. She appears well-developed and well-nourished.  HENT:  Head: Normocephalic.  Right Ear: External ear normal.  Left Ear: External ear normal.       3 small superficial ulcerations involving the left posterior pharyngeal arch  Eyes: Conjunctivae and EOM are normal. Pupils are equal, round, and reactive to light.  Neck: Normal range of motion. Neck supple. No thyromegaly present.  Cardiovascular: Normal rate, regular rhythm, normal heart sounds and intact distal pulses.   Pulmonary/Chest:  Effort normal and breath sounds normal.  Abdominal: Soft. Bowel sounds are normal. She exhibits no mass. There is no tenderness.  Musculoskeletal: Normal range of motion.  Lymphadenopathy:    She has no cervical adenopathy.  Neurological: She is alert and oriented to person, place, and time.  Skin: Skin is warm and dry. No rash noted.  Psychiatric: She has a normal mood and affect. Her behavior is normal.          Assessment & Plan:   Sore throat secondary to oral ulceration. Will empirically give a trial of acyclovir. Will treat symptomatically with Cepastat lozengers. Will call if unimproved

## 2011-07-30 ENCOUNTER — Ambulatory Visit (INDEPENDENT_AMBULATORY_CARE_PROVIDER_SITE_OTHER): Payer: BC Managed Care – PPO | Admitting: Internal Medicine

## 2011-07-30 ENCOUNTER — Encounter: Payer: Self-pay | Admitting: Internal Medicine

## 2011-07-30 VITALS — BP 120/80 | Temp 97.8°F | Wt 159.0 lb

## 2011-07-30 DIAGNOSIS — J029 Acute pharyngitis, unspecified: Secondary | ICD-10-CM

## 2011-07-30 DIAGNOSIS — I10 Essential (primary) hypertension: Secondary | ICD-10-CM

## 2011-07-31 ENCOUNTER — Encounter: Payer: Self-pay | Admitting: Internal Medicine

## 2011-07-31 NOTE — Patient Instructions (Signed)
Get plenty of rest, Drink lots of  clear liquids, and use Tylenol or ibuprofen for fever and discomfort.    Return in 6 months for follow-up  

## 2011-07-31 NOTE — Progress Notes (Signed)
  Subjective:    Patient ID: Lauren Fuller, female    DOB: 11/06/1955, 56 y.o.   MRN: [id number]  HPI  56 year old patient who presents today with a chief complaint of persistent sore throat. She was seen last week with some aphthous ulceration. She has been treated symptomatically and also treated with a short course of acyclovir. She states that while on this medication she had no recurrence of her chronic daily headaches. She feels well except for the sore throat. There has been no fever. She has treated hypertension which has been stable    Review of Systems  Constitutional: Negative.   HENT: Positive for sore throat. Negative for hearing loss, congestion, rhinorrhea, dental problem, sinus pressure and tinnitus.   Eyes: Negative for pain, discharge and visual disturbance.  Respiratory: Negative for cough and shortness of breath.   Cardiovascular: Negative for chest pain, palpitations and leg swelling.  Gastrointestinal: Negative for nausea, vomiting, abdominal pain, diarrhea, constipation, blood in stool and abdominal distention.  Genitourinary: Negative for dysuria, urgency, frequency, hematuria, flank pain, vaginal bleeding, vaginal discharge, difficulty urinating, vaginal pain and pelvic pain.  Musculoskeletal: Negative for joint swelling, arthralgias and gait problem.  Skin: Negative for rash.  Neurological: Negative for dizziness, syncope, speech difficulty, weakness, numbness and headaches.  Hematological: Negative for adenopathy.  Psychiatric/Behavioral: Negative for behavioral problems, dysphoric mood and agitation. The patient is not nervous/anxious.        Objective:   Physical Exam  Constitutional: She is oriented to person, place, and time. She appears well-developed and well-nourished.  HENT:  Head: Normocephalic.  Right Ear: External ear normal.  Left Ear: External ear normal.  Mouth/Throat: No oropharyngeal exudate.       Mild erythema no obvious ulcerations noted at  this time  Eyes: Conjunctivae and EOM are normal. Pupils are equal, round, and reactive to light.  Neck: Normal range of motion. Neck supple. No thyromegaly present.  Cardiovascular: Normal rate, regular rhythm, normal heart sounds and intact distal pulses.   Pulmonary/Chest: Effort normal and breath sounds normal.  Abdominal: Soft. Bowel sounds are normal. She exhibits no mass. There is no tenderness.  Musculoskeletal: Normal range of motion.  Lymphadenopathy:    She has no cervical adenopathy.  Neurological: She is alert and oriented to person, place, and time.  Skin: Skin is warm and dry. No rash noted.  Psychiatric: She has a normal mood and affect. Her behavior is normal.          Assessment & Plan:   Resolving pharyngitis. Will continue symptomatic treatment Hypertension stable  Recheck 6 months or as needed

## 2011-08-06 ENCOUNTER — Telehealth: Payer: Self-pay | Admitting: Oncology

## 2011-08-06 NOTE — Telephone Encounter (Signed)
S/w pt re appt for June and per pt she was seen for f/u a couple of weeks ago and graduated (released). appt cx'd.

## 2011-09-03 ENCOUNTER — Other Ambulatory Visit: Payer: BC Managed Care – PPO | Admitting: Lab

## 2011-09-09 ENCOUNTER — Ambulatory Visit: Payer: BC Managed Care – PPO | Admitting: Oncology

## 2011-10-23 ENCOUNTER — Other Ambulatory Visit: Payer: BC Managed Care – PPO | Admitting: Lab

## 2011-10-30 ENCOUNTER — Ambulatory Visit: Payer: BC Managed Care – PPO | Admitting: Physician Assistant

## 2012-05-31 ENCOUNTER — Other Ambulatory Visit: Payer: Self-pay | Admitting: Neurology

## 2012-05-31 DIAGNOSIS — G43009 Migraine without aura, not intractable, without status migrainosus: Secondary | ICD-10-CM

## 2012-06-02 ENCOUNTER — Other Ambulatory Visit: Payer: Self-pay

## 2012-06-09 ENCOUNTER — Ambulatory Visit (INDEPENDENT_AMBULATORY_CARE_PROVIDER_SITE_OTHER): Payer: Managed Care, Other (non HMO)

## 2012-06-09 ENCOUNTER — Other Ambulatory Visit: Payer: Self-pay | Admitting: Neurology

## 2012-06-09 DIAGNOSIS — G43009 Migraine without aura, not intractable, without status migrainosus: Secondary | ICD-10-CM

## 2012-06-10 ENCOUNTER — Telehealth: Payer: Self-pay | Admitting: Neurology

## 2012-06-10 NOTE — Telephone Encounter (Signed)
Lauren Fuller, please call pt, no signficant abnormalities on MRI   MRI showed Mild enlargement of the CSF spaces over the cerebral convexities, without underlying brain atrophy. This is of unclear clinical significance. Otherwise normal MRI brain (without).  Further dicussion of MRI upon follow up visit.

## 2012-06-16 NOTE — Telephone Encounter (Signed)
Lauren Fuller, please call patient with results

## 2012-06-16 NOTE — Telephone Encounter (Signed)
Donna, please call patient with results 

## 2012-06-16 NOTE — Telephone Encounter (Signed)
Spoke to patient with MRI results, per Dr. Terrace Arabia.  Patient expressed understanding.

## 2012-07-09 DIAGNOSIS — R413 Other amnesia: Secondary | ICD-10-CM

## 2012-07-09 DIAGNOSIS — R41840 Attention and concentration deficit: Secondary | ICD-10-CM

## 2012-08-16 ENCOUNTER — Other Ambulatory Visit: Payer: Self-pay | Admitting: Neurology

## 2012-08-24 ENCOUNTER — Other Ambulatory Visit: Payer: Self-pay | Admitting: Neurology

## 2012-11-25 ENCOUNTER — Other Ambulatory Visit: Payer: Self-pay

## 2012-11-25 MED ORDER — TEMAZEPAM 22.5 MG PO CAPS: 22.5000 mg | ORAL_CAPSULE | Freq: Every evening | ORAL | Status: DC | PRN

## 2012-11-25 NOTE — Telephone Encounter (Signed)
Patient called requesting Temazepam be sent to [facility] in [facility].

## 2012-11-26 NOTE — Telephone Encounter (Signed)
Rx signed and faxed.

## 2012-11-28 ENCOUNTER — Other Ambulatory Visit: Payer: Self-pay | Admitting: Neurology

## 2012-12-14 ENCOUNTER — Other Ambulatory Visit: Payer: Self-pay | Admitting: Neurology

## 2013-02-13 ENCOUNTER — Other Ambulatory Visit: Payer: Self-pay | Admitting: Neurology

## 2013-04-01 ENCOUNTER — Other Ambulatory Visit: Payer: Self-pay | Admitting: *Deleted

## 2013-04-04 ENCOUNTER — Telehealth: Payer: Self-pay | Admitting: Oncology

## 2013-04-04 NOTE — Telephone Encounter (Signed)
lmonvm for pt re appt for 1/29. Schedule mailed.

## 2013-04-13 ENCOUNTER — Other Ambulatory Visit: Payer: Self-pay | Admitting: *Deleted

## 2013-04-14 ENCOUNTER — Other Ambulatory Visit: Payer: Self-pay | Admitting: *Deleted

## 2013-04-14 ENCOUNTER — Encounter (INDEPENDENT_AMBULATORY_CARE_PROVIDER_SITE_OTHER): Payer: Self-pay

## 2013-04-14 ENCOUNTER — Other Ambulatory Visit (HOSPITAL_BASED_OUTPATIENT_CLINIC_OR_DEPARTMENT_OTHER): Payer: Medicare Other

## 2013-04-14 ENCOUNTER — Ambulatory Visit (HOSPITAL_BASED_OUTPATIENT_CLINIC_OR_DEPARTMENT_OTHER): Payer: Medicare Other | Admitting: Hematology and Oncology

## 2013-04-14 ENCOUNTER — Telehealth: Payer: Self-pay | Admitting: Oncology

## 2013-04-14 VITALS — BP 130/76 | HR 93 | Temp 98.3°F | Resp 18 | Ht 64.5 in | Wt 166.2 lb

## 2013-04-14 DIAGNOSIS — Z86718 Personal history of other venous thrombosis and embolism: Secondary | ICD-10-CM

## 2013-04-14 DIAGNOSIS — Z853 Personal history of malignant neoplasm of breast: Secondary | ICD-10-CM

## 2013-04-14 DIAGNOSIS — C50919 Malignant neoplasm of unspecified site of unspecified female breast: Secondary | ICD-10-CM

## 2013-04-14 LAB — COMPREHENSIVE METABOLIC PANEL (CC13)
ALBUMIN: 4 g/dL (ref 3.5–5.0)
ALT: 24 U/L (ref 0–55)
ANION GAP: 9 meq/L (ref 3–11)
AST: 18 U/L (ref 5–34)
Alkaline Phosphatase: 60 U/L (ref 40–150)
BUN: 23.6 mg/dL (ref 7.0–26.0)
CALCIUM: 9.5 mg/dL (ref 8.4–10.4)
CHLORIDE: 104 meq/L (ref 98–109)
CO2: 28 meq/L (ref 22–29)
Creatinine: 0.8 mg/dL (ref 0.6–1.1)
GLUCOSE: 101 mg/dL (ref 70–140)
POTASSIUM: 4 meq/L (ref 3.5–5.1)
Sodium: 141 mEq/L (ref 136–145)
Total Bilirubin: 0.34 mg/dL (ref 0.20–1.20)
Total Protein: 6.7 g/dL (ref 6.4–8.3)

## 2013-04-14 LAB — CBC WITH DIFFERENTIAL/PLATELET
BASO%: 0.3 % (ref 0.0–2.0)
BASOS ABS: 0 10*3/uL (ref 0.0–0.1)
EOS ABS: 0.1 10*3/uL (ref 0.0–0.5)
EOS%: 1.1 % (ref 0.0–7.0)
HEMATOCRIT: 37 % (ref 34.8–46.6)
HEMOGLOBIN: 12.7 g/dL (ref 11.6–15.9)
LYMPH#: 2.8 10*3/uL (ref 0.9–3.3)
LYMPH%: 39.3 % (ref 14.0–49.7)
MCH: 31.4 pg (ref 25.1–34.0)
MCHC: 34.4 g/dL (ref 31.5–36.0)
MCV: 91.3 fL (ref 79.5–101.0)
MONO#: 0.5 10*3/uL (ref 0.1–0.9)
MONO%: 7.5 % (ref 0.0–14.0)
NEUT%: 51.8 % (ref 38.4–76.8)
NEUTROS ABS: 3.7 10*3/uL (ref 1.5–6.5)
Platelets: 312 10*3/uL (ref 145–400)
RBC: 4.05 10*6/uL (ref 3.70–5.45)
RDW: 13.2 % (ref 11.2–14.5)
WBC: 7.2 10*3/uL (ref 3.9–10.3)

## 2013-04-14 NOTE — Progress Notes (Signed)
ID: Lauren Fuller   DOB: May 15, 1955  MR#: [id number]  NKN#:[id number]   Chief complaint: Swelling and soreness in the right axilla  HISTORY OF PRESENT ILLNESS: As per previously documented note: Lauren Fuller had a mammogram in December 2004 at [facility] which showed some suspicious changes leading to further views and an ultrasound guided biopsy which, however, only showed benign tissue with fibrosis, no evidence of malignancy ([id number]).  She then had a mammogram in April 2006 which showed no focal area of concern for carcinoma.    More recently however, right mammogram and ultrasound obtained on 09-29-05 showed architectural distortion with microcalcifications in the right breast suspicious for carcinoma.  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    With this information, the patient was referred to Dr. Margot Fuller and on 10-24-05, bilateral breast MRI's were obtained which showed a solitary lesion in the upper outer quadrant of the right breast measuring up to 3.4 cm.    With this information, the patient proceeded to definitive local surgery with right lumpectomy and sentinel lymph node biopsy on 10-31-05.  The final pathology [id number]) showed an invasive ductal carcinoma which was at least 4.8 cm., with some lobular features but positive for E-cadherin, and with some adenoid cystic areas.  Overall the tumor was a grade 1, but there was evidence of lymphovascular invasion and the inferior margin was positive. Her subsequent history is as detailed below.  INTERVAL HISTORY: Lauren Fuller is here for followup visit for her breast cancer today. She does complain of right axillary pain and swelling since December 2014. She says that she fell in the month of November and at the time she fell ,she was carrying  her granddaughter in her right arm who was approximately 23 pounds. She denies any loss of weight and decrease in appetite. She does not complain of any fever chills, shortness of breath, chest pain, palpitations, headaches, blurred vision, blood in the stool and blood in the urine.   REVIEW OF SYSTEMS: A detailed review of systems was otherwise stable. Pertinent symptoms were mentioned in interval history  PAST MEDICAL HISTORY: Past Medical History  Diagnosis Date  . ALLERGIC RHINITIS 10/27/2006  . BREAST CANCER, HX OF 10/27/2006  . GERD 10/27/2006  . HYPERLIPIDEMIA 10/27/2006  . HYPERTENSION 10/27/2006  . PULMONARY EMBOLISM, HX OF 10/27/2006  . RENAL INSUFFICIENCY 08/01/2009  . Migraines   . Anemia   . Clotting disorder     PAST SURGICAL HISTORY: Past Surgical History  Procedure Laterality Date  . Abdominal hysterectomy    . Cholecystectomy    . Breast surgery      mastectomy  . Tubal ligation    . Cesarean section    . Nissen fundoplication    . Knee surgery      left  . Vocal cord injection    Significant for psoriasis, history of DVT with pulmonary embolism in November 2002 involving the right lower lobe, a history of hypercholesterolemia, a history of GERD which was treated with a fundal wrap which means she is physically unable to vomit.  She is status post hysterectomy with right salpingo-oophorectomy, status post cholecystectomy, status post tubal ligation, status post C-section and status post cholecystectomy.  FAMILY HISTORY Family History  Problem Relation Age of Onset  . Heart disease Mother   .  Heart disease Father   . Cancer Maternal Uncle     leukemia  . Cancer Sister     lung  The patient's father is alive at age 81.  The patient's mother is alive at age 5.  The patient has one brother and one sister.  There is no history in the immediate family of breast or ovarian cancer. One cousin was diagnosed with breast cancer at age 49.    GYNECOLOGIC HISTORY: She is  GX, P2.  Last menstrual period was 2002 when the patient had a hysterectomy, but she has one ovary in place   SOCIAL HISTORY: Lauren Fuller taught second grade.  Her husband, Lauren Fuller,  is a Conservator, museum/gallery for [employer].  Their daughter, Lauren Fuller, is studying business at [employer].  They have a son, Lauren Fuller,  who is mentally retarded with a seizure disorder.  The patient is a member of the [employer].     ADVANCED DIRECTIVES: In place  HEALTH MAINTENANCE: History  Substance Use Topics  . Smoking status: Never Smoker   . Smokeless tobacco: Never Used  . Alcohol Use: No     Colonoscopy:  PAP:  Bone density:  Lipid panel:  Allergies  Allergen Reactions  . Aspirin-Acetaminophen-Caffeine     excedrin REACTION: makes me really nervous inside  . Codeine     REACTION: itching  . Hydromorphone Hcl     REACTION: not effective  . Pentazocine Lactate     REACTION: hallucinations  . Pentazocine Lactate     Makes pt feel drunk    Current Outpatient Prescriptions  Medication Sig Dispense Refill  . ascorbic acid (VITAMIN C) 1000 MG tablet 1,000 mg.      . atorvastatin (LIPITOR) 40 MG tablet TAKE 1 TABLET DAILY  90 tablet  3  . Calcium Carbonate-Vitamin D (CALCIUM 600-D) 600-400 MG-UNIT per tablet 1 tablet.      . Cholecalciferol (VITAMIN D) 1000 UNITS capsule Take 5,000 Units by mouth daily.      . clobetasol ointment (TEMOVATE) 0.05 % Apply topically 2 (two) times daily.      . Flaxseed, Linseed, (FLAXSEED OIL) 1000 MG CAPS Take by mouth daily.      . Glucosamine-Chondroit-Vit C-Mn (GLUCOSAMINE 1500 COMPLEX) CAPS Take by mouth daily.      Marland Kitchen loratadine-pseudoephedrine (CLARITIN-D 12 HOUR) 5-120 MG per tablet 1 tablet.      . meclizine (ANTIVERT) 25 MG tablet 25 mg.      . meloxicam (MOBIC) 7.5 MG tablet Take 7.5 mg by mouth daily.        . methocarbamol (ROBAXIN) 500 MG tablet Take 500 mg by mouth 4 (four) times daily as needed.        . Multiple Vitamin (MULTIVITAMIN) capsule Take 1 capsule by  mouth daily.      . naratriptan (AMERGE) 2.5 MG tablet Take 2.5 mg by mouth as needed. Take one (1) tablet at onset of headache; if returns or does not resolve, may repeat after 4 hours; do not exceed five (5) mg in 24 hours.      . Omega-3 Fatty Acids (FISH OIL) 1000 MG CAPS 1 capsule.      Marland Kitchen omeprazole-sodium bicarbonate (ZEGERID) 40-1100 MG per capsule Take 1 capsule by mouth 2 (two) times daily.        . Polyethylene Glycol 3350 (MIRALAX PO) Take by mouth as needed.      . temazepam (RESTORIL) 22.5 MG capsule Take 1 capsule (22.5 mg total) by mouth at bedtime  as needed for sleep.  30 capsule  5  . triamcinolone (KENALOG) 0.1 % paste Place onto teeth 2 (two) times daily.      Marland Kitchen venlafaxine (EFFEXOR-XR) 150 MG 24 hr capsule Take 75 mg by mouth 3 (three) times daily.       Marland Kitchen zonisamide (ZONEGRAN) 100 MG capsule Take 100 mg by mouth 4 (four) times daily as needed.      . donepezil (ARICEPT) 5 MG tablet TAKE ONE TABLET BY MOUTH ONCE DAILY  90 tablet  0  . fenofibrate (TRICOR) 145 MG tablet 145 mg.      . Omeprazole-Sodium Bicarbonate (ZEGERID) 20-1680 MG PACK Take by mouth 2 (two) times daily.      Marland Kitchen oxyCODONE-acetaminophen (PERCOCET/ROXICET) 5-325 MG per tablet 1 tablet.       No current facility-administered medications for this visit.    OBJECTIVE: Middle-aged white woman in no acute distress Filed Vitals:   04/14/13 1018  BP: 130/76  Pulse: 93  Temp: 98.3 F (36.8 C)  Resp: 18     Body mass index is 28.1 kg/(m^2).    ECOG FS: 2   HEENT- PERRLA, Sclerae anicteric, conjunctiva no pallor  Oropharynx clear No peripheral adenopathy Lungs no rales or rhonchi Heart regular rate and rhythm Abd benign MSK no focal spinal tenderness, no peripheral edema Neuro: nonfocal  Breasts: She is status post left mastectomy, followed by bilateral reconstructive surgeries . No masses felt  Right axillary region: Tenderness appreciated in the axilla along with right axillary lateral wall swelling  which is tender  LAB RESULTS: Lab Results  Component Value Date   WBC 7.2 04/14/2013   NEUTROABS 3.7 04/14/2013   HGB 12.7 04/14/2013   HCT 37.0 04/14/2013   MCV 91.3 04/14/2013   PLT 312 04/14/2013      Chemistry      Component Value Date/Time   NA 141 04/14/2013 0951   NA 140 04/21/2011 1042   K 4.0 04/14/2013 0951   K 4.1 04/21/2011 1042   CL 106 04/21/2011 1042   CO2 28 04/14/2013 0951   CO2 24 04/21/2011 1042   BUN 23.6 04/14/2013 0951   BUN 19 04/21/2011 1042   CREATININE 0.8 04/14/2013 0951   CREATININE 0.80 04/21/2011 1042      Component Value Date/Time   CALCIUM 9.5 04/14/2013 0951   CALCIUM 8.8 04/21/2011 1042   ALKPHOS 60 04/14/2013 0951   ALKPHOS 80 04/21/2011 1042   AST 18 04/14/2013 0951   AST 18 04/21/2011 1042   ALT 24 04/14/2013 0951   ALT 27 04/21/2011 1042   BILITOT 0.34 04/14/2013 0951   BILITOT 0.3 04/21/2011 1042       Lab Results  Component Value Date   LABCA2 11 01/15/2009    No components found with this basename: [id number]    No results found for this basename: INR,  in the last 168 hours  Urinalysis    Component Value Date/Time   COLORURINE YELLOW 12/17/2006 [place] 12/17/2006 1342   LABSPEC 1.014 12/17/2006 1342   PHURINE 7.5 12/17/2006 1342   GLUCOSEU NEGATIVE 12/17/2006 [place] 12/17/2006 [place] 12/17/2006 1342   KETONESUR NEGATIVE 12/17/2006 1342   PROTEINUR NEGATIVE 12/17/2006 1342   UROBILINOGEN 0.2 12/17/2006 1342   NITRITE NEGATIVE 12/17/2006 1342   LEUKOCYTESUR NEGATIVE MICROSCOPIC NOT DONE ON URINES WITH NEGATIVE PROTEIN, BLOOD, LEUKOCYTES, NITRITE, OR GLUCOSE <1000 mg/dL. 12/17/2006 1342    STUDIES: No new results  found.  ASSESSMENT: 58 year old [place] woman  (1) status post right lumpectomy and sentinel lymph node biopsy August 2007 for a clinically 4.8-cm (cT2 cN0 or stage cIIA) adenoid cystic/ invasive ductal carcinoma  (2) treated adjuvantly with cyclophosphamide and doxorubicin x3 followed by  cyclophosphamide and docetaxel x1  (3) followed by right simple mastectomy with sentinel lymph node sampling (July 2008) with an 8-mm area of residual disease, 0/2 sentinel lymph nodes involved, grade 1, estrogen and progesterone receptor 83% and 94% respectively, HERceptest negative with a low proliferation fraction.  (4) She underwent left simple mastectomy with bilateral reconstruction in October 2008 and had benign pathology at that time.  (5) She  completed 5 years of adjuvant anastrozole  completed in 2013  (6) bilateral digital diagnostic mammograms with CAD and also ultrasound of right axilla performed on 03/29/2013 revealed no sonographic or mammographic evidence of suspicious abnormalities noted (7) She is s/p hysterectomy and BSO.  (8) She has a remote history of DVT. 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 I have discussed in detail mammogram and sonogram report with Lauren Fuller and her husband. 2. We'll arrange for  CT of the neck and chest including right axillary area in view of her right axillary tenderness and swelling to rule out any cancer recurrence. 3. I have reviewed her CBC and CMP labs today and those were stable 4. I have encouraged the patient to perform monthly self breast /chest wall/axillary examination 5. next annual mammogram in January 2015 6. Follow up with primary care physician as scheduled  7. Next followup visit in one year with CBC and differential and CMP  Lauren Fuller is in agreement with the current plan of care and she knows to call our office with  any questions or concerns. Our office will communicate with the patient if any abnormal results of the CAT scan.  Total time spent: 30 minutes   Lauren Fuller, M.D. Medical oncology     04/14/2013

## 2013-04-15 ENCOUNTER — Ambulatory Visit (HOSPITAL_COMMUNITY)
Admission: RE | Admit: 2013-04-15 | Discharge: 2013-04-15 | Disposition: A | Discharge status: Home / Self Care | Payer: Medicare Other | Source: Ambulatory Visit | Attending: Hematology and Oncology | Admitting: Hematology and Oncology

## 2013-04-15 ENCOUNTER — Encounter (HOSPITAL_COMMUNITY): Payer: Self-pay

## 2013-04-15 DIAGNOSIS — C50919 Malignant neoplasm of unspecified site of unspecified female breast: Secondary | ICD-10-CM | POA: Insufficient documentation

## 2013-04-15 MED ORDER — IOHEXOL 300 MG/ML  SOLN
100.0000 mL | Freq: Once | INTRAMUSCULAR | Status: AC | PRN
  Administered 2013-04-15: 100 mL via INTRAVENOUS

## 2013-04-21 ENCOUNTER — Telehealth: Payer: Self-pay | Admitting: *Deleted

## 2013-04-21 NOTE — Telephone Encounter (Signed)
Pt told of negative results of CT scan of the chest and neck with contrast.

## 2013-05-29 ENCOUNTER — Other Ambulatory Visit: Payer: Self-pay | Admitting: Neurology

## 2013-05-30 NOTE — Telephone Encounter (Signed)
Dr Dohmeier is out of the office, forwarding request to [facility].  

## 2013-05-31 NOTE — Telephone Encounter (Signed)
Rx has been faxed.

## 2013-07-01 ENCOUNTER — Other Ambulatory Visit: Payer: Self-pay | Admitting: Neurology

## 2013-07-01 MED ORDER — TEMAZEPAM 22.5 MG PO CAPS: 22.5000 mg | ORAL_CAPSULE | Freq: Every evening | ORAL | Status: DC | PRN

## 2013-07-01 NOTE — Telephone Encounter (Signed)
Rx signed and faxed.

## 2013-07-01 NOTE — Telephone Encounter (Signed)
Pt called to get an apt and also get a refill on her temazepam (RESTORIL) 22.5 MG capsule. I did schedule pt with NP/CM due to Dr. Brett Fairy is scheduled out through Nov. Please call pt when this has been called in. Thanks

## 2013-08-12 ENCOUNTER — Other Ambulatory Visit: Payer: Self-pay | Admitting: Neurology

## 2013-08-14 ENCOUNTER — Other Ambulatory Visit: Payer: Self-pay | Admitting: Neurology

## 2013-09-09 ENCOUNTER — Encounter: Payer: Self-pay | Admitting: *Deleted

## 2013-09-12 ENCOUNTER — Ambulatory Visit: Payer: BC Managed Care – PPO | Admitting: Adult Health

## 2013-09-12 ENCOUNTER — Ambulatory Visit: Payer: BC Managed Care – PPO | Admitting: Nurse Practitioner

## 2013-09-15 ENCOUNTER — Encounter: Payer: Self-pay | Admitting: *Deleted

## 2013-09-22 ENCOUNTER — Encounter (INDEPENDENT_AMBULATORY_CARE_PROVIDER_SITE_OTHER): Payer: Self-pay

## 2013-09-22 ENCOUNTER — Encounter: Payer: Self-pay | Admitting: Neurology

## 2013-09-22 ENCOUNTER — Ambulatory Visit (INDEPENDENT_AMBULATORY_CARE_PROVIDER_SITE_OTHER): Payer: Medicare Other | Admitting: Neurology

## 2013-09-22 VITALS — BP 131/79 | HR 99 | Resp 16 | Ht 64.0 in | Wt 165.0 lb

## 2013-09-22 DIAGNOSIS — F5102 Adjustment insomnia: Secondary | ICD-10-CM

## 2013-09-22 DIAGNOSIS — R413 Other amnesia: Secondary | ICD-10-CM

## 2013-09-22 DIAGNOSIS — R0683 Snoring: Secondary | ICD-10-CM | POA: Insufficient documentation

## 2013-09-22 DIAGNOSIS — C801 Malignant (primary) neoplasm, unspecified: Secondary | ICD-10-CM

## 2013-09-22 DIAGNOSIS — R0609 Other forms of dyspnea: Secondary | ICD-10-CM

## 2013-09-22 DIAGNOSIS — G3184 Mild cognitive impairment, so stated: Secondary | ICD-10-CM | POA: Insufficient documentation

## 2013-09-22 DIAGNOSIS — L509 Urticaria, unspecified: Secondary | ICD-10-CM | POA: Insufficient documentation

## 2013-09-22 DIAGNOSIS — F0280 Dementia in other diseases classified elsewhere without behavioral disturbance: Secondary | ICD-10-CM

## 2013-09-22 DIAGNOSIS — R0989 Other specified symptoms and signs involving the circulatory and respiratory systems: Secondary | ICD-10-CM

## 2013-09-22 MED ORDER — MIRTAZAPINE 30 MG PO TBDP: 30.0000 mg | ORAL_TABLET | Freq: Every day | ORAL | Status: DC

## 2013-09-22 MED ORDER — TEMAZEPAM 22.5 MG PO CAPS: 22.5000 mg | ORAL_CAPSULE | Freq: Every evening | ORAL | Status: DC | PRN

## 2013-09-22 NOTE — Patient Instructions (Signed)
Insomnia Insomnia is frequent trouble falling and/or staying asleep. Insomnia can be a long term problem or a short term problem. Both are common. Insomnia can be a short term problem when the wakefulness is related to a certain stress or worry. Long term insomnia is often related to ongoing stress during waking hours and/or poor sleeping habits. Overtime, sleep deprivation itself can make the problem worse. Every little thing feels more severe because you are overtired and your ability to cope is decreased. CAUSES   Stress, anxiety, and depression.  Poor sleeping habits.  Distractions such as TV in the bedroom.  Naps close to bedtime.  Engaging in emotionally charged conversations before bed.  Technical reading before sleep.  Alcohol and other sedatives. They may make the problem worse. They can hurt normal sleep patterns and normal dream activity.  Stimulants such as caffeine for several hours prior to bedtime.  Pain syndromes and shortness of breath can cause insomnia.  Exercise late at night.  Changing time zones may cause sleeping problems (jet lag). It is sometimes helpful to have someone observe your sleeping patterns. They should look for periods of not breathing during the night (sleep apnea). They should also look to see how long those periods last. If you live alone or observers are uncertain, you can also be observed at a sleep clinic where your sleep patterns will be professionally monitored. Sleep apnea requires a checkup and treatment. Give your caregivers your medical history. Give your caregivers observations your family has made about your sleep.  SYMPTOMS   Not feeling rested in the morning.  Anxiety and restlessness at bedtime.  Difficulty falling and staying asleep. TREATMENT   Your caregiver may prescribe treatment for an underlying medical disorders. Your caregiver can give advice or help if you are using alcohol or other drugs for self-medication. Treatment  of underlying problems will usually eliminate insomnia problems.  Medications can be prescribed for short time use. They are generally not recommended for lengthy use.  Over-the-counter sleep medicines are not recommended for lengthy use. They can be habit forming.  You can promote easier sleeping by making lifestyle changes such as:  Using relaxation techniques that help with breathing and reduce muscle tension.  Exercising earlier in the day.  Changing your diet and the time of your last meal. No night time snacks.  Establish a regular time to go to bed.  Counseling can help with stressful problems and worry.  Soothing music and white noise may be helpful if there are background noises you cannot remove.  Stop tedious detailed work at least one hour before bedtime. HOME CARE INSTRUCTIONS   Keep a diary. Inform your caregiver about your progress. This includes any medication side effects. See your caregiver regularly. Take note of:  Times when you are asleep.  Times when you are awake during the night.  The quality of your sleep.  How you feel the next day. This information will help your caregiver care for you.  Get out of bed if you are still awake after 15 minutes. Read or do some quiet activity. Keep the lights down. Wait until you feel sleepy and go back to bed.  Keep regular sleeping and waking hours. Avoid naps.  Exercise regularly.  Avoid distractions at bedtime. Distractions include watching television or engaging in any intense or detailed activity like attempting to balance the household checkbook.  Develop a bedtime ritual. Keep a familiar routine of bathing, brushing your teeth, climbing into bed at the same   time each night, listening to soothing music. Routines increase the success of falling to sleep faster.  Use relaxation techniques. This can be using breathing and muscle tension release routines. It can also include visualizing peaceful scenes. You can  also help control troubling or intruding thoughts by keeping your mind occupied with boring or repetitive thoughts like the old concept of counting sheep. You can make it more creative like imagining planting one beautiful flower after another in your backyard garden.  During your day, work to eliminate stress. When this is not possible use some of the previous suggestions to help reduce the anxiety that accompanies stressful situations. MAKE SURE YOU:   Understand these instructions.  Will watch your condition.  Will get help right away if you are not doing well or get worse. Document Released: 02/29/2000 Document Revised: 05/26/2011 Document Reviewed: 03/31/2007 Exit[employer] Patient Information 2015 [employer]. This information is not intended to replace advice given to you by your health care provider. Make sure you discuss any questions you have with your health care provider.  

## 2013-09-22 NOTE — Progress Notes (Signed)
[facility]  Provider:  Larey Seat, M D  Referring Provider: Chesley Noon, MD Primary Care Physician:  Chesley Noon, MD  Chief Complaint  Patient presents with  . Follow-up    Room 10  . Cognitive deficits    MOCA 29/30    HPI:  Lauren Fuller is a 58 y.o. female  Is seen here as a referral from Dr. Melford Aase for  A PROLONGED REVISIT. THIS PATIENT HAS BEEN FOLLOWED AT [facility]  FOR MEMORY LOSS, FOLLOWING THE DIAGNOSIS AND TREATMENT OF BREAST CANCER .    Has been an established patient of our practice. In 2007 she was diagnosed with breast cancer. This diagnosis was followed by radiation and chemotherapy and soon afterwards,  following double mastectomies , she developed cognitive impairment.  She had regular and repeated memory tests here at the office.  The patient had seen Dr. Valentina Shaggy , neuropsychologist, who found that the patient would be long-term disabled due to her cognitive impairment easy distractibility and tangential thinking.  Also multitasking difficulties remained evident.  Interesting is that the patient had examinations liver not showing that severe of an impairment,  but she had frequent memory problems,  would forget which road exit she was supposed to take .  Forgot passwords for the  computer and her family had noticed significant executive and attention problems.  Trial of Adderall have not made that much difference for her . Sleep testing showed very mild apnea.    Migraines became almost disabling for her at the same time. The use of zonisamide managed to treat her headaches. She did develop some insomnia and responded to Botox treatments at [facility] .  She did not take frequent naps.  From 2012 on and finally in March 2014 some prolonged relief from migraines with Botox treatments at [facility].  The patient used to work as an Interior and spatial designer.   Today's Mini-Mental status exam revealed 29 of 30 points only one of the  5 recall words was missed. This is the same as she scored in November 2013 March 2013., In September 2012. Clock drawing and animal fluency testing were also unremarkable.  I will repeat today a MOCA tests for more detail.   Review of Systems: Out of a complete 14 system review, the patient complains of only the following symptoms, and all other reviewed systems are negative.  Inattentiveness, easy distractibility  will not necessarily  be reflected in this test.  She still has insomnia, and she is using temazepam.  Migraine is controlled.        History   Social History  . Marital Status: Married    Spouse Name: Tommy    Number of Children: 2  . Years of Education: BS   Occupational History  .     Social History Main Topics  . Smoking status: Never Smoker   . Smokeless tobacco: Never Used  . Alcohol Use: No  . Drug Use: No  . Sexual Activity: Not on file   Other Topics Concern  . 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   Patient has two children.   Patient is disabled.   Patient drinks caffeine three to four times per week.   Patient is right-handed.   Patient has a Financial planner.    Family History  Problem Relation Age of Onset  . Heart disease Mother   .  Heart disease Father   . Cancer Maternal Uncle     leukemia  . Cancer Sister     lung    Past Medical History  Diagnosis Date  . ALLERGIC RHINITIS 10/27/2006  . BREAST CANCER, HX OF 10/27/2006  . GERD 10/27/2006  . HYPERLIPIDEMIA 10/27/2006  . HYPERTENSION 10/27/2006  . PULMONARY EMBOLISM, HX OF 10/27/2006  . RENAL INSUFFICIENCY 08/01/2009  . Migraines   . Anemia   . Clotting disorder   . Organic brain syndrome     Past Surgical History  Procedure Laterality Date  . Abdominal hysterectomy    . Cholecystectomy    . Breast surgery      mastectomy  . Tubal ligation    . Cesarean section    . Nissen fundoplication    .  Knee surgery      left  . Vocal cord injection      Current Outpatient Prescriptions  Medication Sig Dispense Refill  . ascorbic acid (VITAMIN C) 1000 MG tablet 1,000 mg.      . atorvastatin (LIPITOR) 40 MG tablet TAKE 1 TABLET DAILY  90 tablet  3  . clobetasol ointment (TEMOVATE) 0.05 % Apply topically 2 (two) times daily.      Marland Kitchen donepezil (ARICEPT) 5 MG tablet TAKE ONE TABLET BY MOUTH ONCE DAILY  90 tablet  0  . donepezil (ARICEPT) 5 MG tablet Take 1 tablet (5 mg total) by mouth daily.  90 tablet  0  . donepezil (ARICEPT) 5 MG tablet Take 1 tablet (5 mg total) by mouth daily.  90 tablet  0  . fenofibrate (TRICOR) 145 MG tablet 145 mg.      . Flaxseed, Linseed, (FLAXSEED OIL) 1000 MG CAPS Take by mouth daily.      . Folic Acid-Vit P3-IRJ J88 (B COMPLEX-FOLIC ACID) 416-6-063 MCG-MG-MCG TABS Take by mouth.      . Glucosamine-Chondroit-Vit C-Mn (GLUCOSAMINE 1500 COMPLEX) CAPS Take by mouth daily.      . meloxicam (MOBIC) 7.5 MG tablet Take 7.5 mg by mouth daily.        . methocarbamol (ROBAXIN) 500 MG tablet Take 500 mg by mouth every 6 (six) hours as needed for muscle spasms (Every 6-8 hours as needed).       . Multiple Vitamin (MULTIVITAMIN) capsule Take 1 capsule by mouth daily.      . naratriptan (AMERGE) 2.5 MG tablet Take 2.5 mg by mouth as needed. Take one (1) tablet at onset of headache; if returns or does not resolve, may repeat after 4 hours; do not exceed five (5) mg in 24 hours.      . Omega-3 Fatty Acids (FISH OIL) 1000 MG CAPS 1 capsule.      Marland Kitchen omeprazole-sodium bicarbonate (ZEGERID) 40-1100 MG per capsule Take 1 capsule by mouth 2 (two) times daily.        Marland Kitchen oxyCODONE-acetaminophen (PERCOCET/ROXICET) 5-325 MG per tablet 1 tablet.      . Prenatal w/o A Vit-Fe Fum-FA (BP MULTINATAL PLUS) 30-1 MG TABS Take by mouth.      . temazepam (RESTORIL) 22.5 MG capsule Take 1 capsule (22.5 mg total) by mouth at bedtime as needed for sleep.  30 capsule  3  . triamcinolone (KENALOG) 0.1 %  paste Place onto teeth 2 (two) times daily.      Marland Kitchen venlafaxine (EFFEXOR) 75 MG tablet Take 75 mg by mouth daily. Take 3 tablets by mouth daily      . VITAMIN D, CHOLECALCIFEROL, PO Take  by mouth.      . zonisamide (ZONEGRAN) 100 MG capsule Take 100 mg by mouth 4 (four) times daily as needed.      . Calcium Carbonate-Vitamin D (CALCIUM 600-D) 600-400 MG-UNIT per tablet 1 tablet.       No current facility-administered medications for this visit.    Allergies as of 09/22/2013 - Review Complete 09/22/2013  Allergen Reaction Noted  . Aspirin-acetaminophen-caffeine Anaphylaxis 11/02/2009  . Butalbital-aspirin-caffeine  09/09/2013  . Butalbital-aspirin-caffeine  09/22/2013  . Codeine Itching 11/02/2009  . Cyclosporine Other (See Comments) and Itching 09/09/2013  . Fiorinal p-f w/codeine  [butalbital-asa-caff-codeine] Other (See Comments) 09/22/2013  . Hydromorphone hcl  11/02/2009  . Pentazocine lactate  11/02/2009  . Pentazocine lactate  04/18/2011  . Pentazocine Anxiety 09/09/2013    Vitals: BP 131/79  Pulse 99  Resp 16  Ht 5\' 4"  (1.626 m)  Wt 165 lb (74.844 kg)  BMI 28.31 kg/m2 Last Weight:  Wt Readings from Last 1 Encounters:  09/22/13 165 lb (74.844 kg)   Last Height:   Ht Readings from Last 1 Encounters:  09/22/13 5\' 4"  (1.626 m)    Physical exam:  General: The patient is awake, alert and appears not in acute distress. The patient is well groomed. Head: Normocephalic, atraumatic. Neck is supple. Mallampati 3 , neck circumference: 15.5 inches.  Cardiovascular:  Regular rate and rhythm , without  murmurs or carotid bruit, and without distended neck veins. Respiratory: Lungs are clear to auscultation. Skin:  Without evidence of edema, or rash Trunk: BMI is elevated and patient  has normal posture.  Neurologic exam : The patient is awake and alert, oriented to place and time.  Memory subjective described as impaired, MOCA was 26-30 and MMSE was 30-30 . Marland Kitchen There is a normal  attention span & concentration ability.  Speech is fluent without   dysarthria, dysphonia or aphasia. Mood and affect are appropriate.  Cranial nerves: Pupils are equal and briskly reactive to light.Extraocular movements  in vertical and horizontal planes intact and without nystagmus. Visual fields by finger perimetry are intact. Hearing to finger rub intact.  Facial sensation intact to fine touch. Facial motor strength is symmetric and tongue and uvula move midline.  Motor exam:  Symmetric normal strength in all extremities.  Sensory:  Fine touch, pinprick and vibration were tested in all extremities. Proprioception is  normal.  Coordination: Rapid alternating movements in the fingers/hands is tested and normal.  Finger-to-nose maneuver tested and normal without evidence of ataxia, dysmetria or tremor.  Gait and station: Patient walks without assistive device and is able and assisted stool climb up to the exam table. Strength within normal limits. Stance is stable and normal. Steps are unfragmented. Romberg testing is normal.  Deep tendon reflexes: in the  upper and lower extremities are symmetric and intact. Babinski maneuver response is downgoing.   Assessment:  After physical and neurologic examination, review of laboratory studies, imaging, neurophysiology testing and pre-existing records, assessment is   Mild cognitive impairment seems to have improved, yet she has daily examples of executive memory dysfunction.   Migraine , well controlled.  Insomnia, poorly controlled.    Plan:  Treatment plan and additional workup :  Trial of

## 2013-10-11 ENCOUNTER — Telehealth: Payer: Self-pay | Admitting: Neurology

## 2013-10-11 NOTE — Telephone Encounter (Signed)
Per Dr. Brett Fairy it is ok for the patient to take the Remeron, watch it for a couple of days and if she does not have any side effects then she can continue taking it.  Left vm to call the office back.

## 2013-10-12 NOTE — Telephone Encounter (Signed)
Patient was advised that it is ok to start taking the Remeron and to watch it for a couple of days if not having any side effects then it is ok to continue taking.  Patient verbalized understanding.

## 2014-01-30 ENCOUNTER — Other Ambulatory Visit: Payer: Self-pay | Admitting: Neurology

## 2014-03-15 ENCOUNTER — Telehealth: Payer: Self-pay | Admitting: Oncology

## 2014-03-15 NOTE — Telephone Encounter (Signed)
Lvm advising appt chg from 2/1 (md pal) to 2/11 @ 12.45. Also mailed appt calendar.

## 2014-03-28 ENCOUNTER — Encounter: Payer: Self-pay | Admitting: Nurse Practitioner

## 2014-03-28 ENCOUNTER — Ambulatory Visit (INDEPENDENT_AMBULATORY_CARE_PROVIDER_SITE_OTHER): Payer: 59 | Admitting: Nurse Practitioner

## 2014-03-28 VITALS — BP 142/80 | HR 93 | Temp 98.2°F | Ht 64.0 in | Wt 174.0 lb

## 2014-03-28 DIAGNOSIS — G3184 Mild cognitive impairment, so stated: Secondary | ICD-10-CM

## 2014-03-28 MED ORDER — MIRTAZAPINE 30 MG PO TBDP: 30.0000 mg | ORAL_TABLET | Freq: Every day | ORAL | Status: DC

## 2014-03-28 NOTE — Progress Notes (Signed)
[facility]  PATIENT: Lauren Fuller DOB: [birth year]   REASON FOR VISIT: for mild cognitive impairment HISTORY OF PRESENT ILLNESS: UPDATE: 03/28/14 Lauren Fuller, 59 year old female returns for follow up. She has a history of mild cognitive impairment. Today's Mini-Mental status exam 30/30.  She was last seen by Dr. Brett Fairy 09/22/2013. She is currently on low dose Aricept 5 mg daily. She is also on Remeron . She is still forgetful but is using lists to remember things. She returns for reevaluation  HISTORY:Lauren Fuller 59 year old returns for followup. Has been an established patient of our practice. In 2007 she was diagnosed with breast cancer. This diagnosis was followed by radiation and chemotherapy and soon afterwards, following double mastectomies , she developed cognitive impairment. She had regular and repeated memory tests here at the office.  The patient had seen Dr. Valentina Shaggy , neuropsychologist, who found that the patient would be long-term disabled due to her cognitive impairment easy distractibility and tangential thinking. Also multitasking difficulties remained evident. Forgot passwords for the computer and her family had noticed significant executive and attention problems. Trial of Adderall have not made that much difference for her .Sleep testing showed very mild apnea.  Migraines became almost disabling for her at the same time. The use of zonisamide managed to treat her headaches. She did develop some insomnia and responded to Botox treatments at [facility] . She did not take frequent naps. From 2012 on and finally in March 2014 some prolonged relief from migraines with Botox treatments at [facility]. The patient used to work as an Interior and spatial designer.   Today's Mini-Mental status exam revealed 29 of 30 points only one of the 5 recall words was missed. This is the same as she scored in November 2013 March 2013., In September 2012. Clock  drawing and animal fluency testing were also unremarkable.  REVIEW OF SYSTEMS: Full 14 system review of systems performed and notable only for those listed, all others are neg:  Constitutional: Fatigue  Cardiovascular: N/A  Ear/Nose/Throat: N/A  Skin: N/A  Eyes: N/A  Respiratory: N/A  Gastroitestinal: N/A  Hematology/Lymphatic: N/A  Endocrine: N/A Musculoskeletal: Back pain Allergy/Immunology: N/A  Neurological: Memory loss Psychiatric: N/A Sleep : Insomnia   ALLERGIES: Allergies  Allergen Reactions  . Aspirin-Acetaminophen-Caffeine Anaphylaxis    Excedrin - jittery excedrin REACTION: makes me really nervous inside  . Butalbital-Aspirin-Caffeine     dizzy  . Butalbital-Aspirin-Caffeine     dizzy  . Codeine Itching    REACTION: itching  . Cyclosporine Other (See Comments) and Itching  . Fiorinal P-F W/Codeine  [Butalbital-Asa-Caff-Codeine] Other (See Comments)    FLORINAL 3.- severe sedation  . Hydromorphone Hcl     REACTION: not effective  . Pentazocine Lactate     REACTION: hallucinations  . Pentazocine Lactate     Makes pt feel drunk  . Pentazocine Anxiety    hallucinations    HOME MEDICATIONS: Outpatient Prescriptions Prior to Visit  Medication Sig Dispense Refill  . ascorbic acid (VITAMIN C) 1000 MG tablet 1,000 mg.    . atorvastatin (LIPITOR) 40 MG tablet TAKE 1 TABLET DAILY 90 tablet 3  . donepezil (ARICEPT) 5 MG tablet TAKE ONE TABLET BY MOUTH ONCE DAILY 90 tablet 0  . donepezil (ARICEPT) 5 MG tablet TAKE ONE TABLET BY MOUTH ONCE DAILY 90 tablet 2  . Flaxseed, Linseed, (FLAXSEED OIL) 1000 MG CAPS Take by mouth daily.    . Folic Acid-Vit Z6-XWR U04 (B COMPLEX-FOLIC  ACID) 500-5-200 MCG-MG-MCG TABS Take by mouth.    . Glucosamine-Chondroit-Vit C-Mn (GLUCOSAMINE 1500 COMPLEX) CAPS Take by mouth daily.    . meloxicam (MOBIC) 7.5 MG tablet Take 7.5 mg by mouth daily.      . methocarbamol (ROBAXIN) 500 MG tablet Take 500 mg by mouth every 6 (six) hours as needed  for muscle spasms (Every 6-8 hours as needed).     . mirtazapine (REMERON SOL-TAB) 30 MG disintegrating tablet Take 1 tablet (30 mg total) by mouth at bedtime. 30 tablet 5  . Multiple Vitamin (MULTIVITAMIN) capsule Take 1 capsule by mouth daily.    . naratriptan (AMERGE) 2.5 MG tablet Take 2.5 mg by mouth as needed. Take one (1) tablet at onset of headache; if returns or does not resolve, may repeat after 4 hours; do not exceed five (5) mg in 24 hours.    . Omega-3 Fatty Acids (FISH OIL) 1000 MG CAPS 1 capsule.    Marland Kitchen omeprazole-sodium bicarbonate (ZEGERID) 40-1100 MG per capsule Take 1 capsule by mouth 2 (two) times daily.      . Prenatal w/o A Vit-Fe Fum-FA (BP MULTINATAL PLUS) 30-1 MG TABS Take by mouth.    . temazepam (RESTORIL) 22.5 MG capsule Take 1 capsule (22.5 mg total) by mouth at bedtime as needed for sleep. 30 capsule 0  . venlafaxine (EFFEXOR) 75 MG tablet Take 75 mg by mouth daily. Take 3 tablets by mouth daily    . VITAMIN D, CHOLECALCIFEROL, PO Take by mouth.    . zonisamide (ZONEGRAN) 100 MG capsule Take 100 mg by mouth 4 (four) times daily as needed.    . Calcium Carbonate-Vitamin D (CALCIUM 600-D) 600-400 MG-UNIT per tablet 1 tablet.    . fenofibrate (TRICOR) 145 MG tablet 145 mg.     No facility-administered medications prior to visit.    PAST MEDICAL HISTORY: Past Medical History  Diagnosis Date  . ALLERGIC RHINITIS 10/27/2006  . BREAST CANCER, HX OF 10/27/2006  . GERD 10/27/2006  . HYPERLIPIDEMIA 10/27/2006  . HYPERTENSION 10/27/2006  . PULMONARY EMBOLISM, HX OF 10/27/2006  . RENAL INSUFFICIENCY 08/01/2009  . Migraines   . Anemia   . Clotting disorder   . Organic brain syndrome     PAST SURGICAL HISTORY: Past Surgical History  Procedure Laterality Date  . Abdominal hysterectomy    . Cholecystectomy    . Breast surgery      mastectomy  . Tubal ligation    . Cesarean section    . Nissen fundoplication    . Knee surgery      left  . Vocal cord injection       FAMILY HISTORY: Family History  Problem Relation Age of Onset  . Heart disease Mother   . Heart disease Father   . Cancer Maternal Uncle     leukemia  . Cancer Sister     lung    SOCIAL HISTORY: History   Social History  . Marital Status: Married    Spouse Name: Tommy    Number of Children: 2  . Years of Education: BS   Occupational History  .     Social History Main Topics  . Smoking status: Never Smoker   . Smokeless tobacco: Never Used  . Alcohol Use: No  . Drug Use: No  . Sexual Activity: Not on file   Other Topics Concern  . 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   Patient has two children.   Patient is disabled.   Patient drinks caffeine three to four times per week.   Patient is right-handed.   Patient has a Financial planner.     PHYSICAL EXAM  Filed Vitals:   03/28/14 1434  BP: 142/80  Pulse: 93  Temp: 98.2 F (36.8 C)  TempSrc: Oral  Height: '5\' 4"'  (1.626 m)  Weight: 174 lb (78.926 kg)   Body mass index is 29.85 kg/(m^2). General: The patient is awake, alert and appears not in acute distress. The patient is well groomed. Head: Normocephalic, atraumatic.  Neck is supple.   Cardiovascular: Regular rate and rhythm  Respiratory: Lungs are clear to auscultation. Skin: Without evidence of edema, or rash Neurologic exam : The patient is awake and alert, oriented to place and time. Memory subjective described as impaired,  MMSE was 30-30 . Marland Kitchen There is a normal attention span & concentration ability.  Speech is fluent without dysarthria, dysphonia or aphasia. Mood and affect are appropriate.  Cranial nerves: Pupils are equal and briskly reactive to light.Extraocular movements in vertical and horizontal planes intact and without nystagmus. Visual fields by finger perimetry are intact. Hearing to finger rub intact. Facial sensation intact to fine touch. Facial  motor strength is symmetric and tongue and uvula move midline. Motor exam: Symmetric normal strength in all extremities. No focal weakness Sensory: Fine touch, pinprick and vibration were tested in all extremities. Proprioception is normal. Coordination: Rapid alternating movements in the fingers/hands is tested and normal.Finger-to-nose maneuver tested and normal without evidence of ataxia, dysmetria or tremor. Gait and station:  Stance is stable and normal.Steps are unfragmented. Ambulated 35 feet in the hall without difficulty. No assistive device Romberg testing is normal. Deep tendon reflexes: in the upper and lower extremities are symmetric and intact. Babinski maneuver response is downgoing.   DIAGNOSTIC DATA (LABS, IMAGING, TESTING) - I reviewed patient records, labs, notes, testing and imaging myself where available.  Lab Results  Component Value Date   WBC 7.2 04/14/2013   HGB 12.7 04/14/2013   HCT 37.0 04/14/2013   MCV 91.3 04/14/2013   PLT 312 04/14/2013      Component Value Date/Time   NA 141 04/14/2013 0951   NA 140 04/21/2011 1042   K 4.0 04/14/2013 0951   K 4.1 04/21/2011 1042   CL 106 04/21/2011 1042   CO2 28 04/14/2013 0951   CO2 24 04/21/2011 1042   GLUCOSE 101 04/14/2013 0951   GLUCOSE 101* 04/21/2011 1042   BUN 23.6 04/14/2013 0951   BUN 19 04/21/2011 1042   CREATININE 0.8 04/14/2013 0951   CREATININE 0.80 04/21/2011 1042   CALCIUM 9.5 04/14/2013 0951   CALCIUM 8.8 04/21/2011 1042   PROT 6.7 04/14/2013 0951   PROT 6.0 04/21/2011 1042   ALBUMIN 4.0 04/14/2013 0951   ALBUMIN 3.9 04/21/2011 1042   AST 18 04/14/2013 0951   AST 18 04/21/2011 1042   ALT 24 04/14/2013 0951   ALT 27 04/21/2011 1042   ALKPHOS 60 04/14/2013 0951   ALKPHOS 80 04/21/2011 1042   BILITOT 0.34 04/14/2013 0951   BILITOT 0.3 04/21/2011 1042   GFRNONAA 77.42 08/01/2009 0845   GFRAA  12/23/2006 0505    >60        The eGFR has been calculated using the MDRD equation. This  calculation has not been validated in all clinical   Lab Results  Component Value Date   CHOL 168 02/20/2011   HDL  57.30 02/20/2011   LDLCALC 79 02/20/2011   LDLDIRECT 105.9 07/09/2006   TRIG 161.0* 02/20/2011   CHOLHDL 3 02/20/2011       ASSESSMENT AND PLAN  59 y.o. year old female  has a past medical history of mild cognitive impairment.  Memory score is stable Continue Aricept does not need refills Continue Remeron RX given to patient F/U 6 months Dennie Bible, [facility], [facility], APRN  [facility] [address] [place], [place] [address] [phone]

## 2014-03-28 NOTE — Patient Instructions (Signed)
Memory score is stable Continue Aricept does not need refills Continue Remeron RX given to patient F/U 6 months

## 2014-03-29 NOTE — Progress Notes (Signed)
I agree with the assessment and plan as directed by NP .The patient is known to me .   Vyla Pint, MD  

## 2014-04-17 ENCOUNTER — Ambulatory Visit: Payer: BC Managed Care – PPO | Admitting: Oncology

## 2014-04-17 ENCOUNTER — Other Ambulatory Visit: Payer: BC Managed Care – PPO

## 2014-04-17 ENCOUNTER — Telehealth: Payer: Self-pay | Admitting: Neurology

## 2014-04-18 ENCOUNTER — Telehealth: Payer: Self-pay | Admitting: Nurse Practitioner

## 2014-04-18 NOTE — Telephone Encounter (Signed)
pt left vm in re to appt 2/11 no details-cld & left pt a message adv to c/b-left pt time & date of appt

## 2014-04-19 ENCOUNTER — Telehealth: Payer: Self-pay | Admitting: *Deleted

## 2014-04-19 NOTE — Telephone Encounter (Signed)
[facility] sent to [place] and Dr Dohmeier to be completed 04-19-13.

## 2014-04-20 DIAGNOSIS — Z0289 Encounter for other administrative examinations: Secondary | ICD-10-CM

## 2014-04-24 ENCOUNTER — Telehealth: Payer: Self-pay

## 2014-04-24 NOTE — Telephone Encounter (Signed)
Returned call to pt re: appt.  Pt questions if this appt is needed as she is 5 years out.  Pt past due for mammogram and reports she is planning on doing that and a bone density.  Let pt know I would move out her 2/11 appt to early March to allow for both tests to be done, resulted and reviewed by Dr. Jana Hakim and she could speak with Dr. Jana Hakim at that time about the need to continue coming to the clinic.  Pt was agreeable with this and voiced understanding.  POF sent

## 2014-04-25 ENCOUNTER — Telehealth: Payer: Self-pay | Admitting: Oncology

## 2014-04-25 NOTE — Telephone Encounter (Signed)
per pof to sch pt appt-cld & left pt appt time & date °

## 2014-04-27 ENCOUNTER — Other Ambulatory Visit: Payer: Medicare Other

## 2014-04-27 ENCOUNTER — Ambulatory Visit: Payer: Self-pay | Admitting: Nurse Practitioner

## 2014-05-02 NOTE — Telephone Encounter (Signed)
How can I complete a form by 04-19-14 that wasn't forwarded to me before today, 05-02-14 ? I signed it.

## 2014-05-02 NOTE — Telephone Encounter (Signed)
Form to Dr. Edwena Felty desk (yellow folder).

## 2014-05-03 ENCOUNTER — Telehealth: Payer: Self-pay | Admitting: *Deleted

## 2014-05-03 NOTE — Telephone Encounter (Signed)
[employer] received,completed by Dr Brett Fairy and Lovey Newcomer at front desk for patient 05-03-14.

## 2014-05-17 ENCOUNTER — Telehealth: Payer: Self-pay | Admitting: Oncology

## 2014-05-17 NOTE — Telephone Encounter (Signed)
pt left vm to CX appt-adv pt to c/b to r/s appt

## 2014-05-19 ENCOUNTER — Other Ambulatory Visit: Payer: Medicare Other

## 2014-05-19 ENCOUNTER — Ambulatory Visit: Payer: Self-pay | Admitting: Oncology

## 2014-06-06 ENCOUNTER — Telehealth: Payer: Self-pay | Admitting: Oncology

## 2014-06-06 NOTE — Telephone Encounter (Signed)
pt cld left voicemail wanted to r/s appt-adv to call back to r/s

## 2014-06-19 ENCOUNTER — Encounter: Payer: Self-pay | Admitting: Oncology

## 2014-06-19 ENCOUNTER — Other Ambulatory Visit: Payer: Self-pay | Admitting: Neurology

## 2014-10-04 ENCOUNTER — Encounter: Payer: Self-pay | Admitting: Neurology

## 2014-10-04 ENCOUNTER — Ambulatory Visit (INDEPENDENT_AMBULATORY_CARE_PROVIDER_SITE_OTHER): Payer: Medicare Other | Admitting: Neurology

## 2014-10-04 VITALS — BP 124/78 | HR 78 | Resp 20 | Ht 64.5 in | Wt 169.0 lb

## 2014-10-04 DIAGNOSIS — R4189 Other symptoms and signs involving cognitive functions and awareness: Secondary | ICD-10-CM | POA: Diagnosis not present

## 2014-10-04 DIAGNOSIS — G43101 Migraine with aura, not intractable, with status migrainosus: Secondary | ICD-10-CM | POA: Diagnosis not present

## 2014-10-04 MED ORDER — OXYCODONE-ACETAMINOPHEN 10-325 MG PO TABS: 1.0000 | ORAL_TABLET | Freq: Four times a day (QID) | ORAL | Status: DC | PRN

## 2014-10-04 MED ORDER — DONEPEZIL HCL 5 MG PO TABS: 5.0000 mg | ORAL_TABLET | Freq: Every day | ORAL | Status: DC

## 2014-10-04 MED ORDER — MIRTAZAPINE 30 MG PO TBDP: ORAL_TABLET | ORAL | Status: DC

## 2014-10-04 MED ORDER — NARATRIPTAN HCL 2.5 MG PO TABS: 2.5000 mg | ORAL_TABLET | ORAL | Status: DC | PRN

## 2014-10-04 NOTE — Progress Notes (Signed)
[facility]  Provider:  Larey Seat, M D  Referring Provider: Chesley Noon, MD Primary Care Physician:  Chesley Noon, MD  Chief Complaint  Patient presents with  . Follow-up    memory, rm 11, alone    HPI:  Lauren Fuller is a 59 y.o. female  , PCP is Dr. Melford Aase .   PROLONGED REVISIT on 10-04-14 , THIS PATIENT HAS BEEN FOLLOWED AT [facility]  FOR MEMORY LOSS, FOLLOWING THE DIAGNOSIS AND TREATMENT OF BREAST CANCER .   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 In the past her Mini-Mental status examinations showed scores as high as 29 out of 30 points. But the patient still was concerned about her fatigability her ability to sustain cognitive function over the work day for example. He also had significant headaches which led to further impairment. Today I performed a Montral cognitive assessment test with the patient and she scored a remarkable 28 out of 30 points I do think that she is 90% recovered from her impairment previously for example she was able to recall 3 out of 5 words these 2 missed words of the only points she missed on the [facility] test she was able to draw a three-dimensional cube a clock face perform a trail making test name subtract and spell. She is fully oriented to time and place. Word fluency was 17 words beginning with the letter F. She has been followed for headache treatments at the [facility] the Botox has helped her a lot and she is been now on Botox treatments for about 3 years.   HPI: Has been an established patient of our practice. In 2007 she was diagnosed with breast cancer. This diagnosis was followed by radiation and chemotherapy and soon afterwards,  following double mastectomies , she developed cognitive impairment.She  had regular and repeated memory tests here at the office. The patient had seen Dr. Valentina Shaggy , neuropsychologist, who found that the patient would be long-term disabled due to her cognitive impairment easy distractibility and tangential thinking. Also multitasking difficulties remained evident.  Interesting is that the patient had examinations liver not showing that severe of an impairment,  but she had frequent memory problems,  would forget which road exit she was supposed to take .  Forgot passwords for the  computer and her family had noticed significant executive and attention problems. Trial of Adderall have not made that much difference for her . Sleep testing showed very mild apnea. Migraines became almost disabling for her at the same time.  The use of zonisamide managed to treat her headaches. She did develop some insomnia and responded to Botox treatments at [facility] .She did not take frequent naps.  From 2012 on and finally in March 2014 some prolonged relief from migraines with Botox treatments at [facility].  The patient used to work as an Interior and spatial designer.  Mini-Mental status exam revealed 29 of 30 points only one of the 5 recall words was missed. This is the same as she scored in November 2013 March 2013., In September 2012. Clock drawing and animal fluency testing were also unremarkable.   In her review of systems she endorsed olfactory triggers for headaches as well as weather changes it seems that drain, higher air pressure and strong  odors or sense can cause her headaches. 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  Dr. Valentina Shaggy has seen the patient and found her inattentive and unable to sustain and maintain a thought process.     History   Social History  . Marital Status: Married    Spouse Name: Konrad Dolores  . Number of Children: 2  . Years of Education: BS   Occupational History   .     Social History Main Topics  . Smoking status: Never Smoker   . Smokeless tobacco: Never Used  . Alcohol Use: No  . Drug Use: No  . Sexual Activity: Not on file   Other Topics Concern  . 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   Patient has two children.   Patient is disabled.   Patient drinks caffeine three to four times per week.   Patient is right-handed.   Patient has a Financial planner.    Family History  Problem Relation Age of Onset  . Heart disease Mother   . Heart disease Father   . Cancer Maternal Uncle     leukemia  . Cancer Sister     lung    Past Medical History  Diagnosis Date  . ALLERGIC RHINITIS 10/27/2006  . BREAST CANCER, HX OF 10/27/2006  . GERD 10/27/2006  . HYPERLIPIDEMIA 10/27/2006  . HYPERTENSION 10/27/2006  . PULMONARY EMBOLISM, HX OF 10/27/2006  . RENAL INSUFFICIENCY 08/01/2009  . Migraines   . Anemia   . Clotting disorder   . Organic brain syndrome     Past Surgical History  Procedure Laterality Date  . Abdominal hysterectomy    . Cholecystectomy    . Breast surgery      mastectomy  . Tubal ligation    . Cesarean section    . Nissen fundoplication    . Knee surgery      left  . Vocal cord injection      Current Outpatient Prescriptions  Medication Sig Dispense Refill  . ascorbic acid (VITAMIN C) 1000 MG tablet 1,000 mg.    . atorvastatin (LIPITOR) 40 MG tablet TAKE 1 TABLET DAILY 90 tablet 3  . donepezil (ARICEPT) 5 MG tablet TAKE ONE TABLET BY MOUTH ONCE DAILY 90 tablet 2  . Flaxseed, Linseed, (FLAXSEED OIL) 1000 MG CAPS Take by mouth daily.    . Folic Acid-Vit X5-TZG Y17 (B COMPLEX-FOLIC ACID) 494-4-967 MCG-MG-MCG TABS Take by mouth.    . Glucosamine-Chondroit-Vit C-Mn (GLUCOSAMINE 1500 COMPLEX) CAPS Take by mouth daily.    . meloxicam (MOBIC) 7.5 MG tablet Take 7.5 mg by mouth daily.      . methocarbamol (ROBAXIN) 500 MG tablet Take 500 mg by  mouth every 6 (six) hours as needed for muscle spasms (Every 6-8 hours as needed).     . mirtazapine (REMERON SOL-TAB) 30 MG disintegrating tablet DISSOLVE ONE TABLET IN MOUTH AT BEDTIME 30 tablet 3  . Multiple Vitamin (MULTIVITAMIN) capsule Take 1 capsule by mouth daily.    . naratriptan (AMERGE) 2.5 MG tablet Take 2.5 mg by mouth as needed. Take one (1) tablet at onset of headache; if returns or does not resolve, may repeat after 4 hours; do not exceed five (5) mg in 24 hours.    . Omega-3 Fatty Acids (FISH OIL) 1000 MG CAPS 1  capsule.    Marland Kitchen omeprazole-sodium bicarbonate (ZEGERID) 40-1100 MG per capsule Take 1 capsule by mouth 2 (two) times daily.      . Prenatal w/o A Vit-Fe Fum-FA (BP MULTINATAL PLUS) 30-1 MG TABS Take by mouth.    . venlafaxine (EFFEXOR) 75 MG tablet Take 75 mg by mouth daily. Take 3 tablets by mouth daily    . VITAMIN D, CHOLECALCIFEROL, PO Take by mouth.    . Calcium Carbonate-Vitamin D (CALCIUM 600-D) 600-400 MG-UNIT per tablet 1 tablet.    . fenofibrate (TRICOR) 145 MG tablet 145 mg.    . temazepam (RESTORIL) 22.5 MG capsule Take 1 capsule (22.5 mg total) by mouth at bedtime as needed for sleep. (Patient not taking: Reported on 10/04/2014) 30 capsule 0  . zonisamide (ZONEGRAN) 100 MG capsule Take 100 mg by mouth 4 (four) times daily as needed.     No current facility-administered medications for this visit.    Allergies as of 10/04/2014 - Review Complete 10/04/2014  Allergen Reaction Noted  . Aspirin-acetaminophen-caffeine Anaphylaxis 11/02/2009  . Butalbital-aspirin-caffeine  09/09/2013  . Butalbital-aspirin-caffeine  09/22/2013  . Codeine Itching 11/02/2009  . Cyclosporine Other (See Comments) and Itching 09/09/2013  . Fiorinal p-f w/codeine  [butalbital-asa-caff-codeine] Other (See Comments) 09/22/2013  . Hydromorphone hcl  11/02/2009  . Pentazocine lactate  11/02/2009  . Pentazocine lactate  04/18/2011  . Pentazocine Anxiety 09/09/2013    Vitals: BP 124/78  mmHg  Pulse 78  Resp 20  Ht 5' 4.5" (1.638 m)  Wt 169 lb (76.658 kg)  BMI 28.57 kg/m2 Last Weight:  Wt Readings from Last 1 Encounters:  10/04/14 169 lb (76.658 kg)   Last Height:   Ht Readings from Last 1 Encounters:  10/04/14 5' 4.5" (1.638 m)    Physical exam:  General: The patient is awake, alert and appears not in acute distress. The patient is well groomed. Head: Normocephalic, atraumatic. Neck is supple. Mallampati 3 , neck circumference: 15.5 inches.  Cardiovascular:  Regular rate and rhythm , without  murmurs or carotid bruit, and without distended neck veins. Respiratory: Lungs are clear to auscultation. Skin:  Without evidence of edema, or rash Trunk: BMI is elevated and patient  has normal posture.  Neurologic exam : The patient is awake and alert, oriented to place and time.  Memory subjective described as impaired, mainly in speech, speed and wordfinding.  " intricate and long problems solving is hampered by inattentiveness, loss i of train of thought.  MOCA was 28-30 and MMSE was 30-30 . There is a normal attention span & concentration ability.  Speech is fluent without  dysarthria, dysphonia or aphasia. Mood and affect are appropriate.  Cranial nerves:" Pupils are equal and briskly reactive to light.Extraocular movements  in vertical and horizontal planes intact and without nystagmus. Visual fields by finger perimetry are intact. Hearing to finger rub intact.  Facial sensation intact to fine touch. Facial motor strength is symmetric and tongue and uvula move midline.  Motor exam:  Symmetric normal strength in all extremities.  Sensory:  Fine touch, pinprick and vibration were tested in all extremities. Proprioception is  normal.  Coordination: Rapid alternating movements in the fingers/hands is tested and normal.  Finger-to-nose maneuver tested and normal without evidence of ataxia, dysmetria or tremor.  Gait and station: Patient walks without assistive  device and is able and assisted stool climb up to the exam table. Strength within normal limits. Stance is stable and normal. Steps are unfragmented. Romberg testing is normal.  Deep tendon reflexes: in the  upper and lower extremities are symmetric and intact. Babinski maneuver response is downgoing.   Assessment:  After physical and neurologic examination, review of laboratory studies, imaging, neurophysiology testing and pre-existing records, assessment :    Mild cognitive impairment  has improved,  Resolved - yet she has daily examples of executive memory dysfunction, inattentiv, word finding problems.   Migraine , well controlled on BOTOX   Insomnia, controlled on medication. Refilled today .

## 2014-10-04 NOTE — Addendum Note (Signed)
Addended by: Larey Seat on: 10/04/2014 12:08 PM   Modules accepted: Orders, Medications

## 2015-01-01 ENCOUNTER — Telehealth: Payer: Self-pay | Admitting: Neurology

## 2015-01-01 MED ORDER — MIRTAZAPINE 30 MG PO TBDP: ORAL_TABLET | ORAL | Status: DC

## 2015-01-01 NOTE — Telephone Encounter (Signed)
Patient called to request refill on mirtazapine (REMERON SOL-TAB) 30 MG disintegrating tablet also needs a 90 day supply called in due to [employer]. Patient needs this called in today for pick up later today. She is completely out of this medication. Call if questions [phone].

## 2015-01-01 NOTE — Telephone Encounter (Signed)
Rx has been resent for 90 day supply instead of 30 per patient request.  Receipt confirmed by pharmacy.

## 2015-04-05 ENCOUNTER — Ambulatory Visit (INDEPENDENT_AMBULATORY_CARE_PROVIDER_SITE_OTHER): Payer: Medicare HMO | Admitting: Neurology

## 2015-04-05 ENCOUNTER — Encounter: Payer: Self-pay | Admitting: Neurology

## 2015-04-05 VITALS — BP 122/82 | HR 82 | Resp 20 | Ht 64.0 in | Wt 171.0 lb

## 2015-04-05 DIAGNOSIS — G43101 Migraine with aura, not intractable, with status migrainosus: Secondary | ICD-10-CM

## 2015-04-05 DIAGNOSIS — F0789 Other personality and behavioral disorders due to known physiological condition: Secondary | ICD-10-CM | POA: Diagnosis not present

## 2015-04-05 DIAGNOSIS — Z853 Personal history of malignant neoplasm of breast: Secondary | ICD-10-CM | POA: Diagnosis not present

## 2015-04-05 DIAGNOSIS — F09 Unspecified mental disorder due to known physiological condition: Secondary | ICD-10-CM | POA: Diagnosis not present

## 2015-04-05 DIAGNOSIS — R4189 Other symptoms and signs involving cognitive functions and awareness: Secondary | ICD-10-CM

## 2015-04-05 MED ORDER — MIRTAZAPINE 30 MG PO TBDP: ORAL_TABLET | ORAL | Status: DC

## 2015-04-05 MED ORDER — DONEPEZIL HCL 5 MG PO TABS: 5.0000 mg | ORAL_TABLET | Freq: Every day | ORAL | Status: DC

## 2015-04-05 NOTE — Progress Notes (Signed)
[facility]  Provider:  Larey Seat, M D  Referring Provider: Chesley Noon, MD Primary Care Physician:  Chesley Noon, MD  Chief Complaint  Patient presents with  . Follow-up    not as many headaches, had her ear pierced and it helped, memory has not been good, rm 10    HPI:  Lauren Fuller is a 60 y.o. female  , PCP is Dr. Melford Aase .   PROLONGED REVISIT on 10-04-14 , THIS PATIENT HAS BEEN FOLLOWED AT [facility]  FOR MEMORY LOSS, FOLLOWING THE DIAGNOSIS AND TREATMENT OF BREAST CANCER .   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 In the past her Mini-Mental status examinations showed scores as high as 29 out of 30 points. But the patient still was concerned about her fatigability, her ability to sustain cognitive function over the length of the work day. He also had significant headaches which led to further impairment.  We performed a Montral cognitive assessment test in July 2016  with the patient and she scored a remarkable 28 out of 30 points I do think that she is 90% recovered from her impairment previously for example she was able to recall 3 out of 5 words these 2 missed words of the only points she missed on the [facility] test she was able to draw a three-dimensional cube a clock face perform a trail making test name subtract and spell. She is fully oriented to time and place. Word fluency was 17 words beginning with the letter F. She has been followed for headache treatments at the [facility] the Botox has helped her a lot and she is been now on Botox treatments for about 3 years.  Interval history from 04/05/2015. I follow Lauren Fuller today for her history of brain organic syndrome this cognitive problems. My last visit 6 months ago revealed no  cognitive impairment anymore. Today again she scored 28 out of 30 points on the Montral cognitive assessment test word fluency was 18. Clock face was full, she only missed 1 on the correct date and one point on delayed recall.  Montreal Cognitive Assessment  04/05/2015  Visuospatial/ Executive (0/5) 5  Naming (0/3) 3  Attention: Read list of digits (0/2) 2  Attention: Read list of letters (0/1) 1  Attention: Serial 7 subtraction starting at 100 (0/3) 3  Language: Repeat phrase (0/2) 2  Language : Fluency (0/1) 1  Abstraction (0/2) 2  Delayed Recall (0/5) 4  Orientation (0/6) 5  Total 28  Adjusted Score (based on education) 51   Lauren Fuller has been driving her husband after a shoulder surgery, has lost her way. She forgets assignments,  Her ability to think ahead is lacking. Her reaction time seems to be a little slower especially when driving. Her husband has been concerned that she seems not to anticipate the next step. Word finding is still sometimes complicated and her husband and children have filled in when she gets stuck mid-  sentence. She named today as the 18th, not the 19th. She is easier distracted, unable to do multi steps in chronological order. She often has to reread chapter in a book because she forgot it is unable to connected to the current reread pages. The patient has less migraines and she had 2 and  3 years ago she has followed this Botox treatments at [facility] and she had the tragus of the right ear pierced. She felt that this form of micropuncture has also helped.    HPI: Has been an established patient of our practice. In 2007 she was diagnosed with breast cancer. This diagnosis was followed by radiation and chemotherapy and soon afterwards,  following double mastectomies , she developed cognitive impairment.She had regular and repeated memory tests here at the office. The patient had seen Dr. Valentina Shaggy , neuropsychologist, who found that the patient would be  long-term disabled due to her cognitive impairment easy distractibility and tangential thinking. Also multitasking difficulties remained evident.  Interesting is that the patient had examinations liver not showing that severe of an impairment,  but she had frequent memory problems,  would forget which road exit she was supposed to take .  Forgot passwords for the  computer and her family had noticed significant executive and attention problems. Trial of Adderall have not made that much difference for her . Sleep testing showed very mild apnea. Migraines became almost disabling for her at the same time.  The use of zonisamide managed to treat her headaches. She did develop some insomnia and responded to Botox treatments at [facility] .She did not take frequent naps.  From 2012 on and finally in March 2014 some prolonged relief from migraines with Botox treatments at [facility].  The patient used to work as an Interior and spatial designer.  Mini-Mental status exam revealed 29 of 30 points only one of the 5 recall words was missed. This is the same as she scored in November 2013 March 2013., In September 2012. Clock drawing and animal fluency testing were also unremarkable. In her review of systems she endorsed olfactory triggers for headaches as well as weather changes it seems that drain, higher air pressure and strong odors or sense can cause her headaches. 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  Dr. Valentina Shaggy has seen the patient and found her inattentive and unable to sustain and maintain a thought process.     Social History   Social History  . Marital Status: Married    Spouse Name: Konrad Dolores  . Number of Children: 2  . Years of Education: BS   Occupational History  .     Social History Main Topics  . Smoking status: Never Smoker   . Smokeless tobacco: Never Used  . Alcohol Use: No  . Drug Use: No   . Sexual Activity: Not on file   Other Topics Concern  . 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   Patient has two children.   Patient is disabled.   Patient drinks caffeine three to four times per week.   Patient is right-handed.   Patient has a Financial planner.    Family History  Problem Relation Age of Onset  . Heart disease Mother   . Heart disease Father   . Cancer Maternal Uncle     leukemia  . Cancer Sister     lung    Past Medical History  Diagnosis Date  . ALLERGIC RHINITIS 10/27/2006  . BREAST CANCER, HX OF 10/27/2006  . GERD 10/27/2006  . HYPERLIPIDEMIA 10/27/2006  . HYPERTENSION 10/27/2006  . PULMONARY EMBOLISM, HX OF 10/27/2006  .  RENAL INSUFFICIENCY 08/01/2009  . Migraines   . Anemia   . Clotting disorder ([place])   . Organic brain syndrome   . Memory loss     Past Surgical History  Procedure Laterality Date  . Abdominal hysterectomy    . Cholecystectomy    . Breast surgery      mastectomy  . Tubal ligation    . Cesarean section    . Nissen fundoplication    . Knee surgery      left  . Vocal cord injection      Current Outpatient Prescriptions  Medication Sig Dispense Refill  . ascorbic acid (VITAMIN C) 1000 MG tablet 1,000 mg.    . atorvastatin (LIPITOR) 40 MG tablet TAKE 1 TABLET DAILY 90 tablet 3  . donepezil (ARICEPT) 5 MG tablet Take 1 tablet (5 mg total) by mouth daily. 90 tablet 2  . Flaxseed, Linseed, (FLAXSEED OIL) 1000 MG CAPS Take by mouth daily.    . Folic Acid-Vit [id number] (B COMPLEX-FOLIC ACID) [id number] MCG-MG-MCG TABS Take by mouth.    . Glucosamine-Chondroit-Vit C-Mn (GLUCOSAMINE 1500 COMPLEX) CAPS Take by mouth daily.    . meloxicam (MOBIC) 7.5 MG tablet Take 7.5 mg by mouth daily.      . methocarbamol (ROBAXIN) 500 MG tablet Take 500 mg by mouth every 6 (six) hours as needed for muscle spasms (Every 6-8 hours as needed).     . mirtazapine (REMERON  SOL-TAB) 30 MG disintegrating tablet DISSOLVE ONE TABLET IN MOUTH AT BEDTIME 90 tablet 1  . Multiple Vitamin (MULTIVITAMIN) capsule Take 1 capsule by mouth daily.    . naratriptan (AMERGE) 2.5 MG tablet Take 1 tablet (2.5 mg total) by mouth as needed. Take one (1) tablet at onset of headache; if returns or does not resolve, may repeat after 4 hours; do not exceed five (5) mg in 24 hours. 30 tablet 1  . Omega-3 Fatty Acids (FISH OIL) 1000 MG CAPS 1 capsule.    Marland Kitchen omeprazole-sodium bicarbonate (ZEGERID) 40-1100 MG per capsule Take 1 capsule by mouth 2 (two) times daily.      Marland Kitchen oxyCODONE-acetaminophen (PERCOCET) 10-325 MG per tablet Take 1 tablet by mouth every 6 (six) hours as needed for pain. 30 tablet 0  . Prenatal w/o A Vit-Fe Fum-FA (BP MULTINATAL PLUS) 30-1 MG TABS Take by mouth.    . venlafaxine (EFFEXOR) 75 MG tablet Take 75 mg by mouth daily. Take 3 tablets by mouth daily    . VITAMIN D, CHOLECALCIFEROL, PO Take by mouth.    . Calcium Carbonate-Vitamin D (CALCIUM 600-D) 600-400 MG-UNIT per tablet 1 tablet.    . fenofibrate (TRICOR) 145 MG tablet 145 mg.     No current facility-administered medications for this visit.    Allergies as of 04/05/2015 - Review Complete 04/05/2015  Allergen Reaction Noted  . Aspirin-acetaminophen-caffeine Anaphylaxis 11/02/2009  . Butalbital-aspirin-caffeine  09/09/2013  . Butalbital-aspirin-caffeine  09/22/2013  . Codeine Itching 11/02/2009  . Cyclosporine Other (See Comments) and Itching 09/09/2013  . Fiorinal p-f w/codeine  [butalbital-asa-caff-codeine] Other (See Comments) 09/22/2013  . Hydromorphone hcl  11/02/2009  . Pentazocine lactate  11/02/2009  . Pentazocine lactate  04/18/2011  . Pentazocine Anxiety 09/09/2013    Vitals: BP 122/82 mmHg  Pulse 82  Resp 20  Ht 5\' 4"  (1.626 m)  Wt 171 lb (77.565 kg)  BMI 29.34 kg/m2 Last Weight:  Wt Readings from Last 1 Encounters:  04/05/15 171 lb (77.565 kg)   Last Height:   Ht Readings  from Last 1  Encounters:  04/05/15 5\' 4"  (1.626 m)    Physical exam:  General: The patient is awake, alert and appears not in acute distress. The patient is well groomed. Head: Normocephalic, atraumatic. Neck is supple. Mallampati 3 , neck circumference: 15.5 inches.  Cardiovascular:  Regular rate and rhythm , without  murmurs or carotid bruit, and without distended neck veins. Respiratory: Lungs are clear to auscultation. Skin:  Without evidence of edema, or rash Trunk: BMI is elevated and patient  has normal posture.  Neurologic exam : The patient is awake and alert, oriented to place and time.  Memory subjective described as impaired, mainly in speech, speed and wordfinding.  " intricate and long problems solving is hampered by inattentiveness, loss i of train of thought.  MOCA was 28-30 and MMSE was 30-30 . There is a normal attention span & concentration ability.  Speech is fluent without  dysarthria, dysphonia or aphasia. Mood and affect are appropriate.  Cranial nerves:" Pupils are equal and briskly reactive to light.Extraocular movements  in vertical and horizontal planes intact and without nystagmus. Visual fields by finger perimetry are intact. Hearing to finger rub intact.  Facial sensation intact to fine touch. Facial motor strength is symmetric and tongue and uvula move midline.  Motor exam:  Symmetric normal strength in all extremities.  Sensory:  Fine touch, pinprick and vibration were tested in all extremities. Proprioception is  normal.  Coordination: Rapid alternating movements in the fingers/hands is tested and normal.  Finger-to-nose maneuver tested and normal without evidence of ataxia, dysmetria or tremor.  Gait and station: Patient walks without assistive device and is able and assisted stool climb up to the exam table. Strength within normal limits. Stance is stable and normal. Steps are unfragmented. Romberg testing is normal.  Deep tendon reflexes: in the  upper and  lower extremities are symmetric and intact. Babinski maneuver response is downgoing.   Assessment:  After physical and neurologic examination, review of laboratory studies, imaging, neurophysiology testing and pre-existing records, assessment :    1) Post chemotherapy Breastcancer treatment- developed  Cognitive problems ob ver the following month and years. This Mild cognitive impairment  has improved, I am unable to call this dementia but there is evidence of brain organic syndrome, see Dr Serena Colonel test result with ADD,  Not Resolved -  she has daily examples of executive memory dysfunction, inattentiveness, word finding problems. Donepizil,  X zelson revisit.    2)Migraine , well controlled on BOTOX , tragus right was pierced, she has abortive medication. Mrs. Scarber son Joneen Boers also has severe migraines in his case it leads to projectile vomiting.  3)Insomnia, controlled on medication. Refilled today.     Zadaya Cuadra, MD

## 2015-04-05 NOTE — Patient Instructions (Signed)
Mild Neurocognitive Disorder Mild neurocognitive disorder (formerly known as mild cognitive impairment) is a mental disorder. It is a slight abnormal decrease in mental function. The areas of mental function affected may include memory, thought, communication, behavior, and completion of tasks. The decrease is noticeable and measurable but for the most part does not interfere with your daily activities. Mild neurocognitive disorder typically occurs in people older than 60 years but can occur earlier. It is not as serious as major neurocognitive disorder (formerly known as dementia) but may lead to a more serious neurocognitive disorder. However, in some cases the condition does not get worse. A few people with this disorder even improve. CAUSES  There are a number of different causes of mild neurocognitive disorder:   Brain disorders associated with abnormal protein deposits, such as Alzheimer's disease, Pick's disease, and Lewy body disease.  Brain disorders associated with abnormal movement, such as Parkinson's disease and Huntington's disease.  Diseases affecting blood vessels in the brain and resulting in mini-strokes.  Certain infections, such as human immunodeficiency virus (HIV) infection.  Traumatic brain injury.  Other medical conditions such as brain tumors, underactive thyroid (hypothyroidism), and vitamin B12 deficiency.  Use of certain prescription medicine and "recreational" drugs. SYMPTOMS  Symptoms of mild neurocognitive disorder include:  Difficulty remembering. You may forget details of recent events, names, or phone numbers. You may forget important social events and appointments or repeatedly forget where you put your car keys.  Difficulty thinking and solving problems. You may have trouble with complex tasks such as paying bills or driving in unfamiliar locations.  Difficulty communicating. You may have trouble finding the right word, naming an object, forming a  sentence that makes sense, or understanding what you read or hear.  Changes in your behavior or personality. You may lose interest in the things that you used to enjoy or withdraw from social situations. You may get angry more easily than usual. You may act before thinking. You may do things in public that you would not usually do. You may hear or see things that are not real (hallucinations). You may believe falsely that others are trying to hurt you (paranoia). DIAGNOSIS Mild neurocognitive disorder is diagnosed through an assessment by your health care provider. Your health care provider will ask you and your family, friends, or coworkers questions about your symptoms. He or she will ask how often the symptoms occur, how long they have been occurring, whether they are getting worse, and the effect they are having on your life. Your health care provider may refer you to a neurologist or mental health specialist for a detailed evaluation of your mental functions (neuropsychological testing).  To identify the cause of your mild neurocognitive disorder, your health care provider may:  Obtain a detailed medical history.  Ask about alcohol and drug use, including prescription medicine.  Perform a physical exam.  Order blood tests and brain imaging exams. TREATMENT  Mild neurocognitive disorder caused by infections, use of certain medicines or "recreational" drugs, and certain medical conditions may improve with treatment of the condition that is causing the disorder. Mild neurocognitive disorder resulting from other causes generally does not improve and may worsen. In these cases, the goal of treatment is to slow progression of the disorder and help you cope with the loss of mental function. Treatments in these cases include:   Medicine. Medicine helps mainly with memory loss and behavioral symptoms.   Talk therapy. Talk therapy provides education, emotional support, memory aids, and other   ways of  making up for decreases in mental function.   Lifestyle changes. These include regular exercise, a healthy diet (including essential omega-3 fatty acids), intellectual stimulation, and increased social interaction.   This information is not intended to replace advice given to you by your health care provider. Make sure you discuss any questions you have with your health care provider.   Document Released: 11/03/2012 Document Revised: 03/24/2014 Document Reviewed: 11/03/2012 Elsevier Interactive Patient Education 2016 [employer]  

## 2015-07-02 ENCOUNTER — Ambulatory Visit: Payer: Medicare Other | Admitting: Psychology

## 2015-07-13 ENCOUNTER — Encounter: Payer: Self-pay | Admitting: Psychology

## 2015-07-13 ENCOUNTER — Ambulatory Visit: Payer: BC Managed Care – PPO | Attending: Psychology | Admitting: Psychology

## 2015-07-13 DIAGNOSIS — F09 Unspecified mental disorder due to known physiological condition: Secondary | ICD-10-CM | POA: Diagnosis not present

## 2015-07-13 NOTE — Progress Notes (Signed)
[facility]  [address]   Telephone [phone] [address] Fax [phone] [place], [place] [address]  Initial Contact Note  Name:  Lauren Fuller Date of Birth; 1955/10/20 MRN:  [id number] Date:  07/13/2015  Lauren Fuller is an 60 y.o. female who was referred for repeat neuropsychological evaluation by Larey Seat, Md due to her history of cognitive decline post-chemotherapy for breat cancer.    A total of 4 hours was spent today reviewing medical records, interviewing (CPT [phone]) Lauren Fuller and administering and scoring neurocognitive tests (CPT [phone] [id number]).  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   A full report will follow once the planned testing has been completed. Her next appointment is scheduled for 07/19/15.   Jamey Ripa, Ph.D Licensed Psychologist 07/13/2015

## 2015-07-19 ENCOUNTER — Ambulatory Visit: Payer: BC Managed Care – PPO | Attending: Psychology | Admitting: Psychology

## 2015-07-19 ENCOUNTER — Encounter: Payer: Self-pay | Admitting: Psychology

## 2015-07-19 DIAGNOSIS — F09 Unspecified mental disorder due to known physiological condition: Secondary | ICD-10-CM | POA: Diagnosis not present

## 2015-07-23 NOTE — Progress Notes (Signed)
[facility]  [address]   Telephone [phone] [address] Fax [phone] [place],  [address]   [facility] EVALUATION 2017  *CONFIDENTIAL* This report should not be released without the consent of the client  Name:    Lauren Fuller  Date of Birth:    December 03, 2055 Zac[place] Pontes MR#:  [id number] Dates of Evaluation:  07/13/15 & 07/19/15  Reason for Referral & Background Lauren Fuller is a 60 year-old woman who was referred for neuropsychological re-evaluation by Rexford Maus, MD of [facility]. Lauren Fuller has reported persisting cognitive difficulties since 2007 that she first noticed within a few weeks after she had been treated with chemotherapy for breast cancer. She has been on disability from her job as a second [employer] since 2010.   On her most recent neuropsychological evaluation in April 2014, she demonstrated a deficiency for executive functioning manifested by erratic ability to sustain visual attention, slowed reaction time and tendency to be distracted by irrelevant stimuli while attempting to solve novel problems. Her neuropsychological profile appeared relatively static as compared to one from 2011.   According to recent notes from Dr. Maia Petties, Lauren Fuller has reported persisting fatigue, inability to sustain cognitive function over time, slowed reaction time while driving, forgetfulness, word finding difficulties and headaches. There have been no major interval changes in her health status since 2014. Her current medications include atorvastatin, donepezil, meloxicam, methocarbamol as needed, mirtazapine, omeprazole, oxycodone-acetaminophen (taken only occasionally for migraine headache per Lauren Fuller) and venlafaxine. Botox treatments begun in late 2013 were noted as having been effective in controlling her migraine headaches.  Further background information can be found in the previous neuropsychological  reports and will not be repeated here.  Patient Report  Lauren Fuller stated that her cognitive functioning continues to fluctuate on a day to day basis with neither improvement nor worsening over the past three years. She cited her most frequent problems as losing her train of thought, failing to persist to a task and not retaining what she has read. She did not report any major changes in her health status over the past three years. She complained of occasional back pain and migraine headaches (averaging about four per month). She did not report any problems related to her sensory-perceptual or vegetative functioning. She described herself as anxious about her son's health as he has an intellectual disability and seizure disorder, but otherwise free of depression or maladaptive behavior. She continues to be monitored by a psychiatrist every six months since she suffered a major depressive episode in 2012. She continues to lead an independent lifestyle. She has been working fifteen hours per week with her son. She continues to receive disability income benefits.   Evaluation Procedures In addition to a review of medical records and a clinical interview, the following tests or questionnaires were administered:       Animal Naming Test Beck Depression Inventory-II  Connors Continuous Performance Test-II  Controlled Oral Word Association Test  Paced Auditory Serial Addition Test  Rey 15-Item Memory Test Stroop Color and Word Test Trail Making A & B Wechsler Adult Intelligence Scale-IV:  Coding, Digit Span, Symbol search  Wechsler Memory Scale-IV Flexible battery & Symbol Span [place] Card Sorting Test  Assessment Results Test results were considered to represent a valid measure of her current cognitive functioning. She did not display signs of physical or emotional distress. She did not report or display problems with vision (she wore her eyeglasses), hearing or motor  control. She was able to  comprehend task instructions without difficulty. She did not display signs of impulsive, careless or perseverative responding. There were no observed signs of inadequate effort. She performed within acceptable limits on a performance validity test (Rey 15-Item Memory Test) that required her to immediately draw fifteen symbols from memory that can be easily accomplished due to the redundancy amongst the items.  The possibility that any improved test performances might be due in part or full to "practice effects" stemming from her familiarity with the test materials was considered when interpreting test results.    Her test scores were corrected to reflect norms for her age and, whenever possible, her gender and educational level (i.e., 16 years).   Below is a side by side listing of her current and previous test scores:      April/May 2017       April 2014      Animal Naming Test Score= 16  9th   Score= 18 18th         Conners' Continuous Performance Test II (selected measures) Measure Guideline  Guideline  Omissions Within Average range  Mildly Atypical  Commissions Within Average range  Within Average range  Hit Reaction Time Atypically slow  Atypically slow  Detectability  (d') Good performance  Within Average range  Perseverations Within average range  Markedly Atypical  Hit RT ISI Change Markedly atypical  Markedly Atypical  Hit SE Block Change Within average range  Mildly Atypical      Controlled Oral Word Association Test Score= 47 58th   Score= 48 58th         Paced Auditory Serial Addition Test Series 1 31/49  38/49  Series 2 27/49  31/49  Series 3 21/49  25/49  Series 4 17/49  24/49  Total       96             19th       118               40th         Stroop Color & Word Test Word 72 <1st   80   8th  Color 73 37th   60   8th   Color-Word 33 25th   29 11th         Trails A Score= 38s 0e 19th   Score= 30s 0e 34th        Trails B Score= 78s 0e 21st   Score= 72s 0e 34th            Wechsler Adult Intelligence Scale-IV Subtest Age-corrected Scaled Score Percentile  Age-corrected Scaled Score  Percentile  Digit Span Backward Digit Span Seq.   7   9 16th  37th     7   9 16th 37th    Symbol Search 12  75th   13 84th   Coding  11 63rd   13 84th          Wechsler Memory Scale-IV Flexible Battery  Index Index Score Percentile  Index Score Percentile  Immediate Memory 108  70th    102 55th    Delayed Memory 103  58th     97 42nd      Auditory Memory 107  68th        95 37th    Visual Memory 102  55th   105 63rd        Symbol Span SS= 12  75th  SS=8 25th                [employer]  Total errors= 15 50th   43     10th   Perseverative errors=   7 53rd   25     16th   Categories=   6 >16th     3 6th - 10th   Trials to first category= 11 >16th   11   >16th   Failure to maintain set= 1 >16th     4 <=1st   Learning to learn= -1.52 11th- 16th   -10.3 2nd - 5th    Her neuropsychological profile indicated continued problems sustaining visual attention as her reaction times were very slow and highly inconsistent on a visual vigilance task (Conners' Continuous Performance Test II (CPT-II)).  Compared to the last administration, some of her CPT-II indices were improved though not to the point of producing an overall normal protocol. Measures of sustained auditory attention and processing speed (Paced Auditory Serial Addition Test), set shifting efficiency (Trails B) and semantic fluency (Animal Naming Test), which all share a reliance on cognitive flexibility, were within the Low Average range, which was lower than pre-morbid expectations. Her levels of performance on these tests were slightly lower than in 2013 though it is not known if these differences were statistically significant. Her performance on a task that required selective attention and response inhibition (Stroop Color & Word Test) fell at the lower boundary of the Average range, which appeared  to be somewhat better than last time. Her memory abilities (Wechsler Memory Scale-IV Flexible Battery) continue to fall within the Average range. Compared to the prior evaluation, she performed significantly better on measures of her ability to learn and retain orally-presented information (i.e., from 37th to 68th percentile) and visual working memory (i.e., from 25th to [employer]). She also performed better on a test of nonverbal problem-solving [employer]), primarily due to improved set maintenance, though it could not be determined whether this represented a true improvement or was related to her familiarity with this task from previous administrations.   Assessment of her emotional functioning indicated that she reported a minimal level of psychological distress, both verbally as well as on a standardized symptom questionnaire Olevia Bowens Depression Inventory-II score=5).    Summary & Conclusions Lauren Fuller is a 60 year-old woman with a history of mild cognitive difficulties with onset after she underwent chemotherapy to treat breast cancer in 2007. She was refereed for a repeat neuropsychological evaluation to obtain an updated assessment of her cognitive functioning as compared to a prior neuropsychological evaluation in 2014.  Lauren Fuller stated that her cognitive functioning has continued to fluctuate on a day to day basis with neither improvement nor worsening over the past three years. She cited her most frequent problems as losing her train of thought, failing to persist to a task and not retaining what she has read. She reported minimal psychological distress or problems with psychosocial adjustment.   Repeat neuropsychological testing indicated continued subnormal performance on a test of sustained visual attention. Measures of cognitive flexibility were lower than expected as they mostly fell within the Low Average range. Her absolute levels of performance on most tests  that required cognitive flexibility were slightly lower than in 2013 although these differences may not be statistically significant. Her memory abilities continued to fall within the Average range with significant improvement shown on measures of auditory memory and visual working memory. She also  performed better on a test of nonverbal problem-solving though it could not be determined whether this represented a true improvement or was related to her familiarity with this task from previous administrations.   In the final analysis, her neuropsychological profile was suggestive of a mild, relatively static reduction in attentional-executive functioning.  No specific recommendations are offered at this time.   I have again appreciated the opportunity to evaluate Lauren Fuller. The results from this evaluation were discussed with her on 07/19/15. Please feel free to contact me with any comments or questions.     ______________________ Jamey Ripa, Ph.D Licensed Psychologist

## 2015-10-04 ENCOUNTER — Ambulatory Visit: Payer: Medicare HMO | Admitting: Adult Health

## 2015-10-15 ENCOUNTER — Ambulatory Visit (INDEPENDENT_AMBULATORY_CARE_PROVIDER_SITE_OTHER): Payer: Medicare HMO | Admitting: Adult Health

## 2015-10-15 ENCOUNTER — Encounter: Payer: Self-pay | Admitting: Adult Health

## 2015-10-15 VITALS — BP 142/84 | HR 100 | Ht 64.0 in | Wt 174.0 lb

## 2015-10-15 DIAGNOSIS — G43009 Migraine without aura, not intractable, without status migrainosus: Secondary | ICD-10-CM | POA: Diagnosis not present

## 2015-10-15 DIAGNOSIS — F09 Unspecified mental disorder due to known physiological condition: Secondary | ICD-10-CM

## 2015-10-15 DIAGNOSIS — G479 Sleep disorder, unspecified: Secondary | ICD-10-CM

## 2015-10-15 NOTE — Progress Notes (Signed)
I agree with the assessment and plan as directed by NP .The patient is known to me .   Virginio Isidore, MD  

## 2015-10-15 NOTE — Progress Notes (Signed)
PATIENT: Lauren Fuller DOB: 1955-03-21  REASON FOR VISIT: follow up- memory HISTORY FROM: patient  HISTORY OF PRESENT ILLNESS: Ms. Halliwill is a 60 year old female with a history of cognitive dysfunction after chemotherapy for breast cancer treatment and migraine headaches. She returns today for follow-up. She remains on Aricept and is tolerating it well. She continues to notice changes with her memory. She states that she can be in mid conversation and lose her train of thought. She states that if she thinks about it long enough she will be able to recall her thoughts. She states that she operates a motor vehicle but does occasionally get lost. Again she is able to recall the correct directions. She prepares all meals without difficulty. Able to complete all ADLs independently. She is on Remeron at bedtime. She states that she continues to have a hard time falling asleep. Once asleep she may get up 2-3 times a night usually to urinate. Her migraine headaches are stable. She reports that she has approximately one headache a week. She continues to use Amerge with good benefit. She returns today for an evaluation.  HISTORY [facility]) Mrs. 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 In the past her Mini-Mental status examinations showed scores as high as 29 out of 30 points. But the patient still was concerned about her fatigability, her ability to sustain cognitive function over the length of the work day. He also had significant headaches which led to further impairment.  We performed a Montral cognitive assessment test in July 2016  with the patient and she scored a remarkable 28 out of 30 points I do think that she is 90% recovered from her impairment previously for example she was able to recall 3  out of 5 words these 2 missed words of the only points she missed on the [facility] test she was able to draw a three-dimensional cube a clock face perform a trail making test name subtract and spell. She is fully oriented to time and place. Word fluency was 17 words beginning with the letter F. She has been followed for headache treatments at the [facility] the Botox has helped her a lot and she is been now on Botox treatments for about 3 years.  Interval history from 04/05/2015. I follow Mrs. Kanaan today for her history of brain organic syndrome this cognitive problems. My last visit 6 months ago revealed no cognitive impairment anymore. Today again she scored 28 out of 30 points on the Montral cognitive assessment test word fluency was 18. Clock face was full, she only missed 1 on the correct date and one point on delayed recall.  HPI: Has been an established patient of our practice. In 2007 she was diagnosed with breast cancer. This diagnosis was followed by radiation and chemotherapy and soon afterwards,  following double mastectomies , she developed cognitive impairment.She had regular and repeated memory tests here at the office. The patient had seen Dr. Valentina Shaggy , neuropsychologist, who found that the patient would be long-term disabled due to her cognitive impairment easy distractibility and tangential thinking. Also multitasking difficulties remained evident.  Interesting is that the patient had examinations liver not showing that severe of an impairment,  but she had frequent memory problems,  would forget which road exit she was supposed to  take .  Forgot passwords for the  computer and her family had noticed significant executive and attention problems. Trial of Adderall have not made that much difference for her . Sleep testing showed very mild apnea. Migraines became almost disabling for her at the same time.  The use of zonisamide managed to treat her headaches. She did develop some  insomnia and responded to Botox treatments at [facility] .She did not take frequent naps.  From 2012 on and finally in March 2014 some prolonged relief from migraines with Botox treatments at [facility].  The patient used to work as an Interior and spatial designer.  Mini-Mental status exam revealed 29 of 30 points only one of the 5 recall words was missed. This is the same as she scored in November 2013 March 2013., In September 2012. Clock drawing and animal fluency testing were also unremarkable. In her review of systems she endorsed olfactory triggers for headaches as well as weather changes it seems that drain, higher air pressure and strong odors or sense can cause her headaches. 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  Dr. Valentina Shaggy has seen the patient and found her inattentive and unable to sustain and maintain a thought process.    REVIEW OF SYSTEMS: Out of a complete 14 system review of symptoms, the patient complains only of the following symptoms, and all other reviewed systems are negative.  See history of present illness  ALLERGIES: Allergies  Allergen Reactions  . Aspirin-Acetaminophen-Caffeine Anaphylaxis    Excedrin - jittery excedrin REACTION: makes me really nervous inside  . Butalbital-Aspirin-Caffeine     dizzy  . Butalbital-Aspirin-Caffeine     dizzy  . Codeine Itching    REACTION: itching  . Cyclosporine Other (See Comments) and Itching  . Fiorinal P-F W/Codeine  [Butalbital-Asa-Caff-Codeine] Other (See Comments)    FLORINAL 3.- severe sedation  . Hydromorphone Hcl     REACTION: not effective  . Pentazocine Lactate     REACTION: hallucinations  . Pentazocine Lactate     Makes pt feel drunk  . Pentazocine Anxiety    hallucinations    HOME MEDICATIONS: Outpatient Medications Prior to Visit  Medication Sig Dispense Refill  . ascorbic acid (VITAMIN C) 1000 MG tablet  1,000 mg.    . atorvastatin (LIPITOR) 40 MG tablet TAKE 1 TABLET DAILY 90 tablet 3  . donepezil (ARICEPT) 5 MG tablet Take 1 tablet (5 mg total) by mouth daily. 90 tablet 2  . Flaxseed, Linseed, (FLAXSEED OIL) 1000 MG CAPS Take by mouth daily.    . Folic Acid-Vit Q000111Q [id number] (B COMPLEX-FOLIC ACID) [id number] MCG-MG-MCG TABS Take by mouth.    . meloxicam (MOBIC) 7.5 MG tablet Take 7.5 mg by mouth daily.      . methocarbamol (ROBAXIN) 500 MG tablet Take 500 mg by mouth every 6 (six) hours as needed for muscle spasms (Every 6-8 hours as needed).     . mirtazapine (REMERON SOL-TAB) 30 MG disintegrating tablet DISSOLVE ONE TABLET IN MOUTH AT BEDTIME 90 tablet 1  . Multiple Vitamin (MULTIVITAMIN) capsule Take 1 capsule by mouth daily.    . naratriptan (AMERGE) 2.5 MG tablet Take 1 tablet (2.5 mg total) by mouth as needed. Take one (1) tablet at onset of headache; if returns or does not resolve, may repeat after 4 hours; do not exceed five (5) mg in 24 hours. 30 tablet 1  .  Omega-3 Fatty Acids (FISH OIL) 1000 MG CAPS 1 capsule.    Marland Kitchen omeprazole-sodium bicarbonate (ZEGERID) 40-1100 MG per capsule Take 1 capsule by mouth 2 (two) times daily.      Marland Kitchen oxyCODONE-acetaminophen (PERCOCET) 10-325 MG per tablet Take 1 tablet by mouth every 6 (six) hours as needed for pain. 30 tablet 0  . Prenatal w/o A Vit-Fe Fum-FA (BP MULTINATAL PLUS) 30-1 MG TABS Take by mouth.    . venlafaxine (EFFEXOR) 75 MG tablet Take 75 mg by mouth daily. Take 3 tablets by mouth daily    . VITAMIN D, CHOLECALCIFEROL, PO Take by mouth.    . Calcium Carbonate-Vitamin D (CALCIUM 600-D) 600-400 MG-UNIT per tablet 1 tablet.    . fenofibrate (TRICOR) 145 MG tablet 145 mg.    . Glucosamine-Chondroit-Vit C-Mn (GLUCOSAMINE 1500 COMPLEX) CAPS Take by mouth daily.     No facility-administered medications prior to visit.     PAST MEDICAL HISTORY: Past Medical History:  Diagnosis Date  . ALLERGIC RHINITIS 10/27/2006  . Anemia   . BREAST CANCER, HX  OF 10/27/2006  . Clotting disorder ([place])   . GERD 10/27/2006  . HYPERLIPIDEMIA 10/27/2006  . HYPERTENSION 10/27/2006  . Memory loss   . Migraines   . Organic brain syndrome   . PULMONARY EMBOLISM, HX OF 10/27/2006  . RENAL INSUFFICIENCY 08/01/2009    PAST SURGICAL HISTORY: Past Surgical History:  Procedure Laterality Date  . ABDOMINAL HYSTERECTOMY    . BREAST SURGERY     mastectomy  . CESAREAN SECTION    . CHOLECYSTECTOMY    . KNEE SURGERY     left  . NISSEN FUNDOPLICATION    . TUBAL LIGATION    . VOCAL CORD INJECTION      FAMILY HISTORY: Family History  Problem Relation Age of Onset  . Heart disease Mother   . Heart disease Father   . Cancer Maternal Uncle     leukemia  . Cancer Sister     lung    SOCIAL HISTORY: Social History   Social History  . Marital status: Married    Spouse name: Konrad Dolores  . Number of children: 2  . Years of education: BS   Occupational History  .  [employer]   Social History Main Topics  . Smoking status: Never Smoker  . Smokeless tobacco: Never Used  . Alcohol use No  . Drug use: No  . Sexual activity: Not on file   Other Topics Concern  . 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   Patient has two children.   Patient is disabled.   Patient drinks caffeine three to four times per week.   Patient is right-handed.   Patient has a Financial planner.      PHYSICAL EXAM  Vitals:   10/15/15 1302  Weight: 174 lb (78.9 kg)  Height: 5\' 4"  (1.626 m)   Body mass index is 29.87 kg/m. Montreal Cognitive Assessment  10/15/2015 04/05/2015  Visuospatial/ Executive (0/5) 5 5  Naming (0/3) 3 3  Attention: Read list of digits (0/2) 1 2  Attention: Read list of letters (0/1) 1 1  Attention: Serial 7 subtraction starting at 100 (0/3) 3 3  Language: Repeat phrase (0/2) 2 2  Language : Fluency (0/1) 1 1  Abstraction (0/2) 2 2  Delayed Recall (0/5) 3 4    Orientation (0/6) 6 5  Total 27 28  Adjusted Score (based on education) - 28     Generalized: Well developed, in no acute distress   Neurological examination  Mentation: Alert oriented to time, place, history taking. Follows all commands speech and language fluent Cranial nerve II-XII: Pupils were equal round reactive to light. Extraocular movements were full, visual field were full on confrontational test. Facial sensation and strength were normal. Uvula tongue midline. Head turning and shoulder shrug  were normal and symmetric. Motor: The motor testing reveals 5 over 5 strength of all 4 extremities. Good symmetric motor tone is noted throughout.  Sensory: Sensory testing is intact to soft touch on all 4 extremities. No evidence of extinction is noted.  Coordination: Cerebellar testing reveals good finger-nose-finger and heel-to-shin bilaterally.  Gait and station: Gait is normal. Tandem gait is normal. Romberg is negative. No drift is seen.  Reflexes: Deep tendon reflexes are symmetric and normal bilaterally.   DIAGNOSTIC DATA (LABS, IMAGING, TESTING) - I reviewed patient records, labs, notes, testing and imaging myself where available.    ASSESSMENT AND PLAN 60 y.o. year old female  here with:  1. Cognitive dysfunction 2. Migraine headaches 3. Sleep disturbance  The patient's memory score has remained stable. She will remain on Aricept. Patient's headaches are also stable. She will continue using Amerge as needed. I did provide the patient with tips on promoting good sleep hygiene. She will continue using Remeron for now. Advised that if her symptoms worsen or she develops any new symptoms she she'll let us know. He'll follow-up in 6 months or sooner if needed.    Ward Givens, MSN, NP-C 10/15/2015, 1:04 PM [facility] [address], [place], [place] [address] [phone]

## 2015-10-15 NOTE — Patient Instructions (Signed)
Continue Amerge for headache Continue Aricept of memory- memory score is stable  Sleep hygiene tips: Eliminate TV/Phone/tablet 1 hour before bedtime Hot shower before bedtime Sleep in cold, dark room Eliminate caffeine after noon.   Continue Remeron If your symptoms worsen or you develop new symptoms please let us know.

## 2015-10-15 NOTE — Progress Notes (Signed)
I agree with the assessment and plan as directed by NP .The patient is known to me .   Luca Dyar, MD  

## 2015-12-14 ENCOUNTER — Other Ambulatory Visit: Payer: Self-pay | Admitting: Neurology

## 2015-12-28 ENCOUNTER — Other Ambulatory Visit: Payer: Self-pay | Admitting: Neurology

## 2015-12-28 NOTE — Telephone Encounter (Signed)
We cannot provide adequate pain management in chronic pain or narcotic pain therapy. There will be only weekly prescriptions.  The guidelines for  Opiate pain medication are now much stricter than 2 years ago.  What is the reason for not wanting to go to the pain clinic , if she needs only infrequent medications>? CD

## 2015-12-28 NOTE — Telephone Encounter (Signed)
Pt called in stating she will not be going to the pain clinic for her headaches anymore and is requesting that Dr. Brett Fairy prescribe her sumatriptan and pain medication for when she needs it. She states her migraines are infrequent but when she does have them she does need pain medication. [phone]

## 2015-12-31 NOTE — Telephone Encounter (Signed)
I spoke to pt. She says that her pain management clinic says that now that she is down to only 5-6 migraines per month, Dr. Brett Fairy should take over prescribing the naratriptan and percocet. She says that she only takes the naratriptan and percocet when she has a migraine and therefore only uses 10-15 tablets of percocet per month.  I advised her that I would let Dr. Brett Fairy know this information and call pt back with Dr. Edwena Felty response.

## 2016-01-01 NOTE — Telephone Encounter (Signed)
I spoke to pt. Her pain MD is Dr. Delorse Lek at [facility] [phone]).  Dr. Delorse Lek told her that unless she had 15 migraines per month, the pain clinic could not provide pain control for her, and her primary neurologist needed to prescribed pain medications.  Pt needs someone to prescribe the oxycodone and naratriptan for her.  How do you want to proceed? I advised her that you do not provide narcotics for chronic use.

## 2016-01-01 NOTE — Telephone Encounter (Signed)
I do not prescribe narcotics for chronic use. Who is her pain MD ?Can we  get a phone number ?  CD

## 2016-01-02 NOTE — Telephone Encounter (Signed)
Left message for dr Octavio Manns to call me back either Thursday or Friday.

## 2016-01-03 MED ORDER — OXYCODONE-ACETAMINOPHEN 10-325 MG PO TABS: 1.0000 | ORAL_TABLET | Freq: Four times a day (QID) | ORAL | 0 refills | Status: DC | PRN

## 2016-01-03 MED ORDER — NARATRIPTAN HCL 2.5 MG PO TABS: 2.5000 mg | ORAL_TABLET | ORAL | 1 refills | Status: DC | PRN

## 2016-01-03 NOTE — Telephone Encounter (Signed)
I faxed the signed naratriptan RX to [facility] in [place]. Received a receipt of confirmation.  I called pt and advised her thather RX for percocet is available for pick up at the front desk. Pt verbalized understanding.

## 2016-01-03 NOTE — Telephone Encounter (Signed)
Dr. Brett Fairy advised me that she spoke to Lauren Fuller at [facility], and advised me also that Dr. Brett Fairy will be managing Lauren Fuller's naratriptan and oxycodone RXs from now on.  I called Lauren Fuller and advised her of this information. Lauren Fuller says that she needs a refill on both RXs. I advised her that I would call her again when these RXs are ready. Lauren Fuller verbalized understanding.

## 2016-01-16 HISTORY — PX: CATARACT EXTRACTION: SUR2

## 2016-02-25 ENCOUNTER — Other Ambulatory Visit: Payer: Self-pay | Admitting: Neurology

## 2016-02-25 DIAGNOSIS — G43101 Migraine with aura, not intractable, with status migrainosus: Secondary | ICD-10-CM

## 2016-02-25 DIAGNOSIS — R4189 Other symptoms and signs involving cognitive functions and awareness: Secondary | ICD-10-CM

## 2016-04-17 ENCOUNTER — Ambulatory Visit: Payer: Medicare HMO | Admitting: Adult Health

## 2016-04-24 ENCOUNTER — Ambulatory Visit (INDEPENDENT_AMBULATORY_CARE_PROVIDER_SITE_OTHER): Payer: Medicare HMO | Admitting: Adult Health

## 2016-04-24 ENCOUNTER — Encounter: Payer: Self-pay | Admitting: Adult Health

## 2016-04-24 VITALS — BP 149/88 | HR 103 | Ht 64.0 in | Wt 174.6 lb

## 2016-04-24 DIAGNOSIS — F09 Unspecified mental disorder due to known physiological condition: Secondary | ICD-10-CM | POA: Diagnosis not present

## 2016-04-24 DIAGNOSIS — G43009 Migraine without aura, not intractable, without status migrainosus: Secondary | ICD-10-CM | POA: Diagnosis not present

## 2016-04-24 MED ORDER — OXYCODONE-ACETAMINOPHEN 10-325 MG PO TABS: 1.0000 | ORAL_TABLET | Freq: Four times a day (QID) | ORAL | 0 refills | Status: DC | PRN

## 2016-04-24 NOTE — Patient Instructions (Signed)
Continue Amerge. Only use Oxycodone for migraines not relieved by [place] good Sleep Hygiene as discussed in the office visit If your symptoms worsen or you develop new symptoms please let us know.

## 2016-04-24 NOTE — Progress Notes (Signed)
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 She returns today for follow-up. She reports that her headaches are better. She states that she did have her ear piercing that seems to have reduced her headaches. She reports that she has on average 5 headaches a month. Usually with her headache she will begin by taking Amerge however that does not beneficial she will use oxycodone. In the past she was getting Botox therapy at [facility] but since her headaches have improved she has suspended this therapy. The patient reports that her memory remains the same. She continues to be forgetful. She is able to complete all ADLs independently. She doe operate a motor vehicle but she has had occasions where she is unable to remember the directions. She states that usually she can pull over and if she thinks long enough she is able to recall it. She is having trouble managing her finances often forgetting payments. Her husband does all the cooking. She continues to take Remeron at bedtime. She does report that it often takes 1-2 hours before she falls asleep. She does drink caffeine after lunch time. She also watches TV before bedtime. She continues on Aricept for her memory. She returns today for an evaluation.  HISTORY 10/15/15: Lauren Fuller is a 61 year old female with a history of cognitive dysfunction after chemotherapy for breast cancer treatment and migraine headaches. She returns today for follow-up. She remains on Aricept and is tolerating it well. She continues to notice changes with her memory. She states that she can be in mid conversation and lose her train of thought. She states that if she thinks about it long enough she will be able to recall her thoughts. She states that she operates a motor vehicle but  does occasionally get lost. Again she is able to recall the correct directions. She prepares all meals without difficulty. Able to complete all ADLs independently. She is on Remeron at bedtime. She states that she continues to have a hard time falling asleep. Once asleep she may get up 2-3 times a night usually to urinate. Her migraine headaches are stable. She reports that she has approximately one headache a week. She continues to use Amerge with good benefit. She returns today for an evaluation.  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 In the past her Mini-Mental status examinations showed scores as high as 29 out of 30 points. But the patient still was concerned about her fatigability, her ability to sustain cognitive function over the length of the work day. He also had significant headaches which led to further impairment.  We performed a Montral cognitive assessment test in July 2016 with the patient and she scored a remarkable 28 out of 30 points I do think that she is 90% recovered from her impairment previously for example she was able to recall 3 out of 5 words these 2 missed words of the only points she missed on the [facility] test she was able to draw a three-dimensional cube a clock face perform a trail making test name subtract and spell. She is fully oriented to time and place.  Word fluency was 17 words beginning with the letter F. She has been followed for headache treatments at the [facility] the Botox has helped her a lot and she is been now on Botox treatments for about 3 years.  Interval history from 04/05/2015. I follow Lauren Fuller today for her history of brain organic syndrome this cognitive problems. My last visit 6 months ago revealed no  cognitive impairment anymore. Today again she scored 28 out of 30 points on the Montral cognitive assessment test word fluency was 18. Clock face was full, she only missed 1 on the correct date and one point on delayed recall.  HPI: Has been an established patient of our practice. In 2007 she was diagnosed with breast cancer. This diagnosis was followed by radiation and chemotherapy and soon afterwards, following double mastectomies , she developed cognitive impairment.She had regular and repeated memory tests here at the office. The patient had seen Dr. Valentina Shaggy , neuropsychologist, who found that the patient would be long-term disabled due to her cognitive impairment easy distractibility and tangential thinking. Also multitasking difficulties remained evident. Interesting is that the patient had examinations liver not showing that severe of an impairment, but she had frequent memory problems, would forget which road exit she was supposed to take . Forgot passwords for the computer and her family had noticed significant executive and attention problems. Trial of Adderall have not made that much difference for her . Sleep testing showed very mild apnea. Migraines became almost disabling for her at the same time.  The use of zonisamide managed to treat her headaches. She did develop some insomnia and responded to Botox treatments at [facility] .She did not take frequent naps. From 2012 on and finally in March 2014 some prolonged relief from migraines with Botox treatments at [facility]. The patient used to work as an Interior and spatial designer.  Mini-Mental status exam revealed 29 of 30 points only one of the 5 recall words was missed. This is the same as she scored in November 2013 March 2013., In September 2012. Clock drawing and animal fluency testing were also unremarkable. In her review of systems she endorsed olfactory triggers for headaches as well as weather changes it seems that  drain, higher air pressure and strong odors or sense can cause her headaches. 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  Dr. Valentina Shaggy has seen the patient and found her inattentive and unable to sustain and maintain a thought process.    REVIEW OF SYSTEMS: Out of a complete 14 system review of symptoms, the patient complains only of the following symptoms, and all other reviewed systems are negative.  Memory loss, headache, decreased concentration, nervous/anxious, back pain, environmental allergies, apnea, frequent waking, snoring, runny nose  ALLERGIES: Allergies  Allergen Reactions  . Aspirin-Acetaminophen-Caffeine Anaphylaxis    Excedrin - jittery excedrin REACTION: makes me really nervous inside  . Butalbital-Aspirin-Caffeine     dizzy  . Butalbital-Aspirin-Caffeine     dizzy  . Codeine Itching    REACTION: itching  . Cyclosporine Other (See Comments) and Itching  . Fiorinal P-F W/Codeine  [Butalbital-Asa-Caff-Codeine] Other (See Comments)    FLORINAL 3.- severe sedation  . Hydromorphone Hcl     REACTION: not effective  . Pentazocine Lactate     REACTION: hallucinations  . Pentazocine Lactate     Makes pt feel drunk  . Pentazocine Anxiety  hallucinations    HOME MEDICATIONS: Outpatient Medications Prior to Visit  Medication Sig Dispense Refill  . ascorbic acid (VITAMIN C) 1000 MG tablet 1,000 mg.    . atorvastatin (LIPITOR) 40 MG tablet TAKE 1 TABLET DAILY 90 tablet 3  . donepezil (ARICEPT) 5 MG tablet TAKE 1 TABLET (5 MG TOTAL) BY MOUTH DAILY. 90 tablet 2  . Flaxseed, Linseed, (FLAXSEED OIL) 1000 MG CAPS Take by mouth daily.    . Folic Acid-Vit I6-NGE X52 (B COMPLEX-FOLIC ACID) 841-3-244 MCG-MG-MCG TABS Take by mouth.    . meloxicam (MOBIC) 7.5 MG tablet Take 7.5 mg by mouth daily.      . methocarbamol (ROBAXIN) 500 MG tablet Take 500 mg by mouth every 6 (six) hours as needed for  muscle spasms (Every 6-8 hours as needed).     . mirtazapine (REMERON SOL-TAB) 30 MG disintegrating tablet DISSOLVE ONE TABLET IN MOUTH AT BEDTIME 90 tablet 1  . Multiple Vitamin (MULTIVITAMIN) capsule Take 1 capsule by mouth daily.    . naratriptan (AMERGE) 2.5 MG tablet Take 1 tablet (2.5 mg total) by mouth as needed. Take one (1) tablet at onset of headache; if returns or does not resolve, may repeat after 4 hours; do not exceed five (5) mg in 24 hours. 30 tablet 1  . Omega-3 Fatty Acids (FISH OIL) 1000 MG CAPS 1 capsule.    Marland Kitchen omeprazole-sodium bicarbonate (ZEGERID) 40-1100 MG per capsule Take 1 capsule by mouth 2 (two) times daily.      Marland Kitchen oxyCODONE-acetaminophen (PERCOCET) 10-325 MG tablet Take 1 tablet by mouth every 6 (six) hours as needed for pain. 30 tablet 0  . Prenatal w/o A Vit-Fe Fum-FA (BP MULTINATAL PLUS) 30-1 MG TABS Take by mouth.    . venlafaxine (EFFEXOR) 75 MG tablet Take 75 mg by mouth daily. Take 3 tablets by mouth daily    . VITAMIN D, CHOLECALCIFEROL, PO Take by mouth.    . Calcium Carbonate-Vitamin D (CALCIUM 600-D) 600-400 MG-UNIT per tablet 1 tablet.    . fenofibrate (TRICOR) 145 MG tablet 145 mg.     No facility-administered medications prior to visit.     PAST MEDICAL HISTORY: Past Medical History:  Diagnosis Date  . ALLERGIC RHINITIS 10/27/2006  . Anemia   . BREAST CANCER, HX OF 10/27/2006  . Clotting disorder ([place])   . GERD 10/27/2006  . HYPERLIPIDEMIA 10/27/2006  . HYPERTENSION 10/27/2006  . Memory loss   . Migraines   . Organic brain syndrome   . PULMONARY EMBOLISM, HX OF 10/27/2006  . RENAL INSUFFICIENCY 08/01/2009    PAST SURGICAL HISTORY: Past Surgical History:  Procedure Laterality Date  . ABDOMINAL HYSTERECTOMY    . BREAST SURGERY     mastectomy  . CATARACT EXTRACTION Right 01/2016  . CESAREAN SECTION    . CHOLECYSTECTOMY    . KNEE SURGERY     left  . NISSEN FUNDOPLICATION    . TUBAL LIGATION    . VOCAL CORD INJECTION      FAMILY  HISTORY: Family History  Problem Relation Age of Onset  . Heart disease Mother   . Heart disease Father   . Cancer Maternal Uncle     leukemia  . Cancer Sister     lung    SOCIAL HISTORY: Social History   Social History  . Marital status: Married    Spouse name: Konrad Dolores  . Number of children: 2  . Years of education: BS   Occupational History  .  [employer]   Social History Main Topics  . Smoking status: Never Smoker  . Smokeless tobacco: Never Used  . Alcohol use No  . Drug use: No  . Sexual activity: Not on file   Other Topics Concern  . 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   Patient has two children.   Patient is disabled.   Patient drinks caffeine three to four times per week.   Patient is right-handed.   Patient has a Financial planner.      PHYSICAL EXAM  Vitals:   04/24/16 1249  BP: (!) 149/88  Pulse: (!) 103  Weight: 174 lb 9.6 oz (79.2 kg)  Height: '5\' 4"'  (1.626 m)   Body mass index is 29.97 kg/m.  Montreal Cognitive Assessment  04/24/2016 10/15/2015 04/05/2015  Visuospatial/ Executive (0/5) '5 5 5  ' Naming (0/3) '3 3 3  ' Attention: Read list of digits (0/2) '2 1 2  ' Attention: Read list of letters (0/1) '1 1 1  ' Attention: Serial 7 subtraction starting at 100 (0/3) '2 3 3  ' Language: Repeat phrase (0/2) '2 2 2  ' Language : Fluency (0/1) '1 1 1  ' Abstraction (0/2) '2 2 2  ' Delayed Recall (0/5) '4 3 4  ' Orientation (0/6) '6 6 5  ' Total '28 27 28  ' Adjusted Score (based on education) - - 28     Generalized: Well developed, in no acute distress   Neurological examination  Mentation: Alert oriented to time, place, history taking. Follows all commands speech and language fluent Cranial nerve II-XII: Pupils were equal round reactive to light. Extraocular movements were full, visual field were full on confrontational test. Facial sensation and strength were normal. Uvula tongue  midline. Head turning and shoulder shrug  were normal and symmetric. Motor: The motor testing reveals 5 over 5 strength of all 4 extremities. Good symmetric motor tone is noted throughout.  Sensory: Sensory testing is intact to soft touch on all 4 extremities. No evidence of extinction is noted.  Coordination: Cerebellar testing reveals good finger-nose-finger and heel-to-shin bilaterally.  Gait and station: Gait is normal. Tandem gait is normal. Romberg is negative. No drift is seen.  Reflexes: Deep tendon reflexes are symmetric and normal bilaterally.   DIAGNOSTIC DATA (LABS, IMAGING, TESTING) - I reviewed patient records, labs, notes, testing and imaging myself where available.  Lab Results  Component Value Date   WBC 7.2 04/14/2013   HGB 12.7 04/14/2013   HCT 37.0 04/14/2013   MCV 91.3 04/14/2013   PLT 312 04/14/2013      Component Value Date/Time   NA 141 04/14/2013 0951   K 4.0 04/14/2013 0951   CL 106 04/21/2011 1042   CO2 28 04/14/2013 0951   GLUCOSE 101 04/14/2013 0951   BUN 23.6 04/14/2013 0951   CREATININE 0.8 04/14/2013 0951   CALCIUM 9.5 04/14/2013 0951   PROT 6.7 04/14/2013 0951   ALBUMIN 4.0 04/14/2013 0951   AST 18 04/14/2013 0951   ALT 24 04/14/2013 0951   ALKPHOS 60 04/14/2013 0951   BILITOT 0.34 04/14/2013 0951   GFRNONAA 77.42 08/01/2009 0845   GFRAA  12/23/2006 0505    >60        The eGFR has been calculated using the MDRD equation. This calculation has not been validated in all clinical        ASSESSMENT AND PLAN 61 y.o. year old female  has a past medical history  of ALLERGIC RHINITIS (10/27/2006); Anemia; BREAST CANCER, HX OF (10/27/2006); Clotting disorder ([place]); GERD (10/27/2006); HYPERLIPIDEMIA (10/27/2006); HYPERTENSION (10/27/2006); Memory loss; Migraines; Organic brain syndrome; PULMONARY EMBOLISM, HX OF (10/27/2006); and RENAL INSUFFICIENCY (08/01/2009). here with:  1.Cognitive dysfunction 2. Migraine headaches  The patient's memory score  has remained stable. She will continue on Aricept. She will continue using Amerge treat her headaches. She is advised that she should only use oxycodone when her headaches do not respond to [place]. If the patient's migraine frequency increases she may need to reconsider Botox therapy. Patient voiced understanding. She will follow-up in 6 months or sooner if needed.  Patient reports that her psychiatrist is leaving. She is currently trying to get set up with a new psychiatrist. She is asking if we will prescribed Effexor if she were to run out. I advised that we wil provide her with one prescription if needed. She should try to set up an appointment with a new psychiatrist within the next 3-4 months.   [place] Drug registry was checked. The patient is not receiving any other opioid medication at this time.   Ward Givens, MSN, NP-C 04/24/2016, 1:08 PM [facility] [address] [place], [place] [address] [phone]

## 2016-04-25 NOTE — Progress Notes (Signed)
I agree with the assessment and plan as directed by NP .The patient is known to me .   Vallery Mcdade, MD  

## 2016-06-07 ENCOUNTER — Other Ambulatory Visit: Payer: Self-pay | Admitting: Neurology

## 2016-06-25 ENCOUNTER — Telehealth: Payer: Self-pay | Admitting: Neurology

## 2016-06-25 DIAGNOSIS — F339 Major depressive disorder, recurrent, unspecified: Secondary | ICD-10-CM

## 2016-06-25 NOTE — Telephone Encounter (Signed)
Patient requesting new Rx for venlafaxine (EFFEXOR) 75 MG tablet called to [facility] at [address].  She previously got medication from Dr. Caprice Beaver a psychiatrist but he is no longer in practice. Is there another psychiatrist she can be referred to?  Please call and advise.

## 2016-06-26 MED ORDER — VENLAFAXINE HCL 75 MG PO TABS: ORAL_TABLET | ORAL | 3 refills | Status: DC

## 2016-06-26 NOTE — Telephone Encounter (Signed)
Do you feel comfortable refilling this?   And can we put in a new psychiatrist referral?

## 2016-06-26 NOTE — Telephone Encounter (Signed)
  I will refill Effexor for Mrs. Lauren Fuller is abrupt discontinuation of this medication causes severe withdrawal effect. I would also be happy to refer her to a new psychiatrist, I recommend Dr. Casimiro Needle

## 2016-06-26 NOTE — Addendum Note (Signed)
Addended by: Larey Seat on: 06/26/2016 05:16 PM   Modules accepted: Orders

## 2016-07-01 MED ORDER — VENLAFAXINE HCL ER 75 MG PO CP24: 225.0000 mg | ORAL_CAPSULE | Freq: Every day | ORAL | 0 refills | Status: DC

## 2016-07-01 NOTE — Telephone Encounter (Signed)
[facility] said the pt takes [facility]R, last refill 4/11 is for plain tabs. I relayed new RX was sent today 4/17 for XR, said she has not rec'd it. Call was transferred to [place]

## 2016-07-01 NOTE — Addendum Note (Signed)
Addended by: Laurence Spates on: 07/01/2016 02:50 PM   Modules accepted: Orders

## 2016-07-01 NOTE — Telephone Encounter (Signed)
I spoke to patient and she is aware of new referral. Patient wanted to clarify that she take Venlafaxine XR 75mg  caps, 3 a day. New Rx sent in.

## 2016-07-01 NOTE — Telephone Encounter (Signed)
I spoke to pharmacy and verified rx.

## 2016-07-03 ENCOUNTER — Telehealth: Payer: Self-pay

## 2016-07-03 NOTE — Telephone Encounter (Signed)
75 mg approved through 03/16/2017. Information sent to pharmacy

## 2016-07-03 NOTE — Telephone Encounter (Signed)
PA request faxed to [place], submitted on CoverMyMeds. Key: [id number]

## 2016-07-03 NOTE — Telephone Encounter (Signed)
PA approved 03/17/16-03-16-2017. I have faxed information to pharmacy

## 2016-07-03 NOTE — Telephone Encounter (Signed)
Insurance approved for 37.5mg , it is supposed to be 75mg . I am going back on CoverMyMeds to see if they can fix that. New request sent in

## 2016-07-07 ENCOUNTER — Telehealth: Payer: Self-pay

## 2016-07-07 NOTE — Telephone Encounter (Signed)
Received refill request for Naratriptan

## 2016-07-08 MED ORDER — NARATRIPTAN HCL 2.5 MG PO TABS: 2.5000 mg | ORAL_TABLET | ORAL | 1 refills | Status: DC | PRN

## 2016-07-08 NOTE — Telephone Encounter (Signed)
Rx signed and faxed in

## 2016-07-08 NOTE — Telephone Encounter (Signed)
Printed and put on Dr. Rhea Belton (on-call doctor) schedule for signature

## 2016-10-23 ENCOUNTER — Encounter: Payer: Self-pay | Admitting: Adult Health

## 2016-10-23 ENCOUNTER — Encounter (INDEPENDENT_AMBULATORY_CARE_PROVIDER_SITE_OTHER): Payer: Self-pay

## 2016-10-23 ENCOUNTER — Ambulatory Visit (INDEPENDENT_AMBULATORY_CARE_PROVIDER_SITE_OTHER): Payer: Medicare HMO | Admitting: Adult Health

## 2016-10-23 VITALS — BP 141/80 | HR 95 | Ht 64.5 in | Wt 172.0 lb

## 2016-10-23 DIAGNOSIS — F09 Unspecified mental disorder due to known physiological condition: Secondary | ICD-10-CM

## 2016-10-23 DIAGNOSIS — Z5181 Encounter for therapeutic drug level monitoring: Secondary | ICD-10-CM | POA: Diagnosis not present

## 2016-10-23 DIAGNOSIS — R252 Cramp and spasm: Secondary | ICD-10-CM | POA: Diagnosis not present

## 2016-10-23 DIAGNOSIS — G43009 Migraine without aura, not intractable, without status migrainosus: Secondary | ICD-10-CM

## 2016-10-23 NOTE — Patient Instructions (Signed)
Continue Amerge  Oxycodone for severe headaches Continue Aricept for memory Blood work today for muscle cramps If your symptoms worsen or you develop new symptoms please let us know.

## 2016-10-23 NOTE — Progress Notes (Signed)
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 She returns today for follow-up. She reports overall she is doing well. She states that her headaches have remained stable. She has approximately 5 headaches a month. She typically uses Amerge for her headaches. If this does not resolve her headache she will use oxycodone. The patient feels that her memory has remained stable. She is able to complete all ADLs independently. 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 She remains on Aricept for her memory. She does note that she's been having cramps in her toes primarily at bedtime. She states that she does stay well-hydrated during the day. She has tried taking magnesium supplements with no benefit. She states that these occur primarily every night. She returns today for an evaluation.  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 She returns today for follow-up. She reports that her headaches are better. She states that she did have her ear piercing that seems to have reduced her headaches. She reports that she has on average 5 headaches a month. Usually with her headache she will begin by taking Amerge however that does not beneficial she will use oxycodone. In the past she was getting B[facility] therapy at [facility] but since her headaches have improved she has suspended this therapy. The patient reports that her memory remains the same. She continues to be forgetful. She is able to complete all ADLs independently. She doe operate a motor vehicle but she has had occasions where she is unable to remember the directions. She states that usually she can  pull over and if she thinks long enough she is able to recall it. She is having trouble managing her finances often forgetting payments. Her husband does all the cooking. She continues to take Remeron at bedtime. She does report that it often takes 1-2 hours before she falls asleep. She does drink caffeine after lunch time. She also watches TV before bedtime. She continues on Aricept for her memory. She returns today for an evaluation.  REVIEW OF SYSTEMS: Out of a complete 14 system review of symptoms, the patient complains only of the following symptoms, and all other reviewed systems are negative.  Frequency of urination, urgency, muscle cramps, memory loss, headache, runny nose  ALLERGIES: Allergies  Allergen Reactions  . Aspirin-Acetaminophen-Caffeine Anaphylaxis    Excedrin - jittery excedrin REACTION: makes me really nervous inside  . Butalbital-Aspirin-Caffeine     dizzy  . Butalbital-Aspirin-Caffeine     dizzy  . Codeine Itching    REACTION: itching  . Cyclosporine Other (See Comments) and Itching  . Fiorinal P-F W/Codeine  [Butalbital-Asa-Caff-Codeine] Other (See Comments)    FLORINAL 3.- severe sedation  . Hydromorphone Hcl     REACTION: not effective  . Pentazocine Lactate     REACTION: hallucinations  . Pentazocine Lactate     Makes pt feel drunk  . Pentazocine Anxiety    hallucinations    HOME MEDICATIONS: Outpatient Medications Prior to Visit  Medication Sig Dispense Refill  . ascorbic acid (VITAMIN C) 1000 MG tablet 1,000 mg.    . atorvastatin (LIPITOR) 40 MG tablet TAKE 1 TABLET DAILY 90 tablet 3  . Calcium Carbonate-Vitamin D (CALCIUM 600-D) 600-400  MG-UNIT per tablet 1 tablet.    . donepezil (ARICEPT) 5 MG tablet TAKE 1 TABLET (5 MG TOTAL) BY MOUTH DAILY. 90 tablet 2  . fenofibrate (TRICOR) 145 MG tablet 145 mg.    . Flaxseed, Linseed, (FLAXSEED OIL) 1000 MG CAPS Take by mouth daily.    . Folic Acid-Vit K0-XFG H82 (B COMPLEX-FOLIC ACID) 993-7-169 MCG-MG-MCG  TABS Take by mouth.    . meloxicam (MOBIC) 7.5 MG tablet Take 7.5 mg by mouth daily.      . methocarbamol (ROBAXIN) 500 MG tablet Take 500 mg by mouth every 6 (six) hours as needed for muscle spasms (Every 6-8 hours as needed).     . mirtazapine (REMERON SOL-TAB) 30 MG disintegrating tablet DISSOLVE ONE TABLET IN MOUTH AT BEDTIME 90 tablet 1  . Multiple Vitamin (MULTIVITAMIN) capsule Take 1 capsule by mouth daily.    . naratriptan (AMERGE) 2.5 MG tablet Take 1 tablet (2.5 mg total) by mouth as needed. Take one (1) tablet at onset of headache; if returns or does not resolve, may repeat after 4 hours; do not exceed five (5) mg in 24 hours. 30 tablet 1  . Omega-3 Fatty Acids (FISH OIL) 1000 MG CAPS 1 capsule.    Marland Kitchen omeprazole-sodium bicarbonate (ZEGERID) 40-1100 MG per capsule Take 1 capsule by mouth 2 (two) times daily.      Marland Kitchen oxyCODONE-acetaminophen (PERCOCET) 10-325 MG tablet Take 1 tablet by mouth every 6 (six) hours as needed for pain. 30 tablet 0  . Prenatal w/o A Vit-Fe Fum-FA (BP MULTINATAL PLUS) 30-1 MG TABS Take by mouth.    . venlafaxine XR (EFFEXOR XR) 75 MG 24 hr capsule Take 3 capsules (225 mg total) by mouth daily with breakfast. 270 capsule 0  . VITAMIN D, CHOLECALCIFEROL, PO Take by mouth.     No facility-administered medications prior to visit.     PAST MEDICAL HISTORY: Past Medical History:  Diagnosis Date  . ALLERGIC RHINITIS 10/27/2006  . Anemia   . BREAST CANCER, HX OF 10/27/2006  . Clotting disorder ([place])   . GERD 10/27/2006  . HYPERLIPIDEMIA 10/27/2006  . HYPERTENSION 10/27/2006  . Memory loss   . Migraines   . Organic brain syndrome   . PULMONARY EMBOLISM, HX OF 10/27/2006  . RENAL INSUFFICIENCY 08/01/2009    PAST SURGICAL HISTORY: Past Surgical History:  Procedure Laterality Date  . ABDOMINAL HYSTERECTOMY    . BREAST SURGERY     mastectomy  . CATARACT EXTRACTION Right 01/2016  . CESAREAN SECTION    . CHOLECYSTECTOMY    . KNEE SURGERY     left  . NISSEN  FUNDOPLICATION    . TUBAL LIGATION    . VOCAL CORD INJECTION      FAMILY HISTORY: Family History  Problem Relation Age of Onset  . Heart disease Mother   . Heart disease Father   . Cancer Maternal Uncle        leukemia  . Cancer Sister        lung    SOCIAL HISTORY: Social History   Social History  . Marital status: Married    Spouse name: Konrad Dolores  . Number of children: 2  . Years of education: BS   Occupational History  .  [employer]   Social History Main Topics  . Smoking status: Never Smoker  . Smokeless tobacco: Never Used  . Alcohol use No  . Drug use: No  . Sexual activity: Not on file   Other Topics Concern  .  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   Patient has two children.   Patient is disabled.   Patient drinks caffeine three to four times per week.   Patient is right-handed.   Patient has a Financial planner.      PHYSICAL EXAM  Vitals:   10/23/16 1413  BP: (!) 141/80  Pulse: 95  Weight: 172 lb (78 kg)  Height: 5' 4.5" (1.638 m)   Body mass index is 29.07 kg/m.  Generalized: Well developed, in no acute distress   Neurological examination  Mentation: Alert oriented to time, place, history taking. Follows all commands speech and language fluent Cranial nerve II-XII: Pupils were equal round reactive to light. Extraocular movements were full, visual field were full on confrontational test. Facial sensation and strength were normal. Uvula tongue midline. Head turning and shoulder shrug  were normal and symmetric. Motor: The motor testing reveals 5 over 5 strength of all 4 extremities. Good symmetric motor tone is noted throughout.  Sensory: Sensory testing is intact to soft touch on all 4 extremities. No evidence of extinction is noted.  Coordination: Cerebellar testing reveals good finger-nose-finger and heel-to-shin bilaterally.  Gait and station: Gait is  normal. Tandem gait is normal. Romberg is negative. No drift is seen.  Reflexes: Deep tendon reflexes are symmetric and normal bilaterally.   DIAGNOSTIC DATA (LABS, IMAGING, TESTING) - I reviewed patient records, labs, notes, testing and imaging myself where available.  Lab Results  Component Value Date   WBC 7.2 04/14/2013   HGB 12.7 04/14/2013   HCT 37.0 04/14/2013   MCV 91.3 04/14/2013   PLT 312 04/14/2013      Component Value Date/Time   NA 141 04/14/2013 0951   K 4.0 04/14/2013 0951   CL 106 04/21/2011 1042   CO2 28 04/14/2013 0951   GLUCOSE 101 04/14/2013 0951   BUN 23.6 04/14/2013 0951   CREATININE 0.8 04/14/2013 0951   CALCIUM 9.5 04/14/2013 0951   PROT 6.7 04/14/2013 0951   ALBUMIN 4.0 04/14/2013 0951   AST 18 04/14/2013 0951   ALT 24 04/14/2013 0951   ALKPHOS 60 04/14/2013 0951   BILITOT 0.34 04/14/2013 0951   GFRNONAA 77.42 08/01/2009 0845   GFRAA  12/23/2006 0505    >60        The eGFR has been calculated using the MDRD equation. This calculation has not been validated in all clinical   Lab Results  Component Value Date   CHOL 168 02/20/2011   HDL 57.30 02/20/2011   LDLCALC 79 02/20/2011   LDLDIRECT 105.9 07/09/2006   TRIG 161.0 (H) 02/20/2011   CHOLHDL 3 02/20/2011   No results found for: HGBA1C Lab Results  Component Value Date   VITAMINB12 415 05/18/2008   Lab Results  Component Value Date   TSH 1.339 10/24/2010      ASSESSMENT AND PLAN 61 y.o. year old female  has a past medical history of ALLERGIC RHINITIS (10/27/2006); Anemia; BREAST CANCER, HX OF (10/27/2006); Clotting disorder ([place]); GERD (10/27/2006); HYPERLIPIDEMIA (10/27/2006); HYPERTENSION (10/27/2006); Memory loss; Migraines; Organic brain syndrome; PULMONARY EMBOLISM, HX OF (10/27/2006); and RENAL INSUFFICIENCY (08/01/2009). here with:  1. Migraine headaches 2. Cognitive dysfunction 3. Muscle cramps  Overall the patient is doing well. She will continue on Amerge for treatment of her  headache. She will use oxycodone only for severe headaches. The patient has not noted any changes in her memory. We  will continue to monitor. For now she will remain on Aricept 5 mg daily. The patient is reporting new symptom of muscle cramps. I will check some blood work today. Patient advised that if her symptoms worsen or she develops new symptoms she should let us know. She will follow-up in 6 months or sooner if needed.    Ward Givens, MSN, NP-C 10/23/2016, 1:39 PM [facility] [address] [place], [place] [address] [phone]

## 2016-10-24 LAB — COMPREHENSIVE METABOLIC PANEL
A/G RATIO: 1.8 (ref 1.2–2.2)
ALBUMIN: 4.5 g/dL (ref 3.6–4.8)
ALT: 34 IU/L — ABNORMAL HIGH (ref 0–32)
AST: 26 IU/L (ref 0–40)
Alkaline Phosphatase: 68 IU/L (ref 39–117)
BUN / CREAT RATIO: 25 (ref 12–28)
BUN: 21 mg/dL (ref 8–27)
Bilirubin Total: <0.2 mg/dL (ref 0.0–1.2)
CALCIUM: 9.6 mg/dL (ref 8.7–10.3)
CO2: 23 mmol/L (ref 20–29)
CREATININE: 0.85 mg/dL (ref 0.57–1.00)
Chloride: 102 mmol/L (ref 96–106)
GFR calc Af Amer: 86 mL/min/{1.73_m2} (ref >59)
GFR, EST NON AFRICAN AMERICAN: 75 mL/min/{1.73_m2} (ref >59)
GLOBULIN, TOTAL: 2.5 g/dL (ref 1.5–4.5)
Glucose: 90 mg/dL (ref 65–99)
POTASSIUM: 4.2 mmol/L (ref 3.5–5.2)
SODIUM: 141 mmol/L (ref 134–144)
Total Protein: 7 g/dL (ref 6.0–8.5)

## 2016-10-24 LAB — MAGNESIUM: Magnesium: 1.8 mg/dL (ref 1.6–2.3)

## 2016-10-24 LAB — CBC WITH DIFFERENTIAL/PLATELET
Basophils Absolute: 0 10*3/uL (ref 0.0–0.2)
Basos: 0 %
EOS (ABSOLUTE): 0.1 10*3/uL (ref 0.0–0.4)
Eos: 2 %
HEMATOCRIT: 36.7 % (ref 34.0–46.6)
HEMOGLOBIN: 12.2 g/dL (ref 11.1–15.9)
Immature Grans (Abs): 0 10*3/uL (ref 0.0–0.1)
Immature Granulocytes: 0 %
LYMPHS ABS: 3.1 10*3/uL (ref 0.7–3.1)
Lymphs: 37 %
MCH: 29.8 pg (ref 26.6–33.0)
MCHC: 33.2 g/dL (ref 31.5–35.7)
MCV: 90 fL (ref 79–97)
MONOS ABS: 0.7 10*3/uL (ref 0.1–0.9)
Monocytes: 9 %
NEUTROS PCT: 52 %
Neutrophils Absolute: 4.3 10*3/uL (ref 1.4–7.0)
Platelets: 402 10*3/uL — ABNORMAL HIGH (ref 150–379)
RBC: 4.1 x10E6/uL (ref 3.77–5.28)
RDW: 13.7 % (ref 12.3–15.4)
WBC: 8.2 10*3/uL (ref 3.4–10.8)

## 2016-10-25 NOTE — Progress Notes (Signed)
I agree with the assessment and plan as directed by NP .The patient is known to me .   Orland Visconti, MD  

## 2016-10-27 ENCOUNTER — Telehealth: Payer: Self-pay | Admitting: *Deleted

## 2016-10-27 NOTE — Telephone Encounter (Signed)
Spoke to pt and relayed that her labs were relatively unremarkable.  Platelets and ALT slightly elevated.  Will continue to monitor.  Nothing to be done at this time.  Will forward to her pcp, Dr. Melford Aase.  She verbalized understanding.

## 2016-10-27 NOTE — Telephone Encounter (Signed)
-----   Message from Ward Givens, NP sent at 10/27/2016  8:10 AM EDT ----- Labwork relatively unremarkable. Platelets slightly elevated as well as ALT. We will continue to monitor. Please send a copy of her labwork to her primary care provider.

## 2016-10-27 NOTE — Telephone Encounter (Signed)
Fax confirmation received [phone] (lab results to pcp).

## 2016-11-16 ENCOUNTER — Other Ambulatory Visit: Payer: Self-pay | Admitting: Neurology

## 2016-11-16 DIAGNOSIS — G43101 Migraine with aura, not intractable, with status migrainosus: Secondary | ICD-10-CM

## 2016-11-16 DIAGNOSIS — R4189 Other symptoms and signs involving cognitive functions and awareness: Secondary | ICD-10-CM

## 2016-12-13 ENCOUNTER — Other Ambulatory Visit: Payer: Self-pay | Admitting: Neurology

## 2017-02-09 ENCOUNTER — Other Ambulatory Visit: Payer: Self-pay | Admitting: Neurology

## 2017-04-27 ENCOUNTER — Ambulatory Visit: Payer: Medicare HMO | Admitting: Neurology

## 2017-04-28 ENCOUNTER — Ambulatory Visit (INDEPENDENT_AMBULATORY_CARE_PROVIDER_SITE_OTHER): Payer: Medicare HMO | Admitting: Adult Health

## 2017-04-28 ENCOUNTER — Encounter: Payer: Self-pay | Admitting: Adult Health

## 2017-04-28 VITALS — BP 134/79 | HR 97 | Ht 64.5 in | Wt 170.6 lb

## 2017-04-28 DIAGNOSIS — G43101 Migraine with aura, not intractable, with status migrainosus: Secondary | ICD-10-CM | POA: Diagnosis not present

## 2017-04-28 DIAGNOSIS — R4189 Other symptoms and signs involving cognitive functions and awareness: Secondary | ICD-10-CM

## 2017-04-28 MED ORDER — NARATRIPTAN HCL 2.5 MG PO TABS: ORAL_TABLET | ORAL | 1 refills | Status: DC

## 2017-04-28 MED ORDER — DONEPEZIL HCL 10 MG PO TABS: 10.0000 mg | ORAL_TABLET | Freq: Every day | ORAL | 3 refills | Status: DC

## 2017-04-28 MED ORDER — OXYCODONE-ACETAMINOPHEN 10-325 MG PO TABS: 1.0000 | ORAL_TABLET | Freq: Every day | ORAL | 0 refills | Status: DC | PRN

## 2017-04-28 NOTE — Progress Notes (Signed)
I have read the note, and I agree with the clinical assessment and plan.  Charles K Willis   

## 2017-04-28 NOTE — Patient Instructions (Signed)
Your Plan:  Continue using Amerge if needed. Only take oxycodone for severe headaches that does not respond to Amerge Increase Aricept to 10 mg daily If your symptoms worsen or you develop new symptoms please let us know.   Thank you for coming to see [place] at [facility]. I hope we have been able to provide you high quality care today.  You may receive a patient satisfaction survey over the next few weeks. We would appreciate your feedback and comments so that we may continue to improve ourselves and the health of our patients.

## 2017-04-28 NOTE — Progress Notes (Signed)
PATIENT: Lauren Fuller DOB: [birth year]  REASON FOR VISIT: follow up HISTORY FROM: patient  HISTORY OF PRESENT ILLNESS: Today 04/28/17 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  She returns today for follow-up.  She reports that her headaches remain under good control.  She has approximately 5 headaches a month.  She typically can get relief with Amerge but if she has a severe headache that does not respond she will use oxycodone.  She states typically her headaches occur on the right side she does have photophobia but denies nausea and vomiting.  She feels that her cognition is slightly worse.  She states she has more of a concentration and focus issue.  In the past she has been on Adderall with no noticeable benefit.  She is able to complete all ADLs independently.  She denies any issues driving.  She manages her own finances without difficulty.  Denies any change in appetite.  She is able to prepare meals without difficulty.  She returns today for an evaluation.  HISTORY 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 She returns today for follow-up. She reports overall she is doing well. She states that her headaches have remained stable. She has approximately 5 headaches a month. She typically uses Amerge for her headaches. If this does not resolve her headache she will use oxycodone. The patient feels that her memory has remained stable. She is able to complete all ADLs independently. 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 She remains on Aricept for her memory. She does note that she's been having cramps in her toes primarily at bedtime. She states that she does stay well-hydrated during the day. She has tried taking magnesium supplements with no benefit. She states that these occur primarily every night.  She returns today for an evaluation.  REVIEW OF SYSTEMS: Out of a complete 14 system review of symptoms, the patient complains only of the following symptoms, and all other reviewed systems are negative.  See HPI  ALLERGIES: Allergies  Allergen Reactions  . Aspirin-Acetaminophen-Caffeine Anaphylaxis    Excedrin - jittery excedrin REACTION: makes me really nervous inside  . Butalbital-Aspirin-Caffeine     dizzy  . Butalbital-Aspirin-Caffeine     dizzy  . Codeine Itching    REACTION: itching  . Cyclosporine Other (See Comments) and Itching  . Fiorinal P-F W/Codeine  [Butalbital-Asa-Caff-Codeine] Other (See Comments)    FLORINAL 3.- severe sedation  . Hydromorphone Hcl     REACTION: not effective  . Pentazocine Lactate     REACTION: hallucinations  . Pentazocine Lactate     Makes pt feel drunk  . Pentazocine Anxiety    hallucinations    HOME MEDICATIONS: Outpatient Medications Prior to Visit  Medication Sig Dispense Refill  . ascorbic acid (VITAMIN C) 1000 MG tablet 1,000 mg.    . atorvastatin (LIPITOR) 40 MG tablet TAKE 1 TABLET DAILY 90 tablet 3  . Calcium Carbonate-Vitamin D (CALCIUM 600-D) 600-400 MG-UNIT per tablet 1 tablet.    . donepezil (ARICEPT) 5 MG tablet TAKE 1 TABLET (5 MG TOTAL) BY MOUTH DAILY. 90 tablet 2  . fenofibrate (TRICOR) 145 MG tablet 145 mg.    . Flaxseed, Linseed, (FLAXSEED OIL) 1000 MG CAPS Take by mouth daily.    . Folic Acid-Vit Z6-XWR U04 (B COMPLEX-FOLIC ACID) 540-9-811 MCG-MG-MCG TABS Take by mouth.    Marland Kitchen  meloxicam (MOBIC) 7.5 MG tablet Take 7.5 mg by mouth daily.      . methocarbamol (ROBAXIN) 500 MG tablet Take 500 mg by mouth every 6 (six) hours as needed for muscle spasms (Every 6-8 hours as needed).     . mirtazapine (REMERON SOL-TAB) 30 MG disintegrating tablet DISSOLVE ONE TABLET IN MOUTH AT BEDTIME 90 tablet 1  . Multiple Vitamin (MULTIVITAMIN) capsule Take 1 capsule by mouth daily.    . naratriptan (AMERGE) 2.5 MG tablet Take 1 tablet  (2.5 mg total) by mouth as needed. Take one (1) tablet at onset of headache; if returns or does not resolve, may repeat after 4 hours; do not exceed five (5) mg in 24 hours. 30 tablet 1  . Omega-3 Fatty Acids (FISH OIL) 1000 MG CAPS 1 capsule.    Marland Kitchen omeprazole (PRILOSEC) 40 MG capsule Take 40 mg by mouth daily.    Marland Kitchen oxyCODONE-acetaminophen (PERCOCET) 10-325 MG tablet Take 1 tablet by mouth every 6 (six) hours as needed for pain. 30 tablet 0  . Prenatal w/o A Vit-Fe Fum-FA (BP MULTINATAL PLUS) 30-1 MG TABS Take by mouth.    . sodium bicarbonate 650 MG tablet TAKE TWO TABLETS (1,300 MG TOTAL) BY MOUTH 2 (TWO) TIMES DAILY.  1  . venlafaxine XR (EFFEXOR XR) 75 MG 24 hr capsule Take 3 capsules (225 mg total) by mouth daily with breakfast. 270 capsule 0  . VITAMIN D, CHOLECALCIFEROL, PO Take by mouth.     No facility-administered medications prior to visit.     PAST MEDICAL HISTORY: Past Medical History:  Diagnosis Date  . ALLERGIC RHINITIS 10/27/2006  . Anemia   . BREAST CANCER, HX OF 10/27/2006  . Clotting disorder ([place])   . GERD 10/27/2006  . HYPERLIPIDEMIA 10/27/2006  . HYPERTENSION 10/27/2006  . Memory loss   . Migraines   . Organic brain syndrome   . PULMONARY EMBOLISM, HX OF 10/27/2006  . RENAL INSUFFICIENCY 08/01/2009    PAST SURGICAL HISTORY: Past Surgical History:  Procedure Laterality Date  . ABDOMINAL HYSTERECTOMY    . BREAST SURGERY     mastectomy  . CATARACT EXTRACTION Right 01/2016  . CESAREAN SECTION    . CHOLECYSTECTOMY    . KNEE SURGERY     left  . NISSEN FUNDOPLICATION    . TUBAL LIGATION    . VOCAL CORD INJECTION      FAMILY HISTORY: Family History  Problem Relation Age of Onset  . Heart disease Mother   . Heart disease Father   . Cancer Maternal Uncle        leukemia  . Cancer Sister        lung    SOCIAL HISTORY: Social History   Socioeconomic History  . Marital status: Married    Spouse name: Konrad Dolores  . Number of children: 2  . Years of education:  BS  . Highest education level: Not on file  Social Needs  . Financial resource strain: Not on file  . Food insecurity - worry: Not on file  . Food insecurity - inability: Not on file  . Transportation needs - medical: Not on file  . Transportation needs - non-medical: Not on file  Occupational History    Employer: [employer]  Tobacco Use  . Smoking status: Never Smoker  . Smokeless tobacco: Never Used  Substance and Sexual Activity  . Alcohol use: No  . Drug use: No  . Sexual activity: Not on file  Other Topics Concern  .  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   Patient has two children.   Patient is disabled.   Patient drinks caffeine three to four times per week.   Patient is right-handed.   Patient has a Financial planner.      PHYSICAL EXAM  Vitals:   04/28/17 1058  Height: 5' 4.5" (1.638 m)   Body mass index is 29.07 kg/m. Montreal Cognitive Assessment  04/28/2017 04/24/2016 10/15/2015 04/05/2015  Visuospatial/ Executive (0/5) 5 5 5 5   Naming (0/3) 3 3 3 3   Attention: Read list of digits (0/2) 1 2 1 2   Attention: Read list of letters (0/1) 1 1 1 1   Attention: Serial 7 subtraction starting at 100 (0/3) 3 2 3 3   Language: Repeat phrase (0/2) 2 2 2 2   Language : Fluency (0/1) 1 1 1 1   Abstraction (0/2) 2 2 2 2   Delayed Recall (0/5) 3 4 3 4   Orientation (0/6) 6 6 6 5   Total 27 28 27 28   Adjusted Score (based on education) - - - 28    Generalized: Well developed, in no acute distress   Neurological examination  Mentation: Alert oriented to time, place, history taking. Follows all commands speech and language fluent Cranial nerve II-XII: Pupils were equal round reactive to light. Extraocular movements were full, visual field were full on confrontational test. Facial sensation and strength were normal. Uvula tongue midline. Head turning and shoulder shrug  were normal and  symmetric. Motor: The motor testing reveals 5 over 5 strength of all 4 extremities. Good symmetric motor tone is noted throughout.  Sensory: Sensory testing is intact to soft touch on all 4 extremities. No evidence of extinction is noted.  Coordination: Cerebellar testing reveals good finger-nose-finger and heel-to-shin bilaterally.  Gait and station: Gait is normal.  Reflexes: Deep tendon reflexes are symmetric and normal bilaterally.   DIAGNOSTIC DATA (LABS, IMAGING, TESTING) - I reviewed patient records, labs, notes, testing and imaging myself where available.  Lab Results  Component Value Date   WBC 8.2 10/23/2016   HGB 12.2 10/23/2016   HCT 36.7 10/23/2016   MCV 90 10/23/2016   PLT 402 (H) 10/23/2016      Component Value Date/Time   NA 141 10/23/2016 1444   NA 141 04/14/2013 0951   K 4.2 10/23/2016 1444   K 4.0 04/14/2013 0951   CL 102 10/23/2016 1444   CO2 23 10/23/2016 1444   CO2 28 04/14/2013 0951   GLUCOSE 90 10/23/2016 1444   GLUCOSE 101 04/14/2013 0951   BUN 21 10/23/2016 1444   BUN 23.6 04/14/2013 0951   CREATININE 0.85 10/23/2016 1444   CREATININE 0.8 04/14/2013 0951   CALCIUM 9.6 10/23/2016 1444   CALCIUM 9.5 04/14/2013 0951   PROT 7.0 10/23/2016 1444   PROT 6.7 04/14/2013 0951   ALBUMIN 4.5 10/23/2016 1444   ALBUMIN 4.0 04/14/2013 0951   AST 26 10/23/2016 1444   AST 18 04/14/2013 0951   ALT 34 (H) 10/23/2016 1444   ALT 24 04/14/2013 0951   ALKPHOS 68 10/23/2016 1444   ALKPHOS 60 04/14/2013 0951   BILITOT <0.2 10/23/2016 1444   BILITOT 0.34 04/14/2013 0951   GFRNONAA 75 10/23/2016 1444   GFRAA 86 10/23/2016 1444   Lab Results  Component Value Date   CHOL 168 02/20/2011   HDL 57.30 02/20/2011   LDLCALC 79 02/20/2011   LDLDIRECT 105.9 07/09/2006  TRIG 161.0 (H) 02/20/2011   CHOLHDL 3 02/20/2011   No results found for: HGBA1C Lab Results  Component Value Date   VITAMINB12 415 05/18/2008   Lab Results  Component Value Date   TSH 1.339  10/24/2010      ASSESSMENT AND PLAN 62 y.o. year old female  has a past medical history of ALLERGIC RHINITIS (10/27/2006), Anemia, BREAST CANCER, HX OF (10/27/2006), Clotting disorder ([place]), GERD (10/27/2006), HYPERLIPIDEMIA (10/27/2006), HYPERTENSION (10/27/2006), Memory loss, Migraines, Organic brain syndrome, PULMONARY EMBOLISM, HX OF (10/27/2006), and RENAL INSUFFICIENCY (08/01/2009). here with:  1.  Cognitive dysfunction 2.  Migraine headaches  The patient's memory score has remained stable.  We will increase Aricept to 10 mg daily.  She will continue to use Amerge for acute treatment of her migraines.  If her headaches do not respond to Amerge she can use oxycodone as needed.  I did check the [place]  drug registry and the patient has not received oxycodone from another provider or any other controlled substance in the last year.  She is advised that if her symptoms worsen or she develops new symptoms she should let us know.  She will follow-up in 1 year or sooner if needed.    Ward Givens, MSN, NP-C 04/28/2017, 11:09 AM [facility] [address], [place], [place] [address] [phone]

## 2017-07-31 ENCOUNTER — Other Ambulatory Visit: Payer: Self-pay | Admitting: Neurology

## 2017-08-19 ENCOUNTER — Other Ambulatory Visit: Payer: Self-pay | Admitting: Neurology

## 2017-08-19 DIAGNOSIS — G43101 Migraine with aura, not intractable, with status migrainosus: Secondary | ICD-10-CM

## 2017-08-19 MED ORDER — OXYCODONE-ACETAMINOPHEN 10-325 MG PO TABS: 10.0000 | ORAL_TABLET | Freq: Every day | ORAL | 0 refills | Status: DC | PRN

## 2017-08-19 NOTE — Telephone Encounter (Signed)
Pt request refill for oxyCODONE-acetaminophen (PERCOCET) 10-325 MG tablet sent to [facility]

## 2017-08-20 MED ORDER — OXYCODONE-ACETAMINOPHEN 10-325 MG PO TABS: ORAL_TABLET | ORAL | 0 refills | Status: DC

## 2017-08-20 NOTE — Addendum Note (Signed)
Addended by: Lester Trujillo [place] A on: 08/20/2017 08:13 AM   Modules accepted: Orders

## 2017-08-20 NOTE — Addendum Note (Signed)
Addended by: Larey Seat on: 08/20/2017 11:07 AM   Modules accepted: Orders

## 2017-08-20 NOTE — Addendum Note (Signed)
Addended by: Lester [place] A on: 08/20/2017 11:12 AM   Modules accepted: Orders

## 2017-09-26 ENCOUNTER — Other Ambulatory Visit: Payer: Self-pay | Admitting: Neurology

## 2017-09-28 ENCOUNTER — Telehealth: Payer: Self-pay | Admitting: *Deleted

## 2017-09-28 MED ORDER — SUMATRIPTAN SUCCINATE 50 MG PO TABS: ORAL_TABLET | ORAL | 11 refills | Status: DC

## 2017-09-28 NOTE — Telephone Encounter (Signed)
Received a request from the pharmacy stating the patient had a $47 co-pay with naratriptan.  We were also informed that sumatriptan would be free for her.   I called the patient and she would like to make this change.  She is aware to not use her remaining naratriptan along with sumatriptan.  States she often has to take two doses of naratriptan before it really works so she is hopeful sumatriptan will work a little better.  The pharmacy request was for sumatriptan 50mg , #9 per 30 days.  Per vo by Jinny Blossom, ok to send in this prescription.

## 2018-03-24 ENCOUNTER — Encounter: Payer: Self-pay | Admitting: Adult Health

## 2018-04-02 ENCOUNTER — Other Ambulatory Visit: Payer: Self-pay | Admitting: Adult Health

## 2018-04-02 DIAGNOSIS — G43101 Migraine with aura, not intractable, with status migrainosus: Secondary | ICD-10-CM

## 2018-04-02 DIAGNOSIS — R4189 Other symptoms and signs involving cognitive functions and awareness: Secondary | ICD-10-CM

## 2018-04-02 NOTE — Telephone Encounter (Signed)
Aricept refilled x 3 months. Patient needs to schedule FU.

## 2018-04-20 DIAGNOSIS — G43109 Migraine with aura, not intractable, without status migrainosus: Secondary | ICD-10-CM | POA: Insufficient documentation

## 2018-04-20 NOTE — Progress Notes (Deleted)
PATIENT: Lauren Fuller DOB: 10/13/1955  REASON FOR VISIT: follow up HISTORY FROM: patient  No chief complaint on file.    HISTORY OF PRESENT ILLNESS: Today 04/20/18 Lauren Fuller is a 63 y.o. female here today for follow up.   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  She returns today for follow-up.  She reports that her headaches remain under good control.  She has approximately 5 headaches a month.  She typically can get relief with Amerge but if she has a severe headache that does not respond she will use oxycodone.  She states typically her headaches occur on the right side she does have photophobia but denies nausea and vomiting. She feels that her cognition is slightly worse.  She states she has more of a concentration and focus issue.  In the past she has been on Adderall with no noticeable benefit.  She is able to complete all ADLs independently.  She denies any issues driving.  She manages her own finances without difficulty.  Denies any change in appetite.  She is able to prepare meals without difficulty.  She returns today for an evaluation.  REVIEW OF SYSTEMS: Out of a complete 14 system review of symptoms, the patient complains only of the following symptoms, and all other reviewed systems are negative.  ALLERGIES: Allergies  Allergen Reactions  . Aspirin-Acetaminophen-Caffeine Anaphylaxis    Excedrin - jittery excedrin REACTION: makes me really nervous inside  . Butalbital-Aspirin-Caffeine     dizzy  . Butalbital-Aspirin-Caffeine     dizzy  . Codeine Itching    REACTION: itching  . Cyclosporine Other (See Comments) and Itching  . Fiorinal P-F W/Codeine  [Butalbital-Asa-Caff-Codeine] Other (See Comments)    FLORINAL 3.- severe sedation  . Hydromorphone Hcl     REACTION: not effective  . Pentazocine Lactate     REACTION:  hallucinations  . Pentazocine Lactate     Makes pt feel drunk  . Pentazocine Anxiety    hallucinations    HOME MEDICATIONS: Outpatient Medications Prior to Visit  Medication Sig Dispense Refill  . ascorbic acid (VITAMIN C) 1000 MG tablet 1,000 mg.    . atorvastatin (LIPITOR) 40 MG tablet TAKE 1 TABLET DAILY 90 tablet 3  . Calcium Carbonate-Vitamin D (CALCIUM 600-D) 600-400 MG-UNIT per tablet 1 tablet.    . donepezil (ARICEPT) 10 MG tablet TAKE 1 TABLET BY MOUTH EVERY DAY 90 tablet 0  . fenofibrate (TRICOR) 145 MG tablet 145 mg.    . Flaxseed, Linseed, (FLAXSEED OIL) 1000 MG CAPS Take by mouth daily.    . Folic Acid-Vit Z6-XWR U04 (B COMPLEX-FOLIC ACID) 540-9-811 MCG-MG-MCG TABS Take by mouth.    . meloxicam (MOBIC) 7.5 MG tablet Take 7.5 mg by mouth daily.      . methocarbamol (ROBAXIN) 500 MG tablet Take 500 mg by mouth every 6 (six) hours as needed for muscle spasms (Every 6-8 hours as needed).     . mirtazapine (REMERON SOL-TAB) 30 MG disintegrating tablet DISSOLVE ONE TABLET IN MOUTH AT BEDTIME 90 tablet 1  . Multiple Vitamin (MULTIVITAMIN) capsule Take 1 capsule by mouth daily.    . Omega-3 Fatty Acids (FISH OIL) 1000 MG CAPS 1 capsule.    Marland Kitchen omeprazole (PRILOSEC) 40 MG capsule Take 40 mg by mouth daily.    Marland Kitchen oxyCODONE-acetaminophen (PERCOCET) 10-325 MG tablet Take 1 tablet daily as needed. Do not  exceed 2 tablets within 24 hours. 30 tablet 0  . Prenatal w/o A Vit-Fe Fum-FA (BP MULTINATAL PLUS) 30-1 MG TABS Take by mouth.    . sodium bicarbonate 650 MG tablet TAKE TWO TABLETS (1,300 MG TOTAL) BY MOUTH 2 (TWO) TIMES DAILY.  1  . SUMAtriptan (IMITREX) 50 MG tablet Take 1 tab at onset of migraine.  May repeat in 2 hrs, if needed.  Max dose: 2 tabs/day. This is a 30 day prescription. 9 tablet 11  . venlafaxine XR (EFFEXOR XR) 75 MG 24 hr capsule Take 3 capsules (225 mg total) by mouth daily with breakfast. 270 capsule 0  . VITAMIN D, CHOLECALCIFEROL, PO Take 1,000 Units by mouth daily.       No facility-administered medications prior to visit.     PAST MEDICAL HISTORY: Past Medical History:  Diagnosis Date  . ALLERGIC RHINITIS 10/27/2006  . Anemia   . BREAST CANCER, HX OF 10/27/2006  . Clotting disorder ([place])   . GERD 10/27/2006  . HYPERLIPIDEMIA 10/27/2006  . HYPERTENSION 10/27/2006  . Memory loss   . Migraines   . Organic brain syndrome   . PULMONARY EMBOLISM, HX OF 10/27/2006  . RENAL INSUFFICIENCY 08/01/2009    PAST SURGICAL HISTORY: Past Surgical History:  Procedure Laterality Date  . ABDOMINAL HYSTERECTOMY    . BREAST SURGERY     mastectomy  . CATARACT EXTRACTION Right 01/2016  . CESAREAN SECTION    . CHOLECYSTECTOMY    . KNEE SURGERY     left  . NISSEN FUNDOPLICATION    . TUBAL LIGATION    . VOCAL CORD INJECTION      FAMILY HISTORY: Family History  Problem Relation Age of Onset  . Heart disease Mother   . Heart disease Father   . Cancer Maternal Uncle        leukemia  . Cancer Sister        lung    SOCIAL HISTORY: Social History   Socioeconomic History  . Marital status: Married    Spouse name: Lauren Fuller  . Number of children: 2  . Years of education: BS  . Highest education level: Not on file  Occupational History    Employer: [employer]  Social Needs  . Financial resource strain: Not on file  . Food insecurity:    Worry: Not on file    Inability: Not on file  . Transportation needs:    Medical: Not on file    Non-medical: Not on file  Tobacco Use  . Smoking status: Never Smoker  . Smokeless tobacco: Never Used  Substance and Sexual Activity  . Alcohol use: No  . Drug use: No  . Sexual activity: Not on file  Lifestyle  . Physical activity:    Days per week: Not on file    Minutes per session: Not on file  . Stress: Not on file  Relationships  . Social connections:    Talks on phone: Not on file    Gets together: Not on file    Attends religious service: Not on file    Active member of club or organization: Not on  file    Attends meetings of clubs or organizations: Not on file    Relationship status: Not on file  . Intimate partner violence:    Fear of current or ex partner: Not on file    Emotionally abused: Not on file    Physically abused: Not on file    Forced sexual activity:  Not on file  Other Topics Concern  . 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   Patient has two children.   Patient is disabled.   Patient drinks caffeine three to four times per week.   Patient is right-handed.   Patient has a Financial planner.      PHYSICAL EXAM  There were no vitals filed for this visit. There is no height or weight on file to calculate BMI.  Generalized: Well developed, in no acute distress  Cardiology: normal rate and rhythm, no murmur noted Neurological examination  Mentation: Alert oriented to time, place, history taking. Follows all commands speech and language fluent Cranial nerve II-XII: Pupils were equal round reactive to light. Extraocular movements were full, visual field were full on confrontational test. Facial sensation and strength were normal. Uvula tongue midline. Head turning and shoulder shrug  were normal and symmetric. Motor: The motor testing reveals 5 over 5 strength of all 4 extremities. Good symmetric motor tone is noted throughout.  Sensory: Sensory testing is intact to soft touch on all 4 extremities. No evidence of extinction is noted.  Coordination: Cerebellar testing reveals good finger-nose-finger and heel-to-shin bilaterally.  Gait and station: Gait is normal. Tandem gait is normal. Romberg is negative. No drift is seen.  Reflexes: Deep tendon reflexes are symmetric and normal bilaterally.   DIAGNOSTIC DATA (LABS, IMAGING, TESTING) - I reviewed patient records, labs, notes, testing and imaging myself where available.  No flowsheet data found.   Lab Results  Component Value Date   WBC  8.2 10/23/2016   HGB 12.2 10/23/2016   HCT 36.7 10/23/2016   MCV 90 10/23/2016   PLT 402 (H) 10/23/2016      Component Value Date/Time   NA 141 10/23/2016 1444   NA 141 04/14/2013 0951   K 4.2 10/23/2016 1444   K 4.0 04/14/2013 0951   CL 102 10/23/2016 1444   CO2 23 10/23/2016 1444   CO2 28 04/14/2013 0951   GLUCOSE 90 10/23/2016 1444   GLUCOSE 101 04/14/2013 0951   BUN 21 10/23/2016 1444   BUN 23.6 04/14/2013 0951   CREATININE 0.85 10/23/2016 1444   CREATININE 0.8 04/14/2013 0951   CALCIUM 9.6 10/23/2016 1444   CALCIUM 9.5 04/14/2013 0951   PROT 7.0 10/23/2016 1444   PROT 6.7 04/14/2013 0951   ALBUMIN 4.5 10/23/2016 1444   ALBUMIN 4.0 04/14/2013 0951   AST 26 10/23/2016 1444   AST 18 04/14/2013 0951   ALT 34 (H) 10/23/2016 1444   ALT 24 04/14/2013 0951   ALKPHOS 68 10/23/2016 1444   ALKPHOS 60 04/14/2013 0951   BILITOT <0.2 10/23/2016 1444   BILITOT 0.34 04/14/2013 0951   GFRNONAA 75 10/23/2016 1444   GFRAA 86 10/23/2016 1444   Lab Results  Component Value Date   CHOL 168 02/20/2011   HDL 57.30 02/20/2011   LDLCALC 79 02/20/2011   LDLDIRECT 105.9 07/09/2006   TRIG 161.0 (H) 02/20/2011   CHOLHDL 3 02/20/2011   No results found for: HGBA1C Lab Results  Component Value Date   VITAMINB12 415 05/18/2008   Lab Results  Component Value Date   TSH 1.339 10/24/2010       ASSESSMENT AND PLAN 63 y.o. year old female  has a past medical history of ALLERGIC RHINITIS (10/27/2006), Anemia, BREAST CANCER, HX OF (10/27/2006), Clotting disorder ([place]), GERD (10/27/2006), HYPERLIPIDEMIA (10/27/2006), HYPERTENSION (10/27/2006), Memory loss, Migraines, Organic  brain syndrome, PULMONARY EMBOLISM, HX OF (10/27/2006), and RENAL INSUFFICIENCY (08/01/2009). here with ***    ICD-10-CM   1. Migraine with aura and without status migrainosus, not intractable G43.109      WILLIS  No orders of the defined types were placed in this encounter.    No orders of the defined types were  placed in this encounter.     I spent 15 minutes with the patient. 50% of this time was spent counseling and educating patient on plan of care and medications.    Debbora Presto, FNP-C 04/20/2018, 4:26 PM [facility] [address], [place], [place] [address] [phone]

## 2018-04-28 ENCOUNTER — Ambulatory Visit: Payer: Self-pay | Admitting: Family Medicine

## 2018-04-28 ENCOUNTER — Telehealth: Payer: Self-pay

## 2018-04-28 ENCOUNTER — Ambulatory Visit: Payer: Medicare HMO | Admitting: Adult Health

## 2018-04-28 NOTE — Telephone Encounter (Signed)
Patient called and cancelled her appointment due to her having a migraine.

## 2018-04-29 ENCOUNTER — Encounter: Payer: Self-pay | Admitting: Family Medicine

## 2018-04-30 NOTE — Progress Notes (Deleted)
PATIENT: Lauren Fuller DOB: 25-Jun-1955  REASON FOR VISIT: follow up HISTORY FROM: patient  No chief complaint on file.    HISTORY OF PRESENT ILLNESS: Today 04/30/18 Lauren Fuller is a 63 y.o. female here today for follow up. She is taking Effexor 75mg  daily. She was recently switched to Imitrex from Amerge due to cost of medication. She will take an oxycodone-acetaminophen 10/325mg  tablet for really bad headaches unresponsive to triptan. She uses Robaxin as needed.   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  She returns today for follow-up.  She reports that her headaches remain under good control.  She has approximately 5 headaches a month.  She typically can get relief with Amerge but if she has a severe headache that does not respond she will use oxycodone.  She states typically her headaches occur on the right side she does have photophobia but denies nausea and vomiting.  She feels that her cognition is slightly worse.  She states she has more of a concentration and focus issue.  In the past she has been on Adderall with no noticeable benefit.  She is able to complete all ADLs independently.  She denies any issues driving.  She manages her own finances without difficulty.  Denies any change in appetite.  She is able to prepare meals without difficulty.  She returns today for an evaluation.  REVIEW OF SYSTEMS: Out of a complete 14 system review of symptoms, the patient complains only of the following symptoms, and all other reviewed systems are negative.  ALLERGIES: Allergies  Allergen Reactions  . Aspirin-Acetaminophen-Caffeine Anaphylaxis    Excedrin - jittery excedrin REACTION: makes me really nervous inside  . Butalbital-Aspirin-Caffeine     dizzy  . Butalbital-Aspirin-Caffeine     dizzy  . Codeine Itching    REACTION: itching  .  Cyclosporine Other (See Comments) and Itching  . Fiorinal P-F W/Codeine  [Butalbital-Asa-Caff-Codeine] Other (See Comments)    FLORINAL 3.- severe sedation  . Hydromorphone Hcl     REACTION: not effective  . Pentazocine Lactate     REACTION: hallucinations  . Pentazocine Lactate     Makes pt feel drunk  . Pentazocine Anxiety    hallucinations    HOME MEDICATIONS: Outpatient Medications Prior to Visit  Medication Sig Dispense Refill  . ascorbic acid (VITAMIN C) 1000 MG tablet 1,000 mg.    . atorvastatin (LIPITOR) 40 MG tablet TAKE 1 TABLET DAILY 90 tablet 3  . Calcium Carbonate-Vitamin D (CALCIUM 600-D) 600-400 MG-UNIT per tablet 1 tablet.    . donepezil (ARICEPT) 10 MG tablet TAKE 1 TABLET BY MOUTH EVERY DAY 90 tablet 0  . fenofibrate (TRICOR) 145 MG tablet 145 mg.    . Flaxseed, Linseed, (FLAXSEED OIL) 1000 MG CAPS Take by mouth daily.    . Folic Acid-Vit O1-YWV P71 (B COMPLEX-FOLIC ACID) 062-6-948 MCG-MG-MCG TABS Take by mouth.    . meloxicam (MOBIC) 7.5 MG tablet Take 7.5 mg by mouth daily.      . methocarbamol (ROBAXIN) 500 MG tablet Take 500 mg by mouth every 6 (six) hours as needed for muscle spasms (Every 6-8 hours as needed).     . mirtazapine (REMERON SOL-TAB) 30 MG disintegrating tablet DISSOLVE ONE TABLET IN MOUTH AT BEDTIME 90 tablet 1  . Multiple Vitamin (MULTIVITAMIN) capsule Take 1 capsule by mouth daily.    . Omega-3 Fatty Acids (  FISH OIL) 1000 MG CAPS 1 capsule.    Marland Kitchen omeprazole (PRILOSEC) 40 MG capsule Take 40 mg by mouth daily.    Marland Kitchen oxyCODONE-acetaminophen (PERCOCET) 10-325 MG tablet Take 1 tablet daily as needed. Do not exceed 2 tablets within 24 hours. 30 tablet 0  . Prenatal w/o A Vit-Fe Fum-FA (BP MULTINATAL PLUS) 30-1 MG TABS Take by mouth.    . sodium bicarbonate 650 MG tablet TAKE TWO TABLETS (1,300 MG TOTAL) BY MOUTH 2 (TWO) TIMES DAILY.  1  . SUMAtriptan (IMITREX) 50 MG tablet Take 1 tab at onset of migraine.  May repeat in 2 hrs, if needed.  Max dose: 2  tabs/day. This is a 30 day prescription. 9 tablet 11  . venlafaxine XR (EFFEXOR XR) 75 MG 24 hr capsule Take 3 capsules (225 mg total) by mouth daily with breakfast. 270 capsule 0  . VITAMIN D, CHOLECALCIFEROL, PO Take 1,000 Units by mouth daily.      No facility-administered medications prior to visit.     PAST MEDICAL HISTORY: Past Medical History:  Diagnosis Date  . ALLERGIC RHINITIS 10/27/2006  . Anemia   . BREAST CANCER, HX OF 10/27/2006  . Clotting disorder ([place])   . GERD 10/27/2006  . HYPERLIPIDEMIA 10/27/2006  . HYPERTENSION 10/27/2006  . Memory loss   . Migraines   . Organic brain syndrome   . PULMONARY EMBOLISM, HX OF 10/27/2006  . RENAL INSUFFICIENCY 08/01/2009    PAST SURGICAL HISTORY: Past Surgical History:  Procedure Laterality Date  . ABDOMINAL HYSTERECTOMY    . BREAST SURGERY     mastectomy  . CATARACT EXTRACTION Right 01/2016  . CESAREAN SECTION    . CHOLECYSTECTOMY    . KNEE SURGERY     left  . NISSEN FUNDOPLICATION    . TUBAL LIGATION    . VOCAL CORD INJECTION      FAMILY HISTORY: Family History  Problem Relation Age of Onset  . Heart disease Mother   . Heart disease Father   . Cancer Maternal Uncle        leukemia  . Cancer Sister        lung    SOCIAL HISTORY: Social History   Socioeconomic History  . Marital status: Married    Spouse name: Konrad Dolores  . Number of children: 2  . Years of education: BS  . Highest education level: Not on file  Occupational History    Employer: [employer]  Social Needs  . Financial resource strain: Not on file  . Food insecurity:    Worry: Not on file    Inability: Not on file  . Transportation needs:    Medical: Not on file    Non-medical: Not on file  Tobacco Use  . Smoking status: Never Smoker  . Smokeless tobacco: Never Used  Substance and Sexual Activity  . Alcohol use: No  . Drug use: No  . Sexual activity: Not on file  Lifestyle  . Physical activity:    Days per week: Not on file     Minutes per session: Not on file  . Stress: Not on file  Relationships  . Social connections:    Talks on phone: Not on file    Gets together: Not on file    Attends religious service: Not on file    Active member of club or organization: Not on file    Attends meetings of clubs or organizations: Not on file    Relationship status: Not on file  .  Intimate partner violence:    Fear of current or ex partner: Not on file    Emotionally abused: Not on file    Physically abused: Not on file    Forced sexual activity: Not on file  Other Topics Concern  . 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   Patient has two children.   Patient is disabled.   Patient drinks caffeine three to four times per week.   Patient is right-handed.   Patient has a Financial planner.      PHYSICAL EXAM  There were no vitals filed for this visit. There is no height or weight on file to calculate BMI.  Generalized: Well developed, in no acute distress  Cardiology: normal rate and rhythm, no murmur noted Neurological examination  Mentation: Alert oriented to time, place, history taking. Follows all commands speech and language fluent Cranial nerve II-XII: Pupils were equal round reactive to light. Extraocular movements were full, visual field were full on confrontational test. Facial sensation and strength were normal. Uvula tongue midline. Head turning and shoulder shrug  were normal and symmetric. Motor: The motor testing reveals 5 over 5 strength of all 4 extremities. Good symmetric motor tone is noted throughout.  Sensory: Sensory testing is intact to soft touch on all 4 extremities. No evidence of extinction is noted.  Coordination: Cerebellar testing reveals good finger-nose-finger and heel-to-shin bilaterally.  Gait and station: Gait is normal. Tandem gait is normal. Romberg is negative. No drift is seen.  Reflexes: Deep tendon  reflexes are symmetric and normal bilaterally.   DIAGNOSTIC DATA (LABS, IMAGING, TESTING) - I reviewed patient records, labs, notes, testing and imaging myself where available.  No flowsheet data found.   Lab Results  Component Value Date   WBC 8.2 10/23/2016   HGB 12.2 10/23/2016   HCT 36.7 10/23/2016   MCV 90 10/23/2016   PLT 402 (H) 10/23/2016      Component Value Date/Time   NA 141 10/23/2016 1444   NA 141 04/14/2013 0951   K 4.2 10/23/2016 1444   K 4.0 04/14/2013 0951   CL 102 10/23/2016 1444   CO2 23 10/23/2016 1444   CO2 28 04/14/2013 0951   GLUCOSE 90 10/23/2016 1444   GLUCOSE 101 04/14/2013 0951   BUN 21 10/23/2016 1444   BUN 23.6 04/14/2013 0951   CREATININE 0.85 10/23/2016 1444   CREATININE 0.8 04/14/2013 0951   CALCIUM 9.6 10/23/2016 1444   CALCIUM 9.5 04/14/2013 0951   PROT 7.0 10/23/2016 1444   PROT 6.7 04/14/2013 0951   ALBUMIN 4.5 10/23/2016 1444   ALBUMIN 4.0 04/14/2013 0951   AST 26 10/23/2016 1444   AST 18 04/14/2013 0951   ALT 34 (H) 10/23/2016 1444   ALT 24 04/14/2013 0951   ALKPHOS 68 10/23/2016 1444   ALKPHOS 60 04/14/2013 0951   BILITOT <0.2 10/23/2016 1444   BILITOT 0.34 04/14/2013 0951   GFRNONAA 75 10/23/2016 1444   GFRAA 86 10/23/2016 1444   Lab Results  Component Value Date   CHOL 168 02/20/2011   HDL 57.30 02/20/2011   LDLCALC 79 02/20/2011   LDLDIRECT 105.9 07/09/2006   TRIG 161.0 (H) 02/20/2011   CHOLHDL 3 02/20/2011   No results found for: HGBA1C Lab Results  Component Value Date   VITAMINB12 415 05/18/2008   Lab Results  Component Value Date   TSH 1.339 10/24/2010  ASSESSMENT AND PLAN 63 y.o. year old female  has a past medical history of ALLERGIC RHINITIS (10/27/2006), Anemia, BREAST CANCER, HX OF (10/27/2006), Clotting disorder ([place]), GERD (10/27/2006), HYPERLIPIDEMIA (10/27/2006), HYPERTENSION (10/27/2006), Memory loss, Migraines, Organic brain syndrome, PULMONARY EMBOLISM, HX OF (10/27/2006), and RENAL  INSUFFICIENCY (08/01/2009). here with ***    ICD-10-CM   1. Migraine with aura and with status migrainosus, not intractable G43.101        No orders of the defined types were placed in this encounter.    No orders of the defined types were placed in this encounter.     I spent 15 minutes with the patient. 50% of this time was spent counseling and educating patient on plan of care and medications.    Debbora Presto, FNP-C 04/30/2018, 10:05 AM [facility] [address], [place], [place] [address] [phone]

## 2018-05-03 ENCOUNTER — Ambulatory Visit: Payer: Medicare HMO | Admitting: Family Medicine

## 2018-05-07 ENCOUNTER — Ambulatory Visit: Payer: Medicare HMO | Admitting: Family Medicine

## 2018-05-17 NOTE — Progress Notes (Signed)
PATIENT: Lauren Fuller DOB: 02-03-1956  REASON FOR VISIT: follow up HISTORY FROM: patient  Chief Complaint  Patient presents with  . Follow-up    Yearly follow up. Alone. Rm 9. No concerns at this time.      HISTORY OF PRESENT ILLNESS: Today 05/18/18 Lauren Fuller is a 63 y.o. female here today for follow up for migraines and cognitive dysfunction following chemotherapy. She is taking Aricept 10mg  daily. She feels that cognition is stable. She is still noticing some forgetfulness but overall she is doing well. She has about 5 migraines a month. She feels this is significantly better than her baseline of daily migraines about 6-7 years ago. Botox helped to drop these migraine to about 15-18 per month. She is using sumatriptan as needed for migraine abortion. She does feel that most times this will knock the headache out. She rarely has to take an oxycodone for migraines. She got a dath piercing about 3 years ago. She feels that this has helped her.  She is helping to care for her adult son who is suffering from seizures.  HISTORY: (copied from [place] note on 04/28/2017) Ms. 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  She returns today for follow-up.  She reports that her headaches remain under good control.  She has approximately 5 headaches a month.  She typically can get relief with Amerge but if she has a severe headache that does not respond she will use oxycodone.  She states typically her headaches occur on the right side she does have photophobia but denies nausea and vomiting.  She feels that her cognition is slightly worse.  She states she has more of a concentration and focus issue.  In the past she has been on Adderall with no noticeable benefit.  She is able to complete all ADLs independently.  She denies any issues driving.  She manages her own finances without difficulty.  Denies any change in  appetite.  She is able to prepare meals without difficulty.  She returns today for an evaluation.  REVIEW OF SYSTEMS: Out of a complete 14 system review of symptoms, the patient complains only of the following symptoms, headache, staying on task and all other reviewed systems are negative.  ALLERGIES: Allergies  Allergen Reactions  . Aspirin-Acetaminophen-Caffeine Anaphylaxis    Excedrin - jittery excedrin REACTION: makes me really nervous inside  . Butalbital-Aspirin-Caffeine     dizzy  . Butalbital-Aspirin-Caffeine     dizzy  . Codeine Itching    REACTION: itching  . Cyclosporine Other (See Comments) and Itching  . Fiorinal P-F W/Codeine  [Butalbital-Asa-Caff-Codeine] Other (See Comments)    FLORINAL 3.- severe sedation  . Hydromorphone Hcl     REACTION: not effective  . Pentazocine Lactate     REACTION: hallucinations  . Pentazocine Lactate     Makes pt feel drunk  . Pentazocine Anxiety    hallucinations    HOME MEDICATIONS: Outpatient Medications Prior to Visit  Medication Sig Dispense Refill  . ascorbic acid (VITAMIN C) 1000 MG tablet 1,000 mg.    . atorvastatin (LIPITOR) 40 MG tablet TAKE 1 TABLET DAILY 90 tablet 3  . donepezil (ARICEPT) 10 MG tablet TAKE 1 TABLET BY MOUTH EVERY DAY 90 tablet 0  . Flaxseed, Linseed, (FLAXSEED OIL) 1000 MG CAPS Take by mouth daily.    . Folic Acid-Vit Q0-GQQ P61 (B COMPLEX-FOLIC ACID) 950-9-326 MCG-MG-MCG TABS Take by mouth.    Marland Kitchen  meloxicam (MOBIC) 7.5 MG tablet Take 7.5 mg by mouth daily.      . methocarbamol (ROBAXIN) 500 MG tablet Take 500 mg by mouth every 6 (six) hours as needed for muscle spasms (Every 6-8 hours as needed).     . mirtazapine (REMERON SOL-TAB) 30 MG disintegrating tablet DISSOLVE ONE TABLET IN MOUTH AT BEDTIME 90 tablet 1  . Multiple Vitamin (MULTIVITAMIN) capsule Take 1 capsule by mouth daily.    . Omega-3 Fatty Acids (FISH OIL) 1000 MG CAPS 1 capsule.    Marland Kitchen omeprazole (PRILOSEC) 40 MG capsule Take 40 mg by mouth  daily.    Marland Kitchen oxyCODONE-acetaminophen (PERCOCET) 10-325 MG tablet Take 1 tablet daily as needed. Do not exceed 2 tablets within 24 hours. 30 tablet 0  . Prenatal w/o A Vit-Fe Fum-FA (BP MULTINATAL PLUS) 30-1 MG TABS Take by mouth.    . sodium bicarbonate 650 MG tablet TAKE TWO TABLETS (1,300 MG TOTAL) BY MOUTH 2 (TWO) TIMES DAILY.  1  . SUMAtriptan (IMITREX) 50 MG tablet Take 1 tab at onset of migraine.  May repeat in 2 hrs, if needed.  Max dose: 2 tabs/day. This is a 30 day prescription. 9 tablet 11  . venlafaxine XR (EFFEXOR XR) 75 MG 24 hr capsule Take 3 capsules (225 mg total) by mouth daily with breakfast. 270 capsule 0  . VITAMIN D, CHOLECALCIFEROL, PO Take 1,000 Units by mouth daily.     . Calcium Carbonate-Vitamin D (CALCIUM 600-D) 600-400 MG-UNIT per tablet 1 tablet.    . fenofibrate (TRICOR) 145 MG tablet 145 mg.     No facility-administered medications prior to visit.     PAST MEDICAL HISTORY: Past Medical History:  Diagnosis Date  . ALLERGIC RHINITIS 10/27/2006  . Anemia   . BREAST CANCER, HX OF 10/27/2006  . Clotting disorder ([place])   . GERD 10/27/2006  . HYPERLIPIDEMIA 10/27/2006  . HYPERTENSION 10/27/2006  . Memory loss   . Migraines   . Organic brain syndrome   . PULMONARY EMBOLISM, HX OF 10/27/2006  . RENAL INSUFFICIENCY 08/01/2009    PAST SURGICAL HISTORY: Past Surgical History:  Procedure Laterality Date  . ABDOMINAL HYSTERECTOMY    . BREAST SURGERY     mastectomy  . CATARACT EXTRACTION Right 01/2016  . CESAREAN SECTION    . CHOLECYSTECTOMY    . KNEE SURGERY     left  . NISSEN FUNDOPLICATION    . TUBAL LIGATION    . VOCAL CORD INJECTION      FAMILY HISTORY: Family History  Problem Relation Age of Onset  . Heart disease Mother   . Heart disease Father   . Cancer Maternal Uncle        leukemia  . Cancer Sister        lung    SOCIAL HISTORY: Social History   Socioeconomic History  . Marital status: Married    Spouse name: Konrad Dolores  . Number of  children: 2  . Years of education: BS  . Highest education level: Not on file  Occupational History    Employer: [employer]  Social Needs  . Financial resource strain: Not on file  . Food insecurity:    Worry: Not on file    Inability: Not on file  . Transportation needs:    Medical: Not on file    Non-medical: Not on file  Tobacco Use  . Smoking status: Never Smoker  . Smokeless tobacco: Never Used  Substance and Sexual Activity  . Alcohol  use: No  . Drug use: No  . Sexual activity: Not on file  Lifestyle  . Physical activity:    Days per week: Not on file    Minutes per session: Not on file  . Stress: Not on file  Relationships  . Social connections:    Talks on phone: Not on file    Gets together: Not on file    Attends religious service: Not on file    Active member of club or organization: Not on file    Attends meetings of clubs or organizations: Not on file    Relationship status: Not on file  . Intimate partner violence:    Fear of current or ex partner: Not on file    Emotionally abused: Not on file    Physically abused: Not on file    Forced sexual activity: Not on file  Other Topics Concern  . 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   Patient has two children.   Patient is disabled.   Patient drinks caffeine three to four times per week.   Patient is right-handed.   Patient has a Financial planner.    PHYSICAL EXAM  Vitals:   05/18/18 1419  BP: 137/83  Pulse: 95  Weight: 176 lb (79.8 kg)  Height: 5' 4.5" (1.638 m)   Body mass index is 29.74 kg/m.  Generalized: Well developed, in no acute distress  Cardiology: normal rate and rhythm, no murmur noted Neurological examination  Mentation: Alert oriented to time, place, history taking. Follows all commands speech and language fluent Cranial nerve II-XII: Pupils were equal round reactive to light. Extraocular  movements were full, visual field were full on confrontational test. Facial sensation and strength were normal. Uvula tongue midline. Head turning and shoulder shrug  were normal and symmetric. Motor: The motor testing reveals 5 over 5 strength of all 4 extremities. Good symmetric motor tone is noted throughout.  Sensory: Sensory testing is intact to soft touch on all 4 extremities. No evidence of extinction is noted.  Coordination: Cerebellar testing reveals good finger-nose-finger and heel-to-shin bilaterally.  Gait and station: Gait is normal. Reflexes: Deep tendon reflexes are symmetric and normal bilaterally.   DIAGNOSTIC DATA (LABS, IMAGING, TESTING) - I reviewed patient records, labs, notes, testing and imaging myself where available.  No flowsheet data found.   Lab Results  Component Value Date   WBC 8.2 10/23/2016   HGB 12.2 10/23/2016   HCT 36.7 10/23/2016   MCV 90 10/23/2016   PLT 402 (H) 10/23/2016      Component Value Date/Time   NA 141 10/23/2016 1444   NA 141 04/14/2013 0951   K 4.2 10/23/2016 1444   K 4.0 04/14/2013 0951   CL 102 10/23/2016 1444   CO2 23 10/23/2016 1444   CO2 28 04/14/2013 0951   GLUCOSE 90 10/23/2016 1444   GLUCOSE 101 04/14/2013 0951   BUN 21 10/23/2016 1444   BUN 23.6 04/14/2013 0951   CREATININE 0.85 10/23/2016 1444   CREATININE 0.8 04/14/2013 0951   CALCIUM 9.6 10/23/2016 1444   CALCIUM 9.5 04/14/2013 0951   PROT 7.0 10/23/2016 1444   PROT 6.7 04/14/2013 0951   ALBUMIN 4.5 10/23/2016 1444   ALBUMIN 4.0 04/14/2013 0951   AST 26 10/23/2016 1444   AST 18 04/14/2013 0951   ALT 34 (H) 10/23/2016 1444   ALT 24 04/14/2013 0951   ALKPHOS 68  10/23/2016 1444   ALKPHOS 60 04/14/2013 0951   BILITOT <0.2 10/23/2016 1444   BILITOT 0.34 04/14/2013 0951   GFRNONAA 75 10/23/2016 1444   GFRAA 86 10/23/2016 1444   Lab Results  Component Value Date   CHOL 168 02/20/2011   HDL 57.30 02/20/2011   LDLCALC 79 02/20/2011   LDLDIRECT 105.9  07/09/2006   TRIG 161.0 (H) 02/20/2011   CHOLHDL 3 02/20/2011   No results found for: HGBA1C Lab Results  Component Value Date   VITAMINB12 415 05/18/2008   Lab Results  Component Value Date   TSH 1.339 10/24/2010       ASSESSMENT AND PLAN 63 y.o. year old female  has a past medical history of ALLERGIC RHINITIS (10/27/2006), Anemia, BREAST CANCER, HX OF (10/27/2006), Clotting disorder ([place]), GERD (10/27/2006), HYPERLIPIDEMIA (10/27/2006), HYPERTENSION (10/27/2006), Memory loss, Migraines, Organic brain syndrome, PULMONARY EMBOLISM, HX OF (10/27/2006), and RENAL INSUFFICIENCY (08/01/2009). here with     ICD-10-CM   1. Migraine with aura and with status migrainosus, not intractable G43.101   2. Cognitive impairment R41.89     Overall Ms. Quinn is doing well.  She feels that her memory is stable at this time.  We will continue Aricept as prescribed.  Migraines do seem to be under control.  I have asked that she consider adding a CGRP medication to see if we can reduce migraines even further.  Information was provided on Aimovig  She will consider this medication and call her insurance to see if this is approved.  She was instructed to call our office should she decide to try medication.  We will continue sumatriptan for abortive therapy.  I have also asked that she keep a migraine diary.  I would like for her to evaluate her stress levels at home.  We will follow-up in 1 year, sooner should she decide to start CGRP.   No orders of the defined types were placed in this encounter.    No orders of the defined types were placed in this encounter.     I spent 15 minutes with the patient. 50% of this time was spent counseling and educating patient on plan of care and medications.    Debbora Presto, FNP-C 05/18/2018, 4:08 PM [facility] [address] [place], [place] [address] [phone]

## 2018-05-18 ENCOUNTER — Encounter: Payer: Self-pay | Admitting: Family Medicine

## 2018-05-18 ENCOUNTER — Ambulatory Visit (INDEPENDENT_AMBULATORY_CARE_PROVIDER_SITE_OTHER): Payer: Medicare HMO | Admitting: Family Medicine

## 2018-05-18 VITALS — BP 137/83 | HR 95 | Ht 64.5 in | Wt 176.0 lb

## 2018-05-18 DIAGNOSIS — R4189 Other symptoms and signs involving cognitive functions and awareness: Secondary | ICD-10-CM

## 2018-05-18 DIAGNOSIS — G43101 Migraine with aura, not intractable, with status migrainosus: Secondary | ICD-10-CM

## 2018-05-18 NOTE — Patient Instructions (Addendum)
Consider CGRP like Amovig, Ajovy or Emgality  Continue sumatriptan for abortive therapy  Continue Aricept daily as prescribed for memory     Erenumab injection What is this medicine? ERENUMAB (e REN ue mab) is used to prevent migraine headaches. This medicine may be used for other purposes; ask your health care provider or pharmacist if you have questions. COMMON BRAND NAME(S): Aimovig What should I tell my health care provider before I take this medicine? They need to know if you have any of these conditions: -an unusual or allergic reaction to erenumab, latex, other medicines, foods, dyes, or preservatives -pregnant or trying to get pregnant -breast-feeding How should I use this medicine? This medicine is for injection under the skin. You will be taught how to prepare and give this medicine. Use exactly as directed. Take your medicine at regular intervals. Do not take your medicine more often than directed. It is important that you put your used needles and syringes in a special sharps container. Do not put them in a trash can. If you do not have a sharps container, call your pharmacist or healthcare provider to get one. Talk to your pediatrician regarding the use of this medicine in children. Special care may be needed. Overdosage: If you think you have taken too much of this medicine contact a poison control center or emergency room at once. NOTE: This medicine is only for you. Do not share this medicine with others. What if I miss a dose? If you miss a dose, take it as soon as you can. If it is almost time for your next dose, take only that dose. Do not take double or extra doses. What may interact with this medicine? Interactions are not expected. This list may not describe all possible interactions. Give your health care provider a list of all the medicines, herbs, non-prescription drugs, or dietary supplements you use. Also tell them if you smoke, drink alcohol, or use illegal  drugs. Some items may interact with your medicine. What should I watch for while using this medicine? Tell your doctor or healthcare professional if your symptoms do not start to get better or if they get worse. What side effects may I notice from receiving this medicine? Side effects that you should report to your doctor or health care professional as soon as possible: -allergic reactions like skin rash, itching or hives, swelling of the face, lips, or tongue Side effects that usually do not require medical attention (report these to your doctor or health care professional if they continue or are bothersome): -constipation -muscle cramps -pain, redness, or irritation at site where injected This list may not describe all possible side effects. Call your doctor for medical advice about side effects. You may report side effects to FDA at 1-800-FDA-1088. Where should I keep my medicine? Keep out of the reach of children. You will be instructed on how to store this medicine. Throw away any unused medicine after the expiration date on the label. NOTE: This sheet is a summary. It may not cover all possible information. If you have questions about this medicine, talk to your doctor, pharmacist, or health care provider.  2019 Elsevier/Gold Standard (2016-08-04 09:39:04)   Migraine Headache  A migraine headache is a very strong throbbing pain on one side or both sides of your head. Migraines can also cause other symptoms. Talk with your doctor about what things may bring on (trigger) your migraine headaches. Follow these instructions at home: Medicines  Take over-the-counter and prescription  medicines only as told by your doctor.  Do not drive or use heavy machinery while taking prescription pain medicine.  To prevent or treat constipation while you are taking prescription pain medicine, your doctor may recommend that you: ? Drink enough fluid to keep your pee (urine) clear or pale  yellow. ? Take over-the-counter or prescription medicines. ? Eat foods that are high in fiber. These include fresh fruits and vegetables, whole grains, and beans. ? Limit foods that are high in fat and processed sugars. These include fried and sweet foods. Lifestyle  Avoid alcohol.  Do not use any products that contain nicotine or tobacco, such as cigarettes and e-cigarettes. If you need help quitting, ask your doctor.  Get at least 8 hours of sleep every night.  Limit your stress. General instructions   Keep a journal to find out what may bring on your migraines. For example, write down: ? What you eat and drink. ? How much sleep you get. ? Any change in what you eat or drink. ? Any change in your medicines.  If you have a migraine: ? Avoid things that make your symptoms worse, such as bright lights. ? It may help to lie down in a dark, quiet room. ? Do not drive or use heavy machinery. ? Ask your doctor what activities are safe for you.  Keep all follow-up visits as told by your doctor. This is important. Contact a doctor if:  You get a migraine that is different or worse than your usual migraines. Get help right away if:  Your migraine gets very bad.  You have a fever.  You have a stiff neck.  You have trouble seeing.  Your muscles feel weak or like you cannot control them.  You start to lose your balance a lot.  You start to have trouble walking.  You pass out (faint). This information is not intended to replace advice given to you by your health care provider. Make sure you discuss any questions you have with your health care provider. Document Released: 12/11/2007 Document Revised: 11/25/2017 Document Reviewed: 08/20/2015 Elsevier Interactive Patient Education  2019 [employer].

## 2018-06-29 ENCOUNTER — Other Ambulatory Visit: Payer: Self-pay | Admitting: Adult Health

## 2018-06-29 DIAGNOSIS — G43101 Migraine with aura, not intractable, with status migrainosus: Secondary | ICD-10-CM

## 2018-06-29 DIAGNOSIS — R4189 Other symptoms and signs involving cognitive functions and awareness: Secondary | ICD-10-CM

## 2018-08-20 ENCOUNTER — Other Ambulatory Visit: Payer: Self-pay | Admitting: Adult Health

## 2018-08-20 DIAGNOSIS — G43101 Migraine with aura, not intractable, with status migrainosus: Secondary | ICD-10-CM

## 2018-08-20 DIAGNOSIS — G43611 Persistent migraine aura with cerebral infarction, intractable, with status migrainosus: Secondary | ICD-10-CM

## 2018-08-20 DIAGNOSIS — I639 Cerebral infarction, unspecified: Secondary | ICD-10-CM

## 2018-08-20 NOTE — Telephone Encounter (Signed)
Pt is needing a refill on her oxyCODONE-acetaminophen (PERCOCET) 10-325 MG tablet sent to [facility] on [address]

## 2018-08-23 ENCOUNTER — Other Ambulatory Visit: Payer: Self-pay | Admitting: Neurology

## 2018-08-23 DIAGNOSIS — G43101 Migraine with aura, not intractable, with status migrainosus: Secondary | ICD-10-CM

## 2018-08-23 MED ORDER — OXYCODONE-ACETAMINOPHEN 10-325 MG PO TABS: ORAL_TABLET | ORAL | 0 refills | Status: DC

## 2018-08-23 NOTE — Telephone Encounter (Signed)
Drug registry checked.  Last fill 08-30-2017 #30.  Oxycodone 10/325mg 

## 2018-08-23 NOTE — Telephone Encounter (Signed)
I was unable to sign this prescription. There were multiple warnings listed and I could not overrule these entries. The prescription was not ready to sign.

## 2018-08-24 NOTE — Telephone Encounter (Signed)
Per chart, looks like it Dr. Brett Fairy approved??

## 2018-08-24 NOTE — Telephone Encounter (Signed)
Looks like Medication was sent in yesterday about Dr. Brett Fairy

## 2018-09-20 ENCOUNTER — Other Ambulatory Visit: Payer: Self-pay | Admitting: Neurology

## 2018-09-20 DIAGNOSIS — R4189 Other symptoms and signs involving cognitive functions and awareness: Secondary | ICD-10-CM

## 2018-09-20 DIAGNOSIS — G43101 Migraine with aura, not intractable, with status migrainosus: Secondary | ICD-10-CM

## 2018-10-20 ENCOUNTER — Other Ambulatory Visit: Payer: Self-pay | Admitting: Adult Health

## 2018-10-25 ENCOUNTER — Other Ambulatory Visit: Payer: Self-pay | Admitting: Adult Health

## 2018-10-31 ENCOUNTER — Other Ambulatory Visit: Payer: Self-pay | Admitting: Adult Health

## 2018-11-02 ENCOUNTER — Other Ambulatory Visit: Payer: Self-pay | Admitting: Adult Health

## 2018-11-04 ENCOUNTER — Other Ambulatory Visit: Payer: Self-pay | Admitting: Adult Health

## 2018-12-03 ENCOUNTER — Other Ambulatory Visit: Payer: Self-pay | Admitting: Family Medicine

## 2018-12-03 DIAGNOSIS — G43101 Migraine with aura, not intractable, with status migrainosus: Secondary | ICD-10-CM

## 2018-12-03 NOTE — Telephone Encounter (Signed)
Pt is needing a refill on her oxyCODONE-acetaminophen (PERCOCET) 10-325 MG tablet sent in to the [facility] [address]

## 2018-12-06 NOTE — Telephone Encounter (Signed)
Drug registry checked, Last fill 08-23-18 # 7, I called and spoke to summer at pts [facility] [address], and was an insurance issue and other 23 tabs cannot be used on that prescription, new prescription will be needed.

## 2018-12-06 NOTE — Telephone Encounter (Signed)
Cannot fill on Computer- prescription was accepted, but doesn't allow for signature.

## 2018-12-08 ENCOUNTER — Other Ambulatory Visit: Payer: Self-pay | Admitting: Family Medicine

## 2018-12-08 MED ORDER — OXYCODONE-ACETAMINOPHEN 10-325 MG PO TABS: ORAL_TABLET | ORAL | 0 refills | Status: DC

## 2018-12-08 NOTE — Telephone Encounter (Addendum)
I refilled, but there were several barriers to refilling a narcotic - 1)Age over 60. 2) Cognitive disorder.3) history of depression or anxiety.  These 30 tabs are to be used prn and should not be refilled in less than 90 days. I will ask for a narcotic treatment contract, I didn't see one on file. Can you please mail patient the contract.? And is Dr Melford Aase a prescriber for this medication as well?   Larey Seat, MD

## 2018-12-08 NOTE — Telephone Encounter (Signed)
See 12-03-18 note, sent to Dr. Brett Fairy for refill.

## 2018-12-08 NOTE — Telephone Encounter (Signed)
Pt called stating that she is needing her 30 day supply of her oxyCODONE-acetaminophen (PERCOCET) 10-325 MG tablet called [facility] [address].

## 2018-12-09 NOTE — Telephone Encounter (Addendum)
From the drug registry : This was all that I see listed.  05-16-2017 Ward Givens, NP #7 08-20-2017 CD #30 08-23-2018 CD #7 12-08-2018 CD #30.

## 2018-12-14 ENCOUNTER — Other Ambulatory Visit: Payer: Self-pay | Admitting: Neurology

## 2018-12-14 DIAGNOSIS — G43101 Migraine with aura, not intractable, with status migrainosus: Secondary | ICD-10-CM

## 2018-12-14 DIAGNOSIS — R4189 Other symptoms and signs involving cognitive functions and awareness: Secondary | ICD-10-CM

## 2018-12-14 NOTE — Telephone Encounter (Signed)
I called pt and relayed that will be sending a narcotic contract for her to read and sign per Dr. Edwena Felty instruction.  Pt verbalized understanding.  Mailed to her and she will get back to [place].  Pt will call back if questions.

## 2018-12-15 ENCOUNTER — Telehealth: Payer: Self-pay | Admitting: Neurology

## 2019-03-03 NOTE — Telephone Encounter (Signed)
I called pt to f/u on narcotic agreement, as she did not come in 12-20-18 as previously planned.  She will call back if still wants refills of hydrocodone, then will address, her coming in to sign agreement.

## 2019-04-13 ENCOUNTER — Other Ambulatory Visit: Payer: Self-pay | Admitting: Neurology

## 2019-04-13 DIAGNOSIS — R4189 Other symptoms and signs involving cognitive functions and awareness: Secondary | ICD-10-CM

## 2019-04-13 DIAGNOSIS — G43101 Migraine with aura, not intractable, with status migrainosus: Secondary | ICD-10-CM

## 2019-04-26 ENCOUNTER — Other Ambulatory Visit: Payer: Self-pay | Admitting: Neurology

## 2019-04-26 DIAGNOSIS — R4189 Other symptoms and signs involving cognitive functions and awareness: Secondary | ICD-10-CM

## 2019-04-26 DIAGNOSIS — G43101 Migraine with aura, not intractable, with status migrainosus: Secondary | ICD-10-CM

## 2019-05-19 ENCOUNTER — Ambulatory Visit: Payer: Medicare HMO | Admitting: Family Medicine

## 2019-05-19 ENCOUNTER — Telehealth: Payer: Self-pay

## 2019-05-19 NOTE — Progress Notes (Deleted)
PATIENT: Lauren Fuller DOB: 1956-02-05  REASON FOR VISIT: follow up HISTORY FROM: patient  No chief complaint on file.    HISTORY OF PRESENT ILLNESS: Today 05/19/19 [name] is a 64 y.o. female here today for follow up for memory loss post chemo and migraines. She continues Aricept 10mg  daily. She continues Effexor 225mg  daily for migraine prevention and sumatriptan for abortive therapy.   HISTORY: (copied from my note on 05/18/2018)  [name] is a 64 y.o. female here today for follow up for migraines and cognitive dysfunction following chemotherapy. She is taking Aricept 10mg  daily. She feels that cognition is stable. She is still noticing some forgetfulness but overall she is doing well. She has about 5 migraines a month. She feels this is significantly better than her baseline of daily migraines about 6-7 years ago. Botox helped to drop these migraine to about 15-18 per month. She is using sumatriptan as needed for migraine abortion. She does feel that most times this will knock the headache out. She rarely has to take an oxycodone for migraines. She got a dath piercing about 3 years ago. She feels that this has helped her.  She is helping to care for her adult son who is suffering from seizures.  HISTORY: (copied from [place] note on 04/28/2017) 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 She returns today for follow-up. She reports that her headaches remain under good control. She has approximately 5 headaches a month. She typically can get relief with Amerge but if she has a severe headache that does not respond she will use oxycodone. She states typically her headaches occur on the right side she does have photophobia but denies nausea and vomiting. She feels that her cognition is slightly worse. She states she hasmore of a concentration and focus issue. In the past she has been on  Adderall with no noticeable benefit. She is able to complete all ADLs independently. She denies any issues driving. She manages her own finances without difficulty. Denies any change in appetite. She is able to prepare meals without difficulty. She returns today for an evaluation.   REVIEW OF SYSTEMS: Out of a complete 14 system review of symptoms, the patient complains only of the following symptoms, and all other reviewed systems are negative.  ALLERGIES: Allergies  Allergen Reactions  . Aspirin-Acetaminophen-Caffeine Anaphylaxis    Excedrin - jittery excedrin REACTION: makes me really nervous inside  . Butalbital-Aspirin-Caffeine     dizzy  . Butalbital-Aspirin-Caffeine     dizzy  . Codeine Itching    REACTION: itching  . Cyclosporine Other (See Comments) and Itching  . Fiorinal P-F W/Codeine  [Butalbital-Asa-Caff-Codeine] Other (See Comments)    FLORINAL 3.- severe sedation  . Hydromorphone Hcl     REACTION: not effective  . Pentazocine Lactate     REACTION: hallucinations  . Pentazocine Lactate     Makes pt feel drunk  . Pentazocine Anxiety    hallucinations    HOME MEDICATIONS: Outpatient Medications Prior to Visit  Medication Sig Dispense Refill  . ascorbic acid (VITAMIN C) 1000 MG tablet 1,000 mg.    . atorvastatin (LIPITOR) 40 MG tablet TAKE 1 TABLET DAILY 90 tablet 3  . Calcium Carbonate-Vitamin D (CALCIUM 600-D) 600-400 MG-UNIT per tablet 1 tablet.    . donepezil (ARICEPT) 10 MG tablet TAKE 1 TABLET BY MOUTH EVERY DAY 90 tablet 0  . fenofibrate (TRICOR) 145 MG  tablet 145 mg.    . Flaxseed, Linseed, (FLAXSEED OIL) 1000 MG CAPS Take by mouth daily.    . Folic Acid-Vit [id number] (B COMPLEX-FOLIC ACID) [id number] MCG-MG-MCG TABS Take by mouth.    . meloxicam (MOBIC) 7.5 MG tablet Take 7.5 mg by mouth daily.      . methocarbamol (ROBAXIN) 500 MG tablet Take 500 mg by mouth every 6 (six) hours as needed for muscle spasms (Every 6-8 hours as needed).     .  mirtazapine (REMERON SOL-TAB) 30 MG disintegrating tablet DISSOLVE ONE TABLET IN MOUTH AT BEDTIME 90 tablet 1  . Multiple Vitamin (MULTIVITAMIN) capsule Take 1 capsule by mouth daily.    . Omega-3 Fatty Acids (FISH OIL) 1000 MG CAPS 1 capsule.    Marland Kitchen omeprazole (PRILOSEC) 40 MG capsule Take 40 mg by mouth daily.    Marland Kitchen oxyCODONE-acetaminophen (PERCOCET) 10-325 MG tablet Take 1 tablet daily as needed. Do not exceed 2 tablets within 24 hours. 30 tablet 0  . Prenatal w/o A Vit-Fe Fum-FA (BP MULTINATAL PLUS) 30-1 MG TABS Take by mouth.    . sodium bicarbonate 650 MG tablet TAKE TWO TABLETS (1,300 MG TOTAL) BY MOUTH 2 (TWO) TIMES DAILY.  1  . SUMAtriptan (IMITREX) 50 MG tablet TAKE 1 TAB ONSET OF MIGRAINE. MAY REPEAT IN 2 HRS, IF NEEDED. MAX2 TABS/DAY. [place]. 9 tablet 11  . venlafaxine XR (EFFEXOR XR) 75 MG 24 hr capsule Take 3 capsules (225 mg total) by mouth daily with breakfast. 270 capsule 0  . VITAMIN D, CHOLECALCIFEROL, PO Take 1,000 Units by mouth daily.      No facility-administered medications prior to visit.    PAST MEDICAL HISTORY: Past Medical History:  Diagnosis Date  . ALLERGIC RHINITIS 10/27/2006  . Anemia   . BREAST CANCER, HX OF 10/27/2006  . Clotting disorder ([place])   . GERD 10/27/2006  . HYPERLIPIDEMIA 10/27/2006  . HYPERTENSION 10/27/2006  . Memory loss   . Migraines   . Organic brain syndrome   . PULMONARY EMBOLISM, HX OF 10/27/2006  . RENAL INSUFFICIENCY 08/01/2009    PAST SURGICAL HISTORY: Past Surgical History:  Procedure Laterality Date  . ABDOMINAL HYSTERECTOMY    . BREAST SURGERY     mastectomy  . CATARACT EXTRACTION Right 01/2016  . CESAREAN SECTION    . CHOLECYSTECTOMY    . KNEE SURGERY     left  . NISSEN FUNDOPLICATION    . TUBAL LIGATION    . VOCAL CORD INJECTION      FAMILY HISTORY: Family History  Problem Relation Age of Onset  . Heart disease Mother   . Heart disease Father   . Cancer Maternal Uncle        leukemia  . Cancer Sister         lung    SOCIAL HISTORY: Social History   Socioeconomic History  . Marital status: Married    Spouse name: Konrad Dolores  . Number of children: 2  . Years of education: BS  . Highest education level: Not on file  Occupational History    Employer: [employer]  Tobacco Use  . Smoking status: Never Smoker  . Smokeless tobacco: Never Used  Substance and Sexual Activity  . Alcohol use: No  . Drug use: No  . Sexual activity: Not on file  Other Topics Concern  . 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   Patient  has two children.   Patient is disabled.   Patient drinks caffeine three to four times per week.   Patient is right-handed.   Patient has a Financial planner.   Social Determinants of Health   Financial Resource Strain:   . Difficulty of Paying Living Expenses: Not on file  Food Insecurity:   . Worried About Charity fundraiser in the Last Year: Not on file  . Ran Out of Food in the Last Year: Not on file  Transportation Needs:   . Lack of Transportation (Medical): Not on file  . Lack of Transportation (Non-Medical): Not on file  Physical Activity:   . Days of Exercise per Week: Not on file  . Minutes of Exercise per Session: Not on file  Stress:   . Feeling of Stress : Not on file  Social Connections:   . Frequency of Communication with Friends and Family: Not on file  . Frequency of Social Gatherings with Friends and Family: Not on file  . Attends Religious Services: Not on file  . Active Member of Clubs or Organizations: Not on file  . Attends Archivist Meetings: Not on file  . Marital Status: Not on file  Intimate Partner Violence:   . Fear of Current or Ex-Partner: Not on file  . Emotionally Abused: Not on file  . Physically Abused: Not on file  . Sexually Abused: Not on file      PHYSICAL EXAM  There were no vitals filed for this visit. There is no height or  weight on file to calculate BMI.  Generalized: Well developed, in no acute distress  Cardiology: normal rate and rhythm, no murmur noted Neurological examination  Mentation: Alert oriented to time, place, history taking. Follows all commands speech and language fluent Cranial nerve II-XII: Pupils were equal round reactive to light. Extraocular movements were full, visual field were full on confrontational test. Facial sensation and strength were normal. Uvula tongue midline. Head turning and shoulder shrug  were normal and symmetric. Motor: The motor testing reveals 5 over 5 strength of all 4 extremities. Good symmetric motor tone is noted throughout.  Sensory: Sensory testing is intact to soft touch on all 4 extremities. No evidence of extinction is noted.  Coordination: Cerebellar testing reveals good finger-nose-finger and heel-to-shin bilaterally.  Gait and station: Gait is normal. Tandem gait is normal. Romberg is negative. No drift is seen.  Reflexes: Deep tendon reflexes are symmetric and normal bilaterally.   DIAGNOSTIC DATA (LABS, IMAGING, TESTING) - I reviewed patient records, labs, notes, testing and imaging myself where available.  No flowsheet data found.   Lab Results  Component Value Date   WBC 8.2 10/23/2016   HGB 12.2 10/23/2016   HCT 36.7 10/23/2016   MCV 90 10/23/2016   PLT 402 (H) 10/23/2016      Component Value Date/Time   NA 141 10/23/2016 1444   NA 141 04/14/2013 0951   K 4.2 10/23/2016 1444   K 4.0 04/14/2013 0951   CL 102 10/23/2016 1444   CO2 23 10/23/2016 1444   CO2 28 04/14/2013 0951   GLUCOSE 90 10/23/2016 1444   GLUCOSE 101 04/14/2013 0951   BUN 21 10/23/2016 1444   BUN 23.6 04/14/2013 0951   CREATININE 0.85 10/23/2016 1444   CREATININE 0.8 04/14/2013 0951   CALCIUM 9.6 10/23/2016 1444   CALCIUM 9.5 04/14/2013 0951   PROT 7.0 10/23/2016 1444   PROT 6.7 04/14/2013 0951   ALBUMIN 4.5 10/23/2016  1444   ALBUMIN 4.0 04/14/2013 0951   AST 26  10/23/2016 1444   AST 18 04/14/2013 0951   ALT 34 (H) 10/23/2016 1444   ALT 24 04/14/2013 0951   ALKPHOS 68 10/23/2016 1444   ALKPHOS 60 04/14/2013 0951   BILITOT <0.2 10/23/2016 1444   BILITOT 0.34 04/14/2013 0951   GFRNONAA 75 10/23/2016 1444   GFRAA 86 10/23/2016 1444   Lab Results  Component Value Date   CHOL 168 02/20/2011   HDL 57.30 02/20/2011   LDLCALC 79 02/20/2011   LDLDIRECT 105.9 07/09/2006   TRIG 161.0 (H) 02/20/2011   CHOLHDL 3 02/20/2011   No results found for: HGBA1C Lab Results  Component Value Date   VITAMINB12 415 05/18/2008   Lab Results  Component Value Date   TSH 1.339 10/24/2010       ASSESSMENT AND PLAN 64 y.o. year old female  has a past medical history of ALLERGIC RHINITIS (10/27/2006), Anemia, BREAST CANCER, HX OF (10/27/2006), Clotting disorder ([place]), GERD (10/27/2006), HYPERLIPIDEMIA (10/27/2006), HYPERTENSION (10/27/2006), Memory loss, Migraines, Organic brain syndrome, PULMONARY EMBOLISM, HX OF (10/27/2006), and RENAL INSUFFICIENCY (08/01/2009). here with ***    ICD-10-CM   1. Migraine with aura and with status migrainosus, not intractable  G43.101   2. Cognitive impairment  R41.89        No orders of the defined types were placed in this encounter.    No orders of the defined types were placed in this encounter.     I spent 15 minutes with the patient. 50% of this time was spent counseling and educating patient on plan of care and medications.    Debbora Presto, FNP-C 05/19/2019, 12:42 PM [facility] [address], [place], [place] [address] [phone]

## 2019-05-19 NOTE — Telephone Encounter (Signed)
Patient was a no call/no show for their appointment today.   

## 2019-08-22 ENCOUNTER — Other Ambulatory Visit: Payer: Self-pay | Admitting: Neurology

## 2019-08-22 DIAGNOSIS — G43101 Migraine with aura, not intractable, with status migrainosus: Secondary | ICD-10-CM

## 2019-08-22 DIAGNOSIS — R4189 Other symptoms and signs involving cognitive functions and awareness: Secondary | ICD-10-CM

## 2019-09-12 ENCOUNTER — Other Ambulatory Visit: Payer: Self-pay | Admitting: Neurology

## 2019-09-12 DIAGNOSIS — R4189 Other symptoms and signs involving cognitive functions and awareness: Secondary | ICD-10-CM

## 2019-09-12 DIAGNOSIS — G43101 Migraine with aura, not intractable, with status migrainosus: Secondary | ICD-10-CM

## 2019-09-22 ENCOUNTER — Encounter: Payer: Self-pay | Admitting: Adult Health

## 2019-09-22 ENCOUNTER — Ambulatory Visit (INDEPENDENT_AMBULATORY_CARE_PROVIDER_SITE_OTHER): Payer: Medicare HMO | Admitting: Adult Health

## 2019-09-22 ENCOUNTER — Other Ambulatory Visit: Payer: Self-pay

## 2019-09-22 DIAGNOSIS — G43101 Migraine with aura, not intractable, with status migrainosus: Secondary | ICD-10-CM

## 2019-09-22 DIAGNOSIS — R4189 Other symptoms and signs involving cognitive functions and awareness: Secondary | ICD-10-CM | POA: Diagnosis not present

## 2019-09-22 MED ORDER — SUMATRIPTAN SUCCINATE 50 MG PO TABS: ORAL_TABLET | ORAL | 11 refills | Status: DC

## 2019-09-22 MED ORDER — VENLAFAXINE HCL ER 75 MG PO CP24
75.0000 mg | ORAL_CAPSULE | Freq: Every day | ORAL | 3 refills | Status: AC
Start: 2019-09-22 — End: ?

## 2019-09-22 MED ORDER — DONEPEZIL HCL 10 MG PO TABS: 10.0000 mg | ORAL_TABLET | Freq: Every day | ORAL | 3 refills | Status: DC

## 2019-09-22 NOTE — Progress Notes (Signed)
PATIENT: Lauren Fuller DOB: [birth year]  REASON FOR VISIT: follow up HISTORY FROM: patient  HISTORY OF PRESENT ILLNESS: Today 09/22/19:  Lauren Fuller is a 64 year old female with a history of migraine headaches and cognitive dysfunction following chemotherapy. Overall she feels that things have remained stable. She states that her cognition has been stable. She states that she goes through phases where she may have delayed responses but overall no new symptoms. She continues on Aricept.  Migraines remain under good control. She reports that she has approximately 4-5 headaches a month. Sumatriptan tends to work well for her. If she has a severe headache she may take oxycodone. She has not refilled oxycodone since last year. She remains on Effexor. She returns today for an evaluation.  HISTORY  05/18/18 Lauren Fuller is a 64 y.o. female here today for follow up for migraines and cognitive dysfunction following chemotherapy. She is taking Aricept 10mg  daily. She feels that cognition is stable. She is still noticing some forgetfulness but overall she is doing well. She has about 5 migraines a month. She feels this is significantly better than her baseline of daily migraines about 6-7 years ago. Botox helped to drop these migraine to about 15-18 per month. She is using sumatriptan as needed for migraine abortion. She does feel that most times this will knock the headache out. She rarely has to take an oxycodone for migraines. She got a dath piercing about 3 years ago. She feels that this has helped her.  She is helping to care for her adult son who is suffering from seizures.  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 She returns today for follow-up. She reports that her headaches remain under good control. She has approximately 5 headaches a month. She typically  can get relief with Amerge but if she has a severe headache that does not respond she will use oxycodone. She states typically her headaches occur on the right side she does have photophobia but denies nausea and vomiting. She feels that her cognition is slightly worse. She states she hasmore of a concentration and focus issue. In the past she has been on Adderall with no noticeable benefit. She is able to complete all ADLs independently. She denies any issues driving. She manages her own finances without difficulty. Denies any change in appetite. She is able to prepare meals without difficulty. She returns today for an evaluation.  REVIEW OF SYSTEMS: Out of a complete 14 system review of symptoms, the patient complains only of the following symptoms, and all other reviewed systems are negative.  See HPI  ALLERGIES: Allergies  Allergen Reactions   Aspirin-Acetaminophen-Caffeine Anaphylaxis    Excedrin - jittery excedrin REACTION: makes me really nervous inside   Butalbital-Aspirin-Caffeine     dizzy   Butalbital-Aspirin-Caffeine     dizzy   Codeine Itching    REACTION: itching   Cyclosporine Other (See Comments) and Itching   Fiorinal P-F W/Codeine  [Butalbital-Asa-Caff-Codeine] Other (See Comments)    FLORINAL 3.- severe sedation   Hydromorphone Hcl     REACTION: not effective   Pentazocine Lactate     REACTION: hallucinations   Pentazocine Lactate     Makes pt feel drunk   Pentazocine Anxiety    hallucinations    HOME MEDICATIONS: Outpatient Medications Prior to Visit  Medication Sig Dispense Refill   ascorbic acid (VITAMIN C) 1000 MG  tablet 1,000 mg.     atorvastatin (LIPITOR) 40 MG tablet TAKE 1 TABLET DAILY 90 tablet 3   donepezil (ARICEPT) 10 MG tablet TAKE 1 TABLET BY MOUTH EVERY DAY 90 tablet 0   Flaxseed, Linseed, (FLAXSEED OIL) 1000 MG CAPS Take by mouth daily.     Folic Acid-Vit T7-GYF V49 (B COMPLEX-FOLIC ACID) 449-6-759 MCG-MG-MCG TABS Take  by mouth.     meloxicam (MOBIC) 7.5 MG tablet Take 7.5 mg by mouth daily.       methocarbamol (ROBAXIN) 500 MG tablet Take 500 mg by mouth every 6 (six) hours as needed for muscle spasms (Every 6-8 hours as needed).      mirtazapine (REMERON SOL-TAB) 30 MG disintegrating tablet DISSOLVE ONE TABLET IN MOUTH AT BEDTIME 90 tablet 1   Multiple Vitamin (MULTIVITAMIN) capsule Take 1 capsule by mouth daily.     Omega-3 Fatty Acids (FISH OIL) 1000 MG CAPS 1 capsule.     omeprazole (PRILOSEC) 40 MG capsule Take 40 mg by mouth daily.     oxyCODONE-acetaminophen (PERCOCET) 10-325 MG tablet Take 1 tablet daily as needed. Do not exceed 2 tablets within 24 hours. 30 tablet 0   Prenatal w/o A Vit-Fe Fum-FA (BP MULTINATAL PLUS) 30-1 MG TABS Take by mouth.     sodium bicarbonate 650 MG tablet TAKE TWO TABLETS (1,300 MG TOTAL) BY MOUTH 2 (TWO) TIMES DAILY.  1   SUMAtriptan (IMITREX) 50 MG tablet TAKE 1 TAB ONSET OF MIGRAINE. MAY REPEAT IN 2 HRS, IF NEEDED. MAX2 TABS/DAY. [place]. 9 tablet 11   venlafaxine XR (EFFEXOR XR) 75 MG 24 hr capsule Take 3 capsules (225 mg total) by mouth daily with breakfast. 270 capsule 0   VITAMIN D, CHOLECALCIFEROL, PO Take 1,000 Units by mouth daily.      Calcium Carbonate-Vitamin D (CALCIUM 600-D) 600-400 MG-UNIT per tablet 1 tablet.     fenofibrate (TRICOR) 145 MG tablet 145 mg.     No facility-administered medications prior to visit.    PAST MEDICAL HISTORY: Past Medical History:  Diagnosis Date   ALLERGIC RHINITIS 10/27/2006   Anemia    BREAST CANCER, HX OF 10/27/2006   Clotting disorder ([place])    GERD 10/27/2006   HYPERLIPIDEMIA 10/27/2006   HYPERTENSION 10/27/2006   Memory loss    Migraines    Organic brain syndrome    PULMONARY EMBOLISM, HX OF 10/27/2006   RENAL INSUFFICIENCY 08/01/2009    PAST SURGICAL HISTORY: Past Surgical History:  Procedure Laterality Date   ABDOMINAL HYSTERECTOMY     BREAST SURGERY     mastectomy    CATARACT EXTRACTION Right 01/2016   CESAREAN SECTION     CHOLECYSTECTOMY     KNEE SURGERY     left   NISSEN FUNDOPLICATION     TUBAL LIGATION     VOCAL CORD INJECTION      FAMILY HISTORY: Family History  Problem Relation Age of Onset   Heart disease Mother    Heart disease Father    Cancer Maternal Uncle        leukemia   Cancer Sister        lung    SOCIAL HISTORY: Social History   Socioeconomic History   Marital status: Married    Spouse name: Tommy   Number of children: 2   Years of education: BS   Highest education level: Not on file  Occupational History    Employer: [employer]  Tobacco Use   Smoking status: Never Smoker  Smokeless tobacco: Never Used  Substance and Sexual Activity   Alcohol use: No   Drug use: No   Sexual activity: Not on file  Other Topics Concern   Not on file  Social History Narrative   Patient is married Psychologist, 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   Patient has two children.   Patient is disabled.   Patient drinks caffeine three to four times per week.   Patient is right-handed.   Patient has a Financial planner.   Social Determinants of Health   Financial Resource Strain:    Difficulty of Paying Living Expenses:   Food Insecurity:    Worried About Charity fundraiser in the Last Year:    Arboriculturist in the Last Year:   Transportation Needs:    Film/video editor (Medical):    Lack of Transportation (Non-Medical):   Physical Activity:    Days of Exercise per Week:    Minutes of Exercise per Session:   Stress:    Feeling of Stress :   Social Connections:    Frequency of Communication with Friends and Family:    Frequency of Social Gatherings with Friends and Family:    Attends Religious Services:    Active Member of Clubs or Organizations:    Attends Archivist Meetings:    Marital Status:   Intimate Partner Violence:    Fear of Current or  Ex-Partner:    Emotionally Abused:    Physically Abused:    Sexually Abused:       PHYSICAL EXAM  Vitals:   09/22/19 0829  BP: 134/84  Pulse: 84  Weight: 163 lb 9.6 oz (74.2 kg)  Height: 5' 4.5" (1.638 m)   Body mass index is 27.65 kg/m. Montreal Cognitive Assessment  04/28/2017 04/24/2016 10/15/2015 04/05/2015  Visuospatial/ Executive (0/5) 5 5 5 5   Naming (0/3) 3 3 3 3   Attention: Read list of digits (0/2) 1 2 1 2   Attention: Read list of letters (0/1) 1 1 1 1   Attention: Serial 7 subtraction starting at 100 (0/3) 3 2 3 3   Language: Repeat phrase (0/2) 2 2 2 2   Language : Fluency (0/1) 1 1 1 1   Abstraction (0/2) 2 2 2 2   Delayed Recall (0/5) 3 4 3 4   Orientation (0/6) 6 6 6 5   Total 27 28 27 28   Adjusted Score (based on education) - - - 28    Generalized: Well developed, in no acute distress   Neurological examination  Mentation: Alert oriented to time, place, history taking. Follows all commands speech and language fluent Cranial nerve II-XII: Pupils were equal round reactive to light. Extraocular movements were full, visual field were full on confrontational test. . Head turning and shoulder shrug  were normal and symmetric. Motor: The motor testing reveals 5 over 5 strength of all 4 extremities. Good symmetric motor tone is noted throughout.  Sensory: Sensory testing is intact to soft touch on all 4 extremities. No evidence of extinction is noted.  Coordination: Cerebellar testing reveals good finger-nose-finger and heel-to-shin bilaterally.  Gait and station: Gait is normal.    DIAGNOSTIC DATA (LABS, IMAGING, TESTING) - I reviewed patient records, labs, notes, testing and imaging myself where available.  Lab Results  Component Value Date   WBC 8.2 10/23/2016   HGB 12.2 10/23/2016   HCT 36.7 10/23/2016   MCV 90 10/23/2016   PLT 402 (H) 10/23/2016      Component Value  Date/Time   NA 141 10/23/2016 1444   NA 141 04/14/2013 0951   K 4.2 10/23/2016 1444   K  4.0 04/14/2013 0951   CL 102 10/23/2016 1444   CO2 23 10/23/2016 1444   CO2 28 04/14/2013 0951   GLUCOSE 90 10/23/2016 1444   GLUCOSE 101 04/14/2013 0951   BUN 21 10/23/2016 1444   BUN 23.6 04/14/2013 0951   CREATININE 0.85 10/23/2016 1444   CREATININE 0.8 04/14/2013 0951   CALCIUM 9.6 10/23/2016 1444   CALCIUM 9.5 04/14/2013 0951   PROT 7.0 10/23/2016 1444   PROT 6.7 04/14/2013 0951   ALBUMIN 4.5 10/23/2016 1444   ALBUMIN 4.0 04/14/2013 0951   AST 26 10/23/2016 1444   AST 18 04/14/2013 0951   ALT 34 (H) 10/23/2016 1444   ALT 24 04/14/2013 0951   ALKPHOS 68 10/23/2016 1444   ALKPHOS 60 04/14/2013 0951   BILITOT <0.2 10/23/2016 1444   BILITOT 0.34 04/14/2013 0951   GFRNONAA 75 10/23/2016 1444   GFRAA 86 10/23/2016 1444   Lab Results  Component Value Date   CHOL 168 02/20/2011   HDL 57.30 02/20/2011   LDLCALC 79 02/20/2011   LDLDIRECT 105.9 07/09/2006   TRIG 161.0 (H) 02/20/2011   CHOLHDL 3 02/20/2011   No results found for: HGBA1C Lab Results  Component Value Date   VITAMINB12 415 05/18/2008   Lab Results  Component Value Date   TSH 1.339 10/24/2010      ASSESSMENT AND PLAN 64 y.o. year old female  has a past medical history of ALLERGIC RHINITIS (10/27/2006), Anemia, BREAST CANCER, HX OF (10/27/2006), Clotting disorder ([place]), GERD (10/27/2006), HYPERLIPIDEMIA (10/27/2006), HYPERTENSION (10/27/2006), Memory loss, Migraines, Organic brain syndrome, PULMONARY EMBOLISM, HX OF (10/27/2006), and RENAL INSUFFICIENCY (08/01/2009). here with :  Migraine headaches   Continue Effexor and sumatriptan  Has prescription for oxycodone for severe headache  Pain contract signed today  Cognitive dysfunction   Stable  Continue Aricept   I spent 20 minutes of face-to-face and non-face-to-face time with patient.  This included previsit chart review, lab review, study review, order entry, electronic health record documentation, patient education.  Ward Givens, MSN, NP-C  09/22/2019, 8:36 AM [facility] [address], [place], [place] [address] [phone]

## 2019-09-22 NOTE — Patient Instructions (Signed)
Your Plan:  Continue effexor, sumatriptan and aricept  Pain contract signed If your symptoms worsen or you develop new symptoms please let us know.       Thank you for coming to see [place] at [facility]. I hope we have been able to provide you high quality care today.  You may receive a patient satisfaction survey over the next few weeks. We would appreciate your feedback and comments so that we may continue to improve ourselves and the health of our patients.

## 2019-11-07 ENCOUNTER — Other Ambulatory Visit: Payer: Self-pay | Admitting: Adult Health

## 2019-11-07 DIAGNOSIS — G43101 Migraine with aura, not intractable, with status migrainosus: Secondary | ICD-10-CM

## 2019-11-07 MED ORDER — OXYCODONE-ACETAMINOPHEN 10-325 MG PO TABS: ORAL_TABLET | ORAL | 0 refills | Status: DC

## 2019-11-07 NOTE — Addendum Note (Signed)
Addended by: Brandon Melnick on: 11/07/2019 04:20 PM   Modules accepted: Orders

## 2019-11-07 NOTE — Telephone Encounter (Signed)
Pt is requesting a refill for oxyCODONE-acetaminophen (PERCOCET) 10-325 MG tablet.  Pharmacy: [id number]

## 2020-04-13 ENCOUNTER — Emergency Department (HOSPITAL_COMMUNITY)
Admission: EM | Admit: 2020-04-13 | Discharge: 2020-04-13 | Disposition: A | Discharge status: Home / Self Care | Payer: Medicare HMO | Attending: Emergency Medicine | Admitting: Emergency Medicine

## 2020-04-13 ENCOUNTER — Other Ambulatory Visit: Payer: Self-pay

## 2020-04-13 ENCOUNTER — Encounter (HOSPITAL_COMMUNITY): Payer: Self-pay | Admitting: *Deleted

## 2020-04-13 ENCOUNTER — Emergency Department (HOSPITAL_COMMUNITY): Payer: Medicare HMO

## 2020-04-13 DIAGNOSIS — Z20822 Contact with and (suspected) exposure to covid-19: Secondary | ICD-10-CM | POA: Diagnosis not present

## 2020-04-13 DIAGNOSIS — R079 Chest pain, unspecified: Secondary | ICD-10-CM | POA: Insufficient documentation

## 2020-04-13 DIAGNOSIS — I10 Essential (primary) hypertension: Secondary | ICD-10-CM | POA: Insufficient documentation

## 2020-04-13 DIAGNOSIS — R110 Nausea: Secondary | ICD-10-CM | POA: Insufficient documentation

## 2020-04-13 DIAGNOSIS — F0390 Unspecified dementia without behavioral disturbance: Secondary | ICD-10-CM | POA: Insufficient documentation

## 2020-04-13 DIAGNOSIS — R61 Generalized hyperhidrosis: Secondary | ICD-10-CM | POA: Insufficient documentation

## 2020-04-13 DIAGNOSIS — Z853 Personal history of malignant neoplasm of breast: Secondary | ICD-10-CM | POA: Insufficient documentation

## 2020-04-13 DIAGNOSIS — K219 Gastro-esophageal reflux disease without esophagitis: Secondary | ICD-10-CM | POA: Diagnosis not present

## 2020-04-13 DIAGNOSIS — R1013 Epigastric pain: Secondary | ICD-10-CM | POA: Insufficient documentation

## 2020-04-13 LAB — COMPREHENSIVE METABOLIC PANEL
ALT: 53 U/L — ABNORMAL HIGH (ref 0–44)
AST: 49 U/L — ABNORMAL HIGH (ref 15–41)
Albumin: 3.7 g/dL (ref 3.5–5.0)
Alkaline Phosphatase: 62 U/L (ref 38–126)
Anion gap: 14 (ref 5–15)
BUN: 29 mg/dL — ABNORMAL HIGH (ref 8–23)
CO2: 22 mmol/L (ref 22–32)
Calcium: 9 mg/dL (ref 8.9–10.3)
Chloride: 100 mmol/L (ref 98–111)
Creatinine, Ser: 0.78 mg/dL (ref 0.44–1.00)
GFR, Estimated: >60 mL/min (ref >60)
Glucose, Bld: 104 mg/dL — ABNORMAL HIGH (ref 70–99)
Potassium: 3.5 mmol/L (ref 3.5–5.1)
Sodium: 136 mmol/L (ref 135–145)
Total Bilirubin: 0.4 mg/dL (ref 0.3–1.2)
Total Protein: 6.5 g/dL (ref 6.5–8.1)

## 2020-04-13 LAB — URINALYSIS, ROUTINE W REFLEX MICROSCOPIC
Glucose, UA: NEGATIVE mg/dL
Hgb urine dipstick: NEGATIVE
Ketones, ur: NEGATIVE mg/dL
Nitrite: NEGATIVE
Protein, ur: NEGATIVE mg/dL
Specific Gravity, Urine: 1.026 (ref 1.005–1.030)
pH: 5 (ref 5.0–8.0)

## 2020-04-13 LAB — CBC
HCT: 37.5 % (ref 36.0–46.0)
Hemoglobin: 12.5 g/dL (ref 12.0–15.0)
MCH: 30.6 pg (ref 26.0–34.0)
MCHC: 33.3 g/dL (ref 30.0–36.0)
MCV: 91.9 fL (ref 80.0–100.0)
Platelets: 355 10*3/uL (ref 150–400)
RBC: 4.08 MIL/uL (ref 3.87–5.11)
RDW: 13.2 % (ref 11.5–15.5)
WBC: 10.4 10*3/uL (ref 4.0–10.5)
nRBC: 0 % (ref 0.0–0.2)

## 2020-04-13 LAB — LIPASE, BLOOD: Lipase: 72 U/L — ABNORMAL HIGH (ref 11–51)

## 2020-04-13 LAB — D-DIMER, QUANTITATIVE: D-Dimer, Quant: 0.43 ug/mL-FEU (ref 0.00–0.50)

## 2020-04-13 LAB — TROPONIN I (HIGH SENSITIVITY)
Troponin I (High Sensitivity): 3 ng/L (ref <18)
Troponin I (High Sensitivity): 3 ng/L (ref <18)

## 2020-04-13 LAB — SARS CORONAVIRUS 2 BY RT PCR (HOSPITAL ORDER, PERFORMED IN ~~LOC~~ HOSPITAL LAB): SARS Coronavirus 2: NEGATIVE

## 2020-04-13 MED ORDER — IOHEXOL 300 MG/ML  SOLN
100.0000 mL | Freq: Once | INTRAMUSCULAR | Status: AC | PRN
  Administered 2020-04-13: 100 mL via INTRAVENOUS

## 2020-04-13 NOTE — Discharge Instructions (Addendum)
You were seen in the emergency department for upper abdominal/lower chest pain  Lab work, imaging was all normal.  The cause of your pain is unclear.  I discussed the possibility of a GI cause like acid reflux, gastritis, indigestion  Continue all your medicines  Monitor your symptoms  Follow-up with your primary care doctor if your symptoms continue  Return to the ED for worsening or new symptoms, chest pain or shortness of breath with exertion, fever, urinary symptoms, diarrhea

## 2020-04-13 NOTE — ED Triage Notes (Signed)
The pt arrived by gems from homewith a 15 minute history of epigastric pain and or chest pain  The pain lasted approx 20 minutes no pain now.  Ems gave 4 mg of zofran and 324mg  os aspirin  Iv per ems she had a rapid covid test Friday that was negative

## 2020-04-13 NOTE — ED Provider Notes (Signed)
[facility] EMERGENCY DEPARTMENT Provider Note   CSN: [id number] Arrival date & time: 04/13/20  3500     History Chief Complaint  Patient presents with  . Chest Pain    Lauren Fuller is a 65 y.o. female with past medical history of hypertension, hyperlipidemia, Covid December 2020, right PE s/p oral anticoagulation, breast cancer s/p bilateral mastectomy, GERD presents to the ED for evaluation of pain in her epigastric abdominal region that radiates underneath her chest bone.  This began suddenly while she was sitting down sewing at around 3 PM.  Described as sudden and severe.  It made her nauseous and break out in a sweat.  Felt tingling in all of her fingertips bilaterally that extended up into both elbows.  This lasted several minutes.  There is no radiation of the pain into her neck, back or abdomen.  There was no extremity pain.  By the time EMS was transporting patient to the ED her symptoms completely resolved.  She feels back to normal now.  Was given 4 baby aspirin and Zofran on route.  Reports exposure to Covid recently.  States after having Covid in 2020 patient decided not to get the vaccine.  Denies any Covid symptoms.  Denies fever, cough, shortness of breath.  No longer having any chest pain.  No vomiting, abdominal pain, diarrhea.  No dysuria, hematuria.  Reports her GERD is well controlled and has been compliant on her omeprazole.  Reports having some type of surgery in her esophagus for acid reflux many years ago.  S/p cholecystectomy.  No history of pancreatitis.  History of ulcers.  No use of alcohol or frequent use of NSAIDs.  Reports her blood pressure is well controlled now that she is on blood pressure medicine.  Has had a migraine for the last 2 days but states she has history of migraines. No longer on [place], finished 2002.   HPI     Past Medical History:  Diagnosis Date  . ALLERGIC RHINITIS 10/27/2006  . Anemia   . BREAST CANCER, HX OF 10/27/2006  .  Clotting disorder ([place])   . GERD 10/27/2006  . HYPERLIPIDEMIA 10/27/2006  . HYPERTENSION 10/27/2006  . Memory loss   . Migraines   . Organic brain syndrome   . PULMONARY EMBOLISM, HX OF 10/27/2006  . RENAL INSUFFICIENCY 08/01/2009    Patient Active Problem List   Diagnosis Date Noted  . 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 History of breast cancer in female 04/05/2015  . Migraine with aura and with status migrainosus, not intractable 04/05/2015  . Cognitive impairment 10/04/2014  . 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 Snoring 09/22/2013  . Urticaria of unknown origin 09/22/2013  . Insomnia due to psychological stress 09/22/2013  . MCI (mild cognitive impairment) with memory loss 09/22/2013  . LLQ pain 04/18/2011  . RENAL INSUFFICIENCY 08/01/2009  . HYPERLIPIDEMIA 10/27/2006  . HYPERTENSION 10/27/2006  . ALLERGIC RHINITIS 10/27/2006  . GERD 10/27/2006  . BREAST CANCER, HX OF 10/27/2006  . PULMONARY EMBOLISM, HX OF 10/27/2006    Past Surgical History:  Procedure Laterality Date  . ABDOMINAL HYSTERECTOMY    . BREAST SURGERY     mastectomy  . CATARACT EXTRACTION Right 01/2016  . CESAREAN SECTION    . CHOLECYSTECTOMY    . KNEE SURGERY     left  . NISSEN FUNDOPLICATION    . TUBAL LIGATION    . VOCAL CORD INJECTION  OB History   No obstetric history on file.     Family History  Problem Relation Age of Onset  . Heart disease Mother   . Heart disease Father   . Cancer Maternal Uncle        leukemia  . Cancer Sister        lung    Social History   Tobacco Use  . Smoking status: Never Smoker  . Smokeless tobacco: Never Used  Substance Use Topics  . Alcohol use: No  . Drug use: No    Home Medications Prior to Admission medications   Medication Sig Start Date End Date Taking? Authorizing Provider  ascorbic acid (VITAMIN C) 1000 MG tablet 1,000 mg.    [provider]  atorvastatin (LIPITOR) 40 MG tablet TAKE 1 TABLET  DAILY 03/31/11   Marletta Lor, MD  Calcium Carbonate-Vitamin D (CALCIUM 600-D) 600-400 MG-UNIT per tablet 1 tablet. 08/18/12 04/28/17  [provider]  donepezil (ARICEPT) 10 MG tablet Take 1 tablet (10 mg total) by mouth daily. 09/22/19   Ward Givens, NP  fenofibrate (TRICOR) 145 MG tablet 145 mg. 02/25/13 04/28/17  [provider]  Flaxseed, Linseed, (FLAXSEED OIL) 1000 MG CAPS Take by mouth daily.    [provider]  Folic Acid-Vit G8-QPY P95 (B COMPLEX-FOLIC ACID) 093-2-671 MCG-MG-MCG TABS Take by mouth.    [provider]  meloxicam (MOBIC) 7.5 MG tablet Take 7.5 mg by mouth daily.      [provider]  methocarbamol (ROBAXIN) 500 MG tablet Take 500 mg by mouth every 6 (six) hours as needed for muscle spasms (Every 6-8 hours as needed).     [provider]  mirtazapine (REMERON SOL-TAB) 30 MG disintegrating tablet DISSOLVE ONE TABLET IN MOUTH AT BEDTIME 12/15/16   Dohmeier, Asencion Partridge, MD  Multiple Vitamin (MULTIVITAMIN) capsule Take 1 capsule by mouth daily.    [provider]  Omega-3 Fatty Acids (FISH OIL) 1000 MG CAPS 1 capsule.    [provider]  omeprazole (PRILOSEC) 40 MG capsule Take 40 mg by mouth daily.    [provider]  oxyCODONE-acetaminophen (PERCOCET) 10-325 MG tablet Take 1 tablet daily as needed. Do not exceed 2 tablets within 24 hours. 11/07/19   Dohmeier, Asencion Partridge, MD  Prenatal w/o A Vit-Fe Fum-FA (BP MULTINATAL PLUS) 30-1 MG TABS Take by mouth.    [provider]  sodium bicarbonate 650 MG tablet TAKE TWO TABLETS (1,300 MG TOTAL) BY MOUTH 2 (TWO) TIMES DAILY. 10/02/16   [provider]  SUMAtriptan (IMITREX) 50 MG tablet May repeat in 2 hours if headache persists or recurs. 09/22/19   Ward Givens, NP  venlafaxine XR (EFFEXOR XR) 75 MG 24 hr capsule Take 1 capsule (75 mg total) by mouth daily with breakfast. 09/22/19   Ward Givens, NP  VITAMIN D, CHOLECALCIFEROL, PO Take 1,000  Units by mouth daily.     [provider]    Allergies    Aspirin-acetaminophen-caffeine, Acetaminophen-caffeine, Butalbital-aspirin-caffeine, Butalbital-aspirin-caffeine, Codeine, Cyclosporine, Fiorinal p-f w/codeine  [butalbital-asa-caff-codeine], Hydromorphone hcl, Other, Pentazocine lactate, Pentazocine lactate, and Pentazocine  Review of Systems   Review of Systems  Constitutional: Positive for diaphoresis.  Cardiovascular: Positive for chest pain (epigastric).  Gastrointestinal: Positive for abdominal pain (epigastric) and nausea.  Neurological: Positive for headaches.       Paresthesias fingers/hands/elbows     Physical Exam Updated Vital Signs BP 122/78   Pulse 94   Temp 98 F (36.7 C) (Oral)   Resp 20  Ht 5' 4.5" (1.638 m)   Wt 77.1 kg   SpO2 97%   BMI 28.73 kg/m   Physical Exam Constitutional:      Appearance: She is well-developed.     Comments: NAD. Non toxic.   HENT:     Head: Normocephalic and atraumatic.     Nose: Nose normal.  Eyes:     General: Lids are normal.     Conjunctiva/sclera: Conjunctivae normal.  Neck:     Trachea: Trachea normal.     Comments: Trachea midline.  Cardiovascular:     Rate and Rhythm: Normal rate and regular rhythm.     Pulses:          Radial pulses are 1+ on the right side and 1+ on the left side.       Dorsalis pedis pulses are 1+ on the right side and 1+ on the left side.     Heart sounds: Normal heart sounds, S1 normal and S2 normal.     Comments: No murmurs. No LE edema or calf tenderness.  Pulmonary:     Effort: Pulmonary effort is normal.     Breath sounds: Normal breath sounds.  Abdominal:     General: Bowel sounds are normal.     Palpations: Abdomen is soft.     Tenderness: There is no abdominal tenderness.     Comments: No epigastric or upper abdominal tenderness.  Musculoskeletal:     Cervical back: Normal range of motion.  Skin:    General: Skin is warm and dry.     Capillary Refill: Capillary  refill takes less than 2 seconds.     Comments: No rash to chest wall. Surgical scars bilateral breast noted   Neurological:     Mental Status: She is alert.     GCS: GCS eye subscore is 4. GCS verbal subscore is 5. GCS motor subscore is 6.     Comments: Sensation and strength intact in upper/lower extremities  Psychiatric:        Speech: Speech normal.        Behavior: Behavior normal.        Thought Content: Thought content normal.     ED Results / Procedures / Treatments   Labs (all labs ordered are listed, but only abnormal results are displayed) Labs Reviewed  COMPREHENSIVE METABOLIC PANEL - Abnormal; Notable for the following components:      Result Value   Glucose, Bld 104 (*)    BUN 29 (*)    AST 49 (*)    ALT 53 (*)    All other components within normal limits  LIPASE, BLOOD - Abnormal; Notable for the following components:   Lipase 72 (*)    All other components within normal limits  URINALYSIS, ROUTINE W REFLEX MICROSCOPIC - Abnormal; Notable for the following components:   APPearance HAZY (*)    Bilirubin Urine SMALL (*)    Leukocytes,Ua TRACE (*)    Bacteria, UA RARE (*)    All other components within normal limits  SARS CORONAVIRUS 2 BY RT PCR (HOSPITAL ORDER, [facility])  CBC  D-DIMER, QUANTITATIVE (NOT AT [facility])  TROPONIN I (HIGH SENSITIVITY)  TROPONIN I (HIGH SENSITIVITY)    EKG EKG Interpretation  Date/Time:  Friday April 13 2020 16:41:14 EST Ventricular Rate:  79 PR Interval:    QRS Duration: 96 QT Interval:  415 QTC Calculation: 476 R Axis:   2 Text Interpretation: Sinus rhythm Abnormal R-wave progression, early  transition Abnormal T, consider ischemia, anterior leads No significant change since last tracing Confirmed by Isla Pence [phone]) on 04/13/2020 4:47:57 PM   Radiology CT ABDOMEN PELVIS W CONTRAST  Result Date: 04/13/2020 CLINICAL DATA:  Epigastric abdominal pain.  Lipase 72. EXAM: CT ABDOMEN AND PELVIS  WITH CONTRAST TECHNIQUE: Multidetector CT imaging of the abdomen and pelvis was performed using the standard protocol following bolus administration of intravenous contrast. CONTRAST:  141mL OMNIPAQUE IOHEXOL 300 MG/ML  SOLN COMPARISON:  CT 04/23/2011 FINDINGS: Lower chest: Minor subsegmental atelectasis in both lower lobes. No confluent consolidation or pleural fluid. Heart size normal. Left breast implant. Hepatobiliary: Diffusely decreased hepatic density consistent with steatosis. No focal hepatic lesion. Clips in the gallbladder fossa postcholecystectomy. No biliary dilatation. Dropped clip adjacent to the medial right hepatic lobe without associated fluid collection. This is unchanged. Pancreas: No ductal dilatation or inflammation. No peripancreatic fat stranding or inflammation. No evidence of pancreatic mass. Spleen: Normal in size without focal abnormality. Adrenals/Urinary Tract: Normal adrenal glands. No hydronephrosis or perinephric edema. Homogeneous renal enhancement with symmetric excretion on delayed phase imaging. Tiny cortical low densities within both kidneys are too small to accurately characterize. Urinary bladder is partially distended without wall thickening. Stomach/Bowel: Prior Nissen fundoplication. Similar appearance of the fundoplication wrap. No gastric wall thickening. Normal positioning of the duodenum and ligament of Treitz. No small bowel obstruction or inflammation. Punctate hyperdensity at the base of the appendix may represent appendicular lith for residual enteric contrast from prior radiologic exam. The appendix is otherwise normal without abnormal distension or periappendiceal fat stranding. Moderate colonic stool burden. Diverticulosis of the descending and sigmoid colon without acute diverticulitis. No colonic wall thickening or inflammation. Vascular/Lymphatic: Mild aortic atherosclerosis. No aortic aneurysm. Patent portal vein. No enlarged lymph nodes in the abdomen or  pelvis. Reproductive: Status post hysterectomy. No adnexal masses. Other: No free air, free fluid, or intra-abdominal fluid collection. Surgical clips involving the lower anterior abdominal wall, unchanged from prior. Musculoskeletal: There are no acute or suspicious osseous abnormalities. Degenerative disc disease at L5-S1. IMPRESSION: 1. No acute abnormality in the abdomen/pelvis. No CT findings of pancreatitis. 2. Prior Nissen fundoplication. No evidence of complication. 3. Colonic diverticulosis without acute diverticulitis. 4. Hepatic steatosis. Aortic Atherosclerosis (ICD10-I70.0). Electronically Signed   By: Keith Rake M.D.   On: 04/13/2020 19:51   DG Chest Port 1 View  Result Date: 04/13/2020 CLINICAL DATA:  Epigastric abdominal pain.  Chest pain. EXAM: PORTABLE CHEST 1 VIEW COMPARISON:  02/17/2006 and CT chest from 04/15/2013 FINDINGS: The heart size and mediastinal contours are within normal limits. Both lungs are clear. The visualized skeletal structures are unremarkable. IMPRESSION: No active disease. Electronically Signed   By: Van Clines M.D.   On: 04/13/2020 17:09    Procedures Procedures   Medications Ordered in ED Medications  iohexol (OMNIPAQUE) 300 MG/ML solution 100 mL (100 mLs Intravenous Contrast Given 04/13/20 1938)    ED Course  I have reviewed the triage vital signs and the nursing notes.  Pertinent labs & imaging results that were available during my care of the patient were reviewed by me and considered in my medical decision making (see chart for details).  Clinical Course as of 04/13/20 2154  Fri Apr 13, 2020  1756 EKG 12-Lead EKG reviewed with EDP, TWI in anterior leads not new  [CG]  1914 Lipase(!): 72 [CG]  1914 AST(!): 49 [CG]  1914 ALT(!): 53 [CG]  1914 Troponin I (High Sensitivity): 3 [CG]  1914 D-Dimer, Quant:  0.43 [CG]  2128 I have reevaluated the patient.  Remains asymptomatic since ED arrival.  Vital signs are completely normal.  Repeat  abdominal exam without any tenderness.  Reviewed lab work, imaging results with patient and husband.  Pending troponin, anticipate discharge. [CG]    Clinical Course User Index [CG] Arlean Hopping   MDM Rules/Calculators/A&P                           65 y.o. yo with chief complaint of epigastric abdominal pain/chest pain.  Sudden onset at 3 PM.  Associated with diaphoresis, nausea, bilateral upper extremity tingling.  Symptoms have completely resolved on arrival to the ED.  History of breast cancer s/p bilateral mastectomies and reconstructive surgery, right PE s/p OAC no longer on anticoagulation, acid reflux and esophagus surgery for acid reflux.  Possible exposure to Covid recently.  Unvaccinated.  Previous medical records available, triage and nursing notes reviewed to obtain more history and assist with MDM  Additional information obtained from EMS paramedic.  Patient received 4 baby aspirin and Zofran on route.  EKG leads show T wave inversions in anterior leads.  Chief complain involves an extensive number of treatment options and is a complaint that carries with it a high risk of complications and morbidity and mortality.    Differential diagnosis: Cardiac etiology like ACS, pericarditis, myocarditis.  Also considering GI etiology like acid reflux/gastritis/ulcer, pancreatitis.  S/p cholecystectomy.  Considering dissection as well although less likely given complete resolution of symptoms, normal blood pressure and normal distal pulses and neuro exam here.  ER lab work and imaging ordered by triage RN and me, as above  I have personally visualized and interpreted ER diagnostic work up including labs and imaging.    Labs reveal -vastly reassuring.  AST and ALT minimally elevated.  Lipase 72.  Urinalysis with rare bacteria, trace leukocytes.  She has no UTI symptoms, suprapubic or CVA tenderness.  Troponin x2 undetectable.  D-dimer negative.    Imaging reveals -EKG has T  wave inversions in anterior leads but these are similar to previous.  Chest x-ray normal without edema, opacities, widened mediastinum.  CT A/P without acute findings, pancreas appears normal no complicating features of Nissen surgery.  Medications ordered -patient has been asymptomatic while in the ED, no medicines ordered.  Ordered continuous cardiac and pulse ox monitoring.  Will plan for serial re-examinations. Close monitoring.   2155: Patient has remained asymptomatic in the ED.  Reevaluated several times and no acute changes.  Cause of pain is unclear at this time but history, work-up is not consistent with PE. HEART score is 4-5, however patient's symptoms are very atypical and ACS is considered unlikely. Covid is pending.  Had exposure 1 week ago, unvaccinated however no other symptoms of COVID. ?GI cause.   Final Clinical Impression(s) / ED Diagnoses Final diagnoses:  Epigastric abdominal pain    Rx / DC Orders ED Discharge Orders    None       Arlean Hopping 04/13/20 2154    Isla Pence, MD 04/13/20 2220

## 2020-04-20 ENCOUNTER — Telehealth: Payer: Self-pay | Admitting: Internal Medicine

## 2020-04-20 NOTE — Telephone Encounter (Signed)
Ok to schedule with me. Thanks

## 2020-04-20 NOTE — Telephone Encounter (Signed)
Hi Dr. Henrene Pastor, this pt used to see you back in 2011. She then transfered to [facility] for treatment of anal skin tag. Pt states that she would like to continue her GI care with you as she is overdue for her colonoscopy. Please advise if it is ok to schedule pt with you. Thank you.

## 2020-04-27 NOTE — Telephone Encounter (Signed)
Left message to call back  

## 2020-05-31 ENCOUNTER — Other Ambulatory Visit: Payer: Self-pay | Admitting: Adult Health

## 2020-05-31 DIAGNOSIS — G43101 Migraine with aura, not intractable, with status migrainosus: Secondary | ICD-10-CM

## 2020-05-31 MED ORDER — OXYCODONE-ACETAMINOPHEN 10-325 MG PO TABS: ORAL_TABLET | ORAL | 0 refills | Status: DC

## 2020-05-31 NOTE — Addendum Note (Signed)
Addended by: Minna Antis on: 05/31/2020 03:57 PM   Modules accepted: Orders

## 2020-05-31 NOTE — Telephone Encounter (Signed)
Pt has scheduled her annual f/u and she is being told by pharmacy to call office and send in refill request for oxyCODONE-acetaminophen (PERCOCET) 10-325 MG tablet to [id number]

## 2020-06-12 ENCOUNTER — Encounter: Payer: Medicare HMO | Admitting: Internal Medicine

## 2020-06-21 ENCOUNTER — Other Ambulatory Visit: Payer: Self-pay

## 2020-06-21 ENCOUNTER — Ambulatory Visit (AMBULATORY_SURGERY_CENTER): Payer: Self-pay

## 2020-06-21 VITALS — Ht 64.5 in | Wt 172.0 lb

## 2020-06-21 DIAGNOSIS — Z1211 Encounter for screening for malignant neoplasm of colon: Secondary | ICD-10-CM

## 2020-06-21 MED ORDER — SUTAB 1479-225-188 MG PO TABS: 1.0000 | ORAL_TABLET | ORAL | 0 refills | Status: DC

## 2020-06-21 NOTE — Progress Notes (Signed)
No egg or soy allergy known to patient  No issues with past sedation with any surgeries or procedures Patient denies ever being told they had issues or difficulty with intubation  No FH of Malignant Hyperthermia No diet pills per patient No home 02 use per patient  No blood thinners per patient  Pt denies issues with constipation  No A fib or A flutter  COVID 19 guidelines implemented in PV today with Pt and RN  Coupon given to pt in PV today, Code to Pharmacy and  NO PA's for preps discussed with pt in PV today  Discussed with pt there will be an out-of-pocket cost for prep and that varies from $0 to 70 dollars  Due to the COVID-19 pandemic we are asking patients to follow certain guidelines.  Pt aware of COVID protocols and [facility] guidelines

## 2020-07-05 ENCOUNTER — Ambulatory Visit (AMBULATORY_SURGERY_CENTER): Payer: Medicare HMO | Admitting: Internal Medicine

## 2020-07-05 ENCOUNTER — Other Ambulatory Visit: Payer: Self-pay

## 2020-07-05 ENCOUNTER — Encounter: Payer: Self-pay | Admitting: Internal Medicine

## 2020-07-05 VITALS — BP 152/79 | HR 82 | Temp 98.4°F | Resp 13 | Ht 64.5 in | Wt 172.0 lb

## 2020-07-05 DIAGNOSIS — D123 Benign neoplasm of transverse colon: Secondary | ICD-10-CM

## 2020-07-05 DIAGNOSIS — D120 Benign neoplasm of cecum: Secondary | ICD-10-CM

## 2020-07-05 DIAGNOSIS — Z1211 Encounter for screening for malignant neoplasm of colon: Secondary | ICD-10-CM

## 2020-07-05 MED ORDER — SODIUM CHLORIDE 0.9 % IV SOLN: 500.0000 mL | Freq: Once | INTRAVENOUS | Status: AC

## 2020-07-05 NOTE — Progress Notes (Signed)
Medical history reviewed with no changes noted. VS assessed by C.W 

## 2020-07-05 NOTE — Progress Notes (Signed)
Pt Drowsy. VSS. To PACU, report to RN. No anesthetic complications noted.  

## 2020-07-05 NOTE — Op Note (Signed)
[place] Patient Name: Lauren Fuller Procedure Date: 07/05/2020 11:34 AM MRN: [id number] Endoscopist: Docia Chuck. Henrene Pastor , MD Age: 65 Referring MD:  Date of Birth: [birth year] Gender: Female Account #: [id number] Procedure:                Colonoscopy with cold snare polypectomy x 2 Indications:              Screening for colorectal malignant neoplasm.                            Previous examinations 2001, 2011 were negative for                            neoplasia Medicines:                Monitored Anesthesia Care Procedure:                Pre-Anesthesia Assessment:                           - Prior to the procedure, a History and Physical                            was performed, and patient medications and                            allergies were reviewed. The patient's tolerance of                            previous anesthesia was also reviewed. The risks                            and benefits of the procedure and the sedation                            options and risks were discussed with the patient.                            All questions were answered, and informed consent                            was obtained. Prior Anticoagulants: The patient has                            taken no previous anticoagulant or antiplatelet                            agents. ASA Grade Assessment: II - A patient with                            mild systemic disease. After reviewing the risks                            and benefits, the patient was deemed in  satisfactory condition to undergo the procedure.                           After obtaining informed consent, the colonoscope                            was passed under direct vision. Throughout the                            procedure, the patient's blood pressure, pulse, and                            oxygen saturations were monitored continuously. The                            Olympus CF-HQ190L  (Serial# [id number]) Colonoscope was                            introduced through the anus and advanced to the the                            cecum, identified by appendiceal orifice and                            ileocecal valve. The ileocecal valve, appendiceal                            orifice, and rectum were photographed. The quality                            of the bowel preparation was excellent. The                            colonoscopy was performed without difficulty. The                            patient tolerated the procedure well. The bowel                            preparation used was SUPREP via split dose                            instruction. Scope In: 11:42:27 AM Scope Out: 11:53:58 AM Scope Withdrawal Time: 0 hours 8 minutes 30 seconds  Total Procedure Duration: 0 hours 11 minutes 31 seconds  Findings:                 Two polyps were found in the transverse colon and                            cecum. The polyps were 1 to 3 mm in size. These                            polyps were removed with a cold snare. Resection  and retrieval were complete.                           Multiple diverticula were found in the sigmoid                            colon.                           The exam was otherwise without abnormality on                            direct and retroflexion views. Complications:            No immediate complications. Estimated blood loss:                            None. Estimated Blood Loss:     Estimated blood loss: none. Impression:               - Two 1 to 3 mm polyps in the transverse colon and                            in the cecum, removed with a cold snare. Resected                            and retrieved.                           - Diverticulosis in the sigmoid colon.                           - The examination was otherwise normal on direct                            and retroflexion views. Recommendation:            - Repeat colonoscopy in 7 years for surveillance.                           - Patient has a contact number available for                            emergencies. The signs and symptoms of potential                            delayed complications were discussed with the                            patient. Return to normal activities tomorrow.                            Written discharge instructions were provided to the                            patient.                           -  Resume previous diet.                           - Continue present medications.                           - Await pathology results. Docia Chuck. Henrene Pastor, MD 07/05/2020 12:00:26 PM This report has been signed electronically.

## 2020-07-05 NOTE — Progress Notes (Signed)
Called to room to assist during endoscopic procedure.  Patient ID and intended procedure confirmed with present staff. Received instructions for my participation in the procedure from the performing physician.  

## 2020-07-05 NOTE — Patient Instructions (Signed)
Repeat colonoscopy in 7 years for surveillance. Resume previous diet, continue present medications. Awaiting pathology results.  YOU HAD AN ENDOSCOPIC PROCEDURE TODAY AT [facility]:   Refer to the procedure report that was given to you for any specific questions about what was found during the examination.  If the procedure report does not answer your questions, please call your gastroenterologist to clarify.  If you requested that your care partner not be given the details of your procedure findings, then the procedure report has been included in a sealed envelope for you to review at your convenience later.  YOU SHOULD EXPECT: Some feelings of bloating in the abdomen. Passage of more gas than usual.  Walking can help get rid of the air that was put into your GI tract during the procedure and reduce the bloating. If you had a lower endoscopy (such as a colonoscopy or flexible sigmoidoscopy) you may notice spotting of blood in your stool or on the toilet paper. If you underwent a bowel prep for your procedure, you may not have a normal bowel movement for a few days.  Please Note:  You might notice some irritation and congestion in your nose or some drainage.  This is from the oxygen used during your procedure.  There is no need for concern and it should clear up in a day or so.  SYMPTOMS TO REPORT IMMEDIATELY:   Following lower endoscopy (colonoscopy or flexible sigmoidoscopy):  Excessive amounts of blood in the stool  Significant tenderness or worsening of abdominal pains  Swelling of the abdomen that is new, acute  Fever of 100F or higher   For urgent or emergent issues, a gastroenterologist can be reached at any hour by calling [phone]. Do not use MyChart messaging for urgent concerns.    DIET:  We do recommend a small meal at first, but then you may proceed to your regular diet.  Drink plenty of fluids but you should avoid alcoholic beverages for 24  hours.  ACTIVITY:  You should plan to take it easy for the rest of today and you should NOT DRIVE or use heavy machinery until tomorrow (because of the sedation medicines used during the test).    FOLLOW UP: Our staff will call the number listed on your records 48-72 hours following your procedure to check on you and address any questions or concerns that you may have regarding the information given to you following your procedure. If we do not reach you, we will leave a message.  We will attempt to reach you two times.  During this call, we will ask if you have developed any symptoms of COVID 19. If you develop any symptoms (ie: fever, flu-like symptoms, shortness of breath, cough etc.) before then, please call [phone].  If you test positive for Covid 19 in the 2 weeks post procedure, please call and report this information to [place].    If any biopsies were taken you will be contacted by phone or by letter within the next 1-3 weeks.  Please call us at [phone] if you have not heard about the biopsies in 3 weeks.    SIGNATURES/CONFIDENTIALITY: You and/or your care partner have signed paperwork which will be entered into your electronic medical record.  These signatures attest to the fact that that the information above on your After Visit Summary has been reviewed and is understood.  Full responsibility of the confidentiality of this discharge information lies with you and/or your care-partner.

## 2020-07-09 ENCOUNTER — Telehealth: Payer: Self-pay

## 2020-07-09 NOTE — Telephone Encounter (Signed)
  Follow up Call-  Call back number 07/05/2020  Post procedure Call Back phone  # [phone]  Permission to leave phone message Yes  Some recent data might be hidden     Patient questions:  Do you have a fever, pain , or abdominal swelling? No. Pain Score  0 *  Have you tolerated food without any problems? Yes.    Have you been able to return to your normal activities? Yes.    Do you have any questions about your discharge instructions: Diet   No. Medications  No. Follow up visit  No.  Do you have questions or concerns about your Care? No.  Actions: * If pain score is 4 or above: No action needed, pain <4.  1. Have you developed a fever since your procedure? No  2.   Have you had an respiratory symptoms (SOB or cough) since your procedure? No 3.   Have you tested positive for COVID 19 since your procedure No  4.   Have you had any family members/close contacts diagnosed with the COVID 19 since your procedure? No   If yes to any of these questions please route to Joylene John, RN and Joella Prince, RN

## 2020-07-13 ENCOUNTER — Encounter: Payer: Self-pay | Admitting: Internal Medicine

## 2020-09-25 ENCOUNTER — Other Ambulatory Visit: Payer: Self-pay | Admitting: Adult Health

## 2020-09-27 ENCOUNTER — Other Ambulatory Visit: Payer: Self-pay

## 2020-09-27 ENCOUNTER — Ambulatory Visit (INDEPENDENT_AMBULATORY_CARE_PROVIDER_SITE_OTHER): Payer: Medicare HMO | Admitting: Adult Health

## 2020-09-27 ENCOUNTER — Other Ambulatory Visit: Payer: Self-pay | Admitting: Adult Health

## 2020-09-27 VITALS — BP 129/83 | HR 83 | Ht 64.5 in | Wt 177.0 lb

## 2020-09-27 DIAGNOSIS — G43101 Migraine with aura, not intractable, with status migrainosus: Secondary | ICD-10-CM

## 2020-09-27 DIAGNOSIS — R4189 Other symptoms and signs involving cognitive functions and awareness: Secondary | ICD-10-CM

## 2020-09-27 MED ORDER — AJOVY 225 MG/1.5ML ~~LOC~~ SOAJ: 225.0000 mg | SUBCUTANEOUS | 11 refills | Status: DC

## 2020-09-27 NOTE — Patient Instructions (Addendum)
Your Plan:  Continue Effexor  Start Ajovy- monthly injection   Continue Aricept 10 mg at bedtime If your symptoms worsen or you develop new symptoms please let us know.   Thank you for coming to see [place] at [facility]. I hope we have been able to provide you high quality care today.  You may receive a patient satisfaction survey over the next few weeks. We would appreciate your feedback and comments so that we may continue to improve ourselves and the health of our patients.

## 2020-09-27 NOTE — Progress Notes (Signed)
PATIENT: Lauren Fuller DOB: [birth year]  REASON FOR VISIT: follow up HISTORY FROM: patient Primary neurologist: Dr. Brett Fairy  HISTORY OF PRESENT ILLNESS: Today 09/27/20:  Lauren Fuller is a 65 year old female with a history of migraine headaches and cognitive dysfunction.  She returns today for follow-up.  She states that she has approximately 5 headaches a month.  She states that if she takes sumatriptan right away and then lays down her headaches typically resolve.  For severe headache she does use oxycodone.  She states that she may have to use this 2-3 times a month.  The patient has tried Amerge, Relpax, Zonegran, Mobic and Depakote.  She feels that her memory has remained stable.  On occasion she may have a bad day.  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 Overall she feels that things have remained stable. She states that her cognition has been stable. She states that she goes through phases where she may have delayed responses but overall no new symptoms. She continues on Aricept.  Migraines remain under good control. She reports that she has approximately 4-5 headaches a month. Sumatriptan tends to work well for her. If she has a severe headache she may take oxycodone. She has not refilled oxycodone since last year. She remains on Effexor. She returns today for an evaluation.  HISTORY  05/18/18 Lauren Fuller is a 65 y.o. female here today for follow up for migraines and cognitive dysfunction following chemotherapy. She is taking Aricept 10mg  daily. She feels that cognition is stable. She is still noticing some forgetfulness but overall she is doing well. She has about 5 migraines a month. She feels this is significantly better than her baseline of daily migraines about 6-7 years ago. Botox helped to drop these migraine to about 15-18 per month. She is using  sumatriptan as needed for migraine abortion. She does feel that most times this will knock the headache out. She rarely has to take an oxycodone for migraines. She got a dath piercing about 3 years ago. She feels that this has helped her.  She is helping to care for her adult son who is suffering from seizures.   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  She returns today for follow-up.  She reports that her headaches remain under good control.  She has approximately 5 headaches a month.  She typically can get relief with Amerge but if she has a severe headache that does not respond she will use oxycodone.  She states typically her headaches occur on the right side she does have photophobia but denies nausea and vomiting.  She feels that her cognition is slightly worse.  She states she has more of a concentration and focus issue.  In the past she has been on Adderall with no noticeable benefit.  She is able to complete all ADLs independently.  She denies any issues driving.  She manages her own finances without difficulty.  Denies any change in appetite.  She is able to prepare meals without difficulty.  She returns today for an evaluation.  REVIEW OF SYSTEMS: Out of a complete 14 system review of symptoms, the patient complains only of the following symptoms, and all other reviewed systems are negative.  See HPI  ALLERGIES: Allergies  Allergen Reactions  Aspirin-Acetaminophen-Caffeine Anaphylaxis    Excedrin - jittery excedrin REACTION: makes me really nervous inside   Acetaminophen-Caffeine     Other reaction(s): Other (See Comments) Shakiness   Butalbital-Aspirin-Caffeine     dizzy   Butalbital-Aspirin-Caffeine     dizzy   Codeine Itching    REACTION: itching   Cyclosporine Other (See Comments) and Itching   Fiorinal P-F W/Codeine   [Butalbital-Asa-Caff-Codeine] Other (See Comments)    FLORINAL 3.- severe sedation   Hydromorphone Hcl     REACTION: not effective   Pentazocine Lactate     REACTION: hallucinations   Pentazocine Lactate     Makes pt feel drunk   Pentazocine Anxiety    hallucinations    HOME MEDICATIONS: Outpatient Medications Prior to Visit  Medication Sig Dispense Refill   ascorbic acid (VITAMIN C) 1000 MG tablet 1,000 mg.     atorvastatin (LIPITOR) 40 MG tablet TAKE 1 TABLET DAILY 90 tablet 3   azelastine (ASTELIN) 0.1 % nasal spray      betamethasone valerate (VALISONE) 0.1 % cream Apply topically.     betamethasone valerate (VALISONE) 0.1 % cream betamethasone valerate 0.1 % topical cream  APPLY A THIN LAYER TO THE AFFECTED AREA(S) TWICE DAILY FOR A WEEK THEN 2 3 TIMES WEEKLY ONGOING     clobetasol ointment (TEMOVATE) 0.05 % Apply topically 2 (two) times daily.     donepezil (ARICEPT) 10 MG tablet Take 1 tablet (10 mg total) by mouth daily. 90 tablet 3   Flaxseed, Linseed, (FLAXSEED OIL) 1000 MG CAPS Take by mouth daily.     Folic Acid-Vit S3-MHD Q22 (B COMPLEX-FOLIC ACID) 297-9-892 MCG-MG-MCG TABS Take by mouth.     hydrochlorothiazide (HYDRODIURIL) 12.5 MG tablet Take 12.5 mg by mouth daily.     meloxicam (MOBIC) 7.5 MG tablet Take 7.5 mg by mouth daily.     methocarbamol (ROBAXIN) 500 MG tablet Take 500 mg by mouth every 6 (six) hours as needed for muscle spasms (Every 6-8 hours as needed).     mirtazapine (REMERON SOL-TAB) 30 MG disintegrating tablet DISSOLVE ONE TABLET IN MOUTH AT BEDTIME 90 tablet 1   Multiple Vitamin (MULTIVITAMIN) capsule Take 1 capsule by mouth daily.     Omega-3 Fatty Acids (FISH OIL) 1000 MG CAPS 1 capsule.     omeprazole (PRILOSEC) 40 MG capsule Take 40 mg by mouth daily.     oxyCODONE-acetaminophen (PERCOCET) 10-325 MG tablet Take 1 tablet daily as needed. Do not exceed 2 tablets within 24 hours. 30 tablet 0   SUMAtriptan (IMITREX) 50 MG tablet May repeat in 2 hours  if headache persists or recurs. 9 tablet 11   valsartan (DIOVAN) 160 MG tablet Take 160 mg by mouth daily.     venlafaxine XR (EFFEXOR XR) 75 MG 24 hr capsule Take 1 capsule (75 mg total) by mouth daily with breakfast. 90 capsule 3   VITAMIN D, CHOLECALCIFEROL, PO Take 1,000 Units by mouth daily.      Calcium Carbonate-Vitamin D 600-400 MG-UNIT tablet 1 tablet.     fenofibrate (TRICOR) 145 MG tablet 145 mg.     amoxicillin (AMOXIL) 875 MG tablet Take 875 mg by mouth 2 (two) times daily. (Patient not taking: Reported on 07/05/2020)     Prenatal w/o A Vit-Fe Fum-FA (BP MULTINATAL PLUS) 30-1 MG TABS Take by mouth. (Patient not taking: No sig reported)     Facility-Administered Medications Prior to Visit  Medication Dose Route Frequency Provider Last Rate Last Admin   0.9 %  sodium chloride infusion  500 mL Intravenous Once Irene Shipper, MD        PAST MEDICAL HISTORY: Past Medical History:  Diagnosis Date   ALLERGIC RHINITIS 10/27/2006   Allergy    seasonal allergies   Anemia    hx of   Anxiety    hx of   Arthritis    back   Breast cancer ([place]) 2007   RIGHT   Cataract    bilateral -sx   Depression    hx of   GERD 10/27/2006   on meds   HYPERLIPIDEMIA 10/27/2006   on meds   HYPERTENSION 10/27/2006   on meds   Memory loss    Migraines    Organic brain syndrome    PULMONARY EMBOLISM, HX OF 2001   RENAL INSUFFICIENCY 08/01/2009    PAST SURGICAL HISTORY: Past Surgical History:  Procedure Laterality Date   ABDOMINAL HYSTERECTOMY  2001   BREAST SURGERY Bilateral 2008   mastectomy   CATARACT EXTRACTION Right 01/2016   CATARACT EXTRACTION Left 2013   CESAREAN SECTION  1988   CHOLECYSTECTOMY     COLONOSCOPY  2011   JP-F/V-movi(exc)-1 polyps removed and not sent to path   KNEE SURGERY Left 0086   NISSEN FUNDOPLICATION     TUBAL LIGATION     VOCAL CORD INJECTION     WISDOM TOOTH EXTRACTION      FAMILY HISTORY: Family History  Problem Relation Age of Onset   Heart  disease Mother    Heart disease Father    Cancer Maternal Uncle        leukemia   Lung cancer Sister 41   Colon polyps Neg Hx    Colon cancer Neg Hx    Esophageal cancer Neg Hx    Stomach cancer Neg Hx    Rectal cancer Neg Hx     SOCIAL HISTORY: Social History   Socioeconomic History   Marital status: Married    Spouse name: Tommy   Number of children: 2   Years of education: BS   Highest education level: Not on file  Occupational History    Employer: [employer]  Tobacco Use   Smoking status: Never   Smokeless tobacco: Never  Vaping Use   Vaping Use: Never used  Substance and Sexual Activity   Alcohol use: No   Drug use: Never   Sexual activity: Not on file  Other Topics Concern   Not on file  Social History Narrative   Patient is married Psychologist, 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   Patient has two children.   Patient is disabled.   Patient drinks caffeine three to four times per week.   Patient is right-handed.   Patient has a Financial planner.   Social Determinants of Health   Financial Resource Strain: Not on file  Food Insecurity: Not on file  Transportation Needs: Not on file  Physical Activity: Not on file  Stress: Not on file  Social Connections: Not on file  Intimate Partner Violence: Not on file      PHYSICAL EXAM  Vitals:   09/27/20 0857  BP: 129/83  Pulse: 83  Weight: 177 lb (80.3 kg)  Height: 5' 4.5" (1.638 m)   Body mass index is 29.91 kg/m.    Montreal Cognitive Assessment  04/28/2017 04/24/2016 10/15/2015 04/05/2015  Visuospatial/ Executive (0/5) 5 5 5 5   Naming (0/3) 3 3 3 3   Attention: Read list of digits (0/2) 1 2  1 2  Attention: Read list of letters (0/1) 1 1 1 1   Attention: Serial 7 subtraction starting at 100 (0/3) 3 2 3 3   Language: Repeat phrase (0/2) 2 2 2 2   Language : Fluency (0/1) 1 1 1 1   Abstraction (0/2) 2 2 2 2   Delayed Recall (0/5) 3 4 3 4   Orientation (0/6) 6 6 6 5   Total 27 28 27 28    Adjusted Score (based on education) - - - 28    Generalized: Well developed, in no acute distress   Neurological examination  Mentation: Alert oriented to time, place, history taking. Follows all commands speech and language fluent Cranial nerve II-XII: Pupils were equal round reactive to light. Extraocular movements were full, visual field were full on confrontational test. . Head turning and shoulder shrug  were normal and symmetric. Motor: The motor testing reveals 5 over 5 strength of all 4 extremities. Good symmetric motor tone is noted throughout.  Sensory: Sensory testing is intact to soft touch on all 4 extremities. No evidence of extinction is noted.  Coordination: Cerebellar testing reveals good finger-nose-finger and heel-to-shin bilaterally.  Gait and station: Gait is normal.    DIAGNOSTIC DATA (LABS, IMAGING, TESTING) - I reviewed patient records, labs, notes, testing and imaging myself where available.  Lab Results  Component Value Date   WBC 10.4 04/13/2020   HGB 12.5 04/13/2020   HCT 37.5 04/13/2020   MCV 91.9 04/13/2020   PLT 355 04/13/2020      Component Value Date/Time   NA 136 04/13/2020 1730   NA 141 10/23/2016 1444   NA 141 04/14/2013 0951   K 3.5 04/13/2020 1730   K 4.0 04/14/2013 0951   CL 100 04/13/2020 1730   CO2 22 04/13/2020 1730   CO2 28 04/14/2013 0951   GLUCOSE 104 (H) 04/13/2020 1730   GLUCOSE 101 04/14/2013 0951   BUN 29 (H) 04/13/2020 1730   BUN 21 10/23/2016 1444   BUN 23.6 04/14/2013 0951   CREATININE 0.78 04/13/2020 1730   CREATININE 0.8 04/14/2013 0951   CALCIUM 9.0 04/13/2020 1730   CALCIUM 9.5 04/14/2013 0951   PROT 6.5 04/13/2020 1730   PROT 7.0 10/23/2016 1444   PROT 6.7 04/14/2013 0951   ALBUMIN 3.7 04/13/2020 1730   ALBUMIN 4.5 10/23/2016 1444   ALBUMIN 4.0 04/14/2013 0951   AST 49 (H) 04/13/2020 1730   AST 18 04/14/2013 0951   ALT 53 (H) 04/13/2020 1730   ALT 24 04/14/2013 0951   ALKPHOS 62 04/13/2020 1730   ALKPHOS  60 04/14/2013 0951   BILITOT 0.4 04/13/2020 1730   BILITOT <0.2 10/23/2016 1444   BILITOT 0.34 04/14/2013 0951   GFRNONAA >60 04/13/2020 1730   GFRAA 86 10/23/2016 1444   Lab Results  Component Value Date   CHOL 168 02/20/2011   HDL 57.30 02/20/2011   LDLCALC 79 02/20/2011   LDLDIRECT 105.9 07/09/2006   TRIG 161.0 (H) 02/20/2011   CHOLHDL 3 02/20/2011   No results found for: HGBA1C Lab Results  Component Value Date   VITAMINB12 415 05/18/2008   Lab Results  Component Value Date   TSH 1.339 10/24/2010      ASSESSMENT AND PLAN 65 y.o. year old female  has a past medical history of ALLERGIC RHINITIS (10/27/2006), Allergy, Anemia, Anxiety, Arthritis, Breast cancer ([place]) (2007), Cataract, Depression, GERD (10/27/2006), HYPERLIPIDEMIA (10/27/2006), HYPERTENSION (10/27/2006), Memory loss, Migraines, Organic brain syndrome, PULMONARY EMBOLISM, HX OF (2001), and RENAL INSUFFICIENCY (08/01/2009). here with :  Migraine  headaches  Continue Effexor XR 75 mg daily   Continue sumatriptan for abortive therapy Start Ajovy 225 mg monthly injection Has prescription for oxycodone for severe headache- Last refilled 05/31/20   Cognitive dysfunction  Stable- refused memory testing Continue Aricept 10 mg at bedtime  FU in 6 months or sooner if needed    Ward Givens, MSN, NP-C 09/27/2020, 9:05 AM [facility] [address], [place], [place] [address] [phone]

## 2020-09-27 NOTE — Telephone Encounter (Signed)
Initiated on [facility] KEY#[id number] for Ajovy.  It stated [facility] is not able to process this request through [place], please contact the plan at [phone] or fax in request to [phone].  I called spoke to April in [place] CASE ID # [id number] 72 hour determination.

## 2020-10-03 ENCOUNTER — Telehealth: Payer: Self-pay | Admitting: *Deleted

## 2020-10-03 NOTE — Telephone Encounter (Signed)
Attempted to do PA for AJOVY preferred are aimovig , topamax, propranolol, and divalproex.  Do you want to change to aimovig?

## 2020-10-04 MED ORDER — AIMOVIG 140 MG/ML ~~LOC~~ SOAJ: 1.0000 mL | SUBCUTANEOUS | 11 refills | Status: DC

## 2020-10-04 NOTE — Telephone Encounter (Deleted)
Okay to change to antibiotic

## 2020-10-04 NOTE — Telephone Encounter (Signed)
Spoke to pt and she is ok to change to aimovig.  Will place order.  I relayed that will most likely still need PA on this and she verbalized understanding.

## 2020-10-04 NOTE — Telephone Encounter (Signed)
Okay to change to Aimovig

## 2020-10-08 ENCOUNTER — Telehealth: Payer: Self-pay

## 2020-10-08 NOTE — Telephone Encounter (Signed)
Pa for Aimovig has been sent via cover my meds.    (Key: [id number])  Your information has been submitted to [place] Medicare Part D. [employer] D will review the request and will issue a decision, typically within 1-3 days from your submission. You can check the updated outcome later by reopening this request.  If [employer] D has not responded in 1-3 days or if you have any questions about your ePA request, please contact [place] Medicare Part D at [phone]. If you think there may be a problem with your PA request, use our live chat feature at the bottom right.

## 2020-10-08 NOTE — Telephone Encounter (Signed)
This approval authorizes your coverage from 03/17/2020 - 01/06/2021, unless we notify you  otherwise, and as long as the following conditions apply: ? you remain enrolled in our Medicare Part D prescription drug plan, ? your physician or other prescriber continues to prescribe the medication for you, and ? the medication continues to be safe for treating your condition.  Notified pharmacy via fax. Received a receipt of confirmation.

## 2020-10-15 ENCOUNTER — Other Ambulatory Visit: Payer: Self-pay | Admitting: Adult Health

## 2020-10-15 DIAGNOSIS — G43101 Migraine with aura, not intractable, with status migrainosus: Secondary | ICD-10-CM

## 2020-10-15 DIAGNOSIS — R4189 Other symptoms and signs involving cognitive functions and awareness: Secondary | ICD-10-CM

## 2021-01-10 ENCOUNTER — Telehealth: Payer: Self-pay | Admitting: *Deleted

## 2021-01-10 NOTE — Telephone Encounter (Addendum)
Aimovig Approval Dates01/03/2020-03/16/2022

## 2021-01-10 NOTE — Telephone Encounter (Addendum)
Lauren Fuller (KeyGrace Blight) Rx #: [id number] Aimovig 140MG /ML auto-injectors Your request has been approved  PA for Aimovig was approved will fax approval letter to [place].

## 2021-01-10 NOTE — Telephone Encounter (Signed)
Lauren Fuller (Lauren Fuller) Rx #: [id number] Aimovig 140MG /ML auto-injectors  Sent PA for Aimovig waiting on Approval

## 2021-04-01 ENCOUNTER — Ambulatory Visit: Payer: Medicare HMO | Admitting: Adult Health

## 2021-04-03 ENCOUNTER — Encounter: Payer: Self-pay | Admitting: Adult Health

## 2021-04-03 ENCOUNTER — Ambulatory Visit: Payer: Medicare HMO | Admitting: Adult Health

## 2021-04-03 ENCOUNTER — Telehealth: Payer: Self-pay | Admitting: Adult Health

## 2021-05-02 NOTE — Telephone Encounter (Signed)
Patient arrived 6 min late and was rescheduled after checking with provider.  She stated she was "not happy" and argued about a grace period 15 min before or 15 min after. She was offered to speak with a Freight forwarder but refused.Marland Kitchen

## 2021-08-06 ENCOUNTER — Ambulatory Visit (INDEPENDENT_AMBULATORY_CARE_PROVIDER_SITE_OTHER): Payer: Medicare HMO | Admitting: Adult Health

## 2021-08-06 ENCOUNTER — Encounter: Payer: Self-pay | Admitting: Adult Health

## 2021-08-06 DIAGNOSIS — R4189 Other symptoms and signs involving cognitive functions and awareness: Secondary | ICD-10-CM | POA: Diagnosis not present

## 2021-08-06 DIAGNOSIS — G43101 Migraine with aura, not intractable, with status migrainosus: Secondary | ICD-10-CM

## 2021-08-06 MED ORDER — AIMOVIG 140 MG/ML ~~LOC~~ SOAJ: 1.0000 mL | SUBCUTANEOUS | 11 refills | Status: DC

## 2021-08-06 MED ORDER — SUMATRIPTAN SUCCINATE 50 MG PO TABS: ORAL_TABLET | ORAL | 11 refills | Status: DC

## 2021-08-06 MED ORDER — DONEPEZIL HCL 10 MG PO TABS: 10.0000 mg | ORAL_TABLET | Freq: Every day | ORAL | 3 refills | Status: DC

## 2021-08-06 NOTE — Progress Notes (Signed)
PATIENT: Lauren Fuller DOB: April 21, 1955  REASON FOR VISIT: follow up HISTORY FROM: patient Primary neurologist: Dr. Brett Fairy  Chief Complaint  Patient presents with   Follow-up    Pt in 20  pt is here for Migraine follow up pt states she is doing great on Aimovig and she has no questions or concerns for today's visit     HISTORY OF PRESENT ILLNESS: Today 08/06/21:  Lauren Fuller is a 66 year old female with a history of migraine headaches.  She returns today for follow-up.  At the last visit she was started on Aimovig.  She reports that her headaches are doing well.  She reports that she is having 2-3 a month with weather changes. Continues to use Imitrex for abortive therapy.On occasion has to take a second dose.  Remains on Effexor 75 mg daily prescribed by psychiatrist.   On Aricept 10 mg daily feels that memory and cognition is the same. On occasion still forgets things but its not been as often.   09/27/20: Lauren Fuller is a 66 year old female with a history of migraine headaches and cognitive dysfunction.  She returns today for follow-up.  She states that she has approximately 5 headaches a month.  She states that if she takes sumatriptan right away and then lays down her headaches typically resolve.  For severe headache she does use oxycodone.  She states that she may have to use this 2-3 times a month.  The patient has tried Amerge, Relpax, Zonegran, Mobic and Depakote.  She feels that her memory has remained stable.  On occasion she may have a bad day.  She remains on Aricept 10 mg daily  09/22/19: Lauren Fuller is a 66 year old female with a history of migraine headaches and cognitive dysfunction following chemotherapy. Overall she feels that things have remained stable. She states that her cognition has been stable. She states that she goes through phases where she may have delayed responses but overall no new symptoms. She continues on Aricept.  Migraines remain under good control. She  reports that she has approximately 4-5 headaches a month. Sumatriptan tends to work well for her. If she has a severe headache she may take oxycodone. She has not refilled oxycodone since last year. She remains on Effexor. She returns today for an evaluation.  HISTORY  05/18/18 Lauren Fuller is a 66 y.o. female here today for follow up for migraines and cognitive dysfunction following chemotherapy. She is taking Aricept '10mg'$  daily. She feels that cognition is stable. She is still noticing some forgetfulness but overall she is doing well. She has about 5 migraines a month. She feels this is significantly better than her baseline of daily migraines about 6-7 years ago. Botox helped to drop these migraine to about 15-18 per month. She is using sumatriptan as needed for migraine abortion. She does feel that most times this will knock the headache out. She rarely has to take an oxycodone for migraines. She got a dath piercing about 3 years ago. She feels that this has helped her.  She is helping to care for her adult son who is suffering from seizures.   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  She returns today for follow-up.  She reports that her headaches remain under good control.  She has approximately 5 headaches a month.  She typically can get relief with Amerge but if she  has a severe headache that does not respond she will use oxycodone.  She states typically her headaches occur on the right side she does have photophobia but denies nausea and vomiting.  She feels that her cognition is slightly worse.  She states she has more of a concentration and focus issue.  In the past she has been on Adderall with no noticeable benefit.  She is able to complete all ADLs independently.  She denies any issues driving.  She manages her own finances without difficulty.  Denies any  change in appetite.  She is able to prepare meals without difficulty.  She returns today for an evaluation.  REVIEW OF SYSTEMS: Out of a complete 14 system review of symptoms, the patient complains only of the following symptoms, and all other reviewed systems are negative.  See HPI  ALLERGIES: Allergies  Allergen Reactions   Aspirin-Acetaminophen-Caffeine Anaphylaxis    Excedrin - jittery excedrin REACTION: makes me really nervous inside   Acetaminophen-Caffeine     Other reaction(s): Other (See Comments) Shakiness   Butalbital-Aspirin-Caffeine     dizzy   Butalbital-Aspirin-Caffeine     dizzy   Codeine Itching    REACTION: itching   Cyclosporine Other (See Comments) and Itching   Fiorinal P-F W/Codeine  [Butalbital-Asa-Caff-Codeine] Other (See Comments)    FLORINAL 3.- severe sedation   Hydromorphone Hcl     REACTION: not effective   Pentazocine Lactate     REACTION: hallucinations   Pentazocine Lactate     Makes pt feel drunk   Pentazocine Anxiety    hallucinations    HOME MEDICATIONS: Outpatient Medications Prior to Visit  Medication Sig Dispense Refill   ascorbic acid (VITAMIN C) 1000 MG tablet 1,000 mg.     atorvastatin (LIPITOR) 40 MG tablet TAKE 1 TABLET DAILY 90 tablet 3   azelastine (ASTELIN) 0.1 % nasal spray      betamethasone valerate (VALISONE) 0.1 % cream Apply topically.     betamethasone valerate (VALISONE) 0.1 % cream betamethasone valerate 0.1 % topical cream  APPLY A THIN LAYER TO THE AFFECTED AREA(S) TWICE DAILY FOR A WEEK THEN 2 3 TIMES WEEKLY ONGOING     clobetasol ointment (TEMOVATE) 0.05 % Apply topically 2 (two) times daily.     donepezil (ARICEPT) 10 MG tablet TAKE 1 TABLET BY MOUTH EVERY DAY 90 tablet 3   Erenumab-aooe (AIMOVIG) 140 MG/ML SOAJ Inject 140 mg into the skin every 30 (thirty) days. 1.12 mL 11   Flaxseed, Linseed, (FLAXSEED OIL) 1000 MG CAPS Take by mouth daily.     Folic Acid-Vit A3-FTD D22 (B COMPLEX-FOLIC ACID) 025-4-270  MCG-MG-MCG TABS Take by mouth.     hydrochlorothiazide (HYDRODIURIL) 12.5 MG tablet Take 12.5 mg by mouth daily.     meloxicam (MOBIC) 7.5 MG tablet Take 7.5 mg by mouth daily.     methocarbamol (ROBAXIN) 500 MG tablet Take 500 mg by mouth every 6 (six) hours as needed for muscle spasms (Every 6-8 hours as needed).     mirtazapine (REMERON SOL-TAB) 30 MG disintegrating tablet DISSOLVE ONE TABLET IN MOUTH AT BEDTIME 90 tablet 1   Multiple Vitamin (MULTIVITAMIN) capsule Take 1 capsule by mouth daily.     Omega-3 Fatty Acids (FISH OIL) 1000 MG CAPS 1 capsule.     omeprazole (PRILOSEC) 40 MG capsule Take 40 mg by mouth 2 (two) times daily.     oxyCODONE-acetaminophen (PERCOCET) 10-325 MG tablet Take 1 tablet daily as needed. Do not exceed 2 tablets within 24 hours.  30 tablet 0   SUMAtriptan (IMITREX) 50 MG tablet TAKE 1 TABLET BY MOUTH AS NEEDED. MAY REPEAT IN 2 HOURS IF HEADACHE PERSISTS OR RECURS. 9 tablet 11   valsartan (DIOVAN) 160 MG tablet Take 160 mg by mouth daily.     venlafaxine XR (EFFEXOR XR) 75 MG 24 hr capsule Take 1 capsule (75 mg total) by mouth daily with breakfast. 90 capsule 3   VITAMIN D, CHOLECALCIFEROL, PO Take 1,000 Units by mouth daily.      Calcium Carbonate-Vitamin D 600-400 MG-UNIT tablet 1 tablet.     fenofibrate (TRICOR) 145 MG tablet 145 mg.     Facility-Administered Medications Prior to Visit  Medication Dose Route Frequency Provider Last Rate Last Admin   0.9 %  sodium chloride infusion  500 mL Intravenous Once Irene Shipper, MD        PAST MEDICAL HISTORY: Past Medical History:  Diagnosis Date   ALLERGIC RHINITIS 10/27/2006   Allergy    seasonal allergies   Anemia    hx of   Anxiety    hx of   Arthritis    back   Breast cancer ([place]) 2007   RIGHT   Cataract    bilateral -sx   Depression    hx of   GERD 10/27/2006   on meds   HYPERLIPIDEMIA 10/27/2006   on meds   HYPERTENSION 10/27/2006   on meds   Memory loss    Migraines    Organic brain  syndrome    PULMONARY EMBOLISM, HX OF 2001   RENAL INSUFFICIENCY 08/01/2009    PAST SURGICAL HISTORY: Past Surgical History:  Procedure Laterality Date   ABDOMINAL HYSTERECTOMY  2001   BREAST SURGERY Bilateral 2008   mastectomy   CATARACT EXTRACTION Right 01/2016   CATARACT EXTRACTION Left 2013   [place]   CHOLECYSTECTOMY     COLONOSCOPY  2011   JP-F/V-movi(exc)-1 polyps removed and not sent to path   KNEE SURGERY Left 9983   NISSEN FUNDOPLICATION     TUBAL LIGATION     VOCAL CORD INJECTION     WISDOM TOOTH EXTRACTION      FAMILY HISTORY: Family History  Problem Relation Age of Onset   Heart disease Mother    Heart disease Father    Lung cancer Sister 22   Cancer Maternal Uncle        leukemia   Migraines Son    Migraines Daughter    Colon polyps Neg Hx    Colon cancer Neg Hx    Esophageal cancer Neg Hx    Stomach cancer Neg Hx    Rectal cancer Neg Hx     SOCIAL HISTORY: Social History   Socioeconomic History   Marital status: Married    Spouse name: Tommy   Number of children: 2   Years of education: BS   Highest education level: Not on file  Occupational History    Employer: [employer]  Tobacco Use   Smoking status: Never   Smokeless tobacco: Never  Vaping Use   Vaping Use: Never used  Substance and Sexual Activity   Alcohol use: No   Drug use: Never   Sexual activity: Not on file  Other Topics Concern   Not on file  Social History Narrative   Patient is married Psychologist, 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   Patient has two children.   Patient is disabled.   Patient drinks caffeine three to four times  per week.   Patient is right-handed.   Patient has a Financial planner.   Social Determinants of Health   Financial Resource Strain: Not on file  Food Insecurity: Not on file  Transportation Needs: Not on file  Physical Activity: Not on file  Stress: Not on file  Social Connections: Not on file  Intimate  Partner Violence: Not on file      PHYSICAL EXAM  Vitals:   08/06/21 1006  BP: 108/65  Pulse: 80  Weight: 160 lb (72.6 kg)  Height: '5\' 4"'$  (1.626 m)   Body mass index is 27.46 kg/m.       04/28/2017   11:23 AM 04/24/2016    1:02 PM 10/15/2015    1:05 PM 04/05/2015   10:59 AM  Montreal Cognitive Assessment   Visuospatial/ Executive (0/5) '5 5 5 5  '$ Naming (0/3) '3 3 3 3  '$ Attention: Read list of digits (0/2) '1 2 1 2  '$ Attention: Read list of letters (0/1) '1 1 1 1  '$ Attention: Serial 7 subtraction starting at 100 (0/3) '3 2 3 3  '$ Language: Repeat phrase (0/2) '2 2 2 2  '$ Language : Fluency (0/1) '1 1 1 1  '$ Abstraction (0/2) '2 2 2 2  '$ Delayed Recall (0/5) '3 4 3 4  '$ Orientation (0/6) '6 6 6 5  '$ Total '27 28 27 28  '$ Adjusted Score (based on education)    28    Generalized: Well developed, in no acute distress   Neurological examination  Mentation: Alert oriented to time, place, history taking. Follows all commands speech and language fluent Cranial nerve II-XII: Pupils were equal round reactive to light. Extraocular movements were full, visual field were full on confrontational test. . Head turning and shoulder shrug  were normal and symmetric. Motor: The motor testing reveals 5 over 5 strength of all 4 extremities. Good symmetric motor tone is noted throughout.  Sensory: Sensory testing is intact to soft touch on all 4 extremities. No evidence of extinction is noted.  Coordination: Cerebellar testing reveals good finger-nose-finger and heel-to-shin bilaterally.  Gait and station: Gait is normal.    DIAGNOSTIC DATA (LABS, IMAGING, TESTING) - I reviewed patient records, labs, notes, testing and imaging myself where available.  Lab Results  Component Value Date   WBC 10.4 04/13/2020   HGB 12.5 04/13/2020   HCT 37.5 04/13/2020   MCV 91.9 04/13/2020   PLT 355 04/13/2020      Component Value Date/Time   NA 136 04/13/2020 1730   NA 141 10/23/2016 1444   NA 141 04/14/2013 0951   K 3.5  04/13/2020 1730   K 4.0 04/14/2013 0951   CL 100 04/13/2020 1730   CO2 22 04/13/2020 1730   CO2 28 04/14/2013 0951   GLUCOSE 104 (H) 04/13/2020 1730   GLUCOSE 101 04/14/2013 0951   BUN 29 (H) 04/13/2020 1730   BUN 21 10/23/2016 1444   BUN 23.6 04/14/2013 0951   CREATININE 0.78 04/13/2020 1730   CREATININE 0.8 04/14/2013 0951   CALCIUM 9.0 04/13/2020 1730   CALCIUM 9.5 04/14/2013 0951   PROT 6.5 04/13/2020 1730   PROT 7.0 10/23/2016 1444   PROT 6.7 04/14/2013 0951   ALBUMIN 3.7 04/13/2020 1730   ALBUMIN 4.5 10/23/2016 1444   ALBUMIN 4.0 04/14/2013 0951   AST 49 (H) 04/13/2020 1730   AST 18 04/14/2013 0951   ALT 53 (H) 04/13/2020 1730   ALT 24 04/14/2013 0951   ALKPHOS 62 04/13/2020 1730   ALKPHOS  60 04/14/2013 0951   BILITOT 0.4 04/13/2020 1730   BILITOT <0.2 10/23/2016 1444   BILITOT 0.34 04/14/2013 0951   GFRNONAA >60 04/13/2020 1730   GFRAA 86 10/23/2016 1444   Lab Results  Component Value Date   CHOL 168 02/20/2011   HDL 57.30 02/20/2011   LDLCALC 79 02/20/2011   LDLDIRECT 105.9 07/09/2006   TRIG 161.0 (H) 02/20/2011   CHOLHDL 3 02/20/2011    Lab Results  Component Value Date   VITAMINB12 415 05/18/2008   Lab Results  Component Value Date   TSH 1.339 10/24/2010      ASSESSMENT AND PLAN 66 y.o. year old female  has a past medical history of ALLERGIC RHINITIS (10/27/2006), Allergy, Anemia, Anxiety, Arthritis, Breast cancer ([place]) (2007), Cataract, Depression, GERD (10/27/2006), HYPERLIPIDEMIA (10/27/2006), HYPERTENSION (10/27/2006), Memory loss, Migraines, Organic brain syndrome, PULMONARY EMBOLISM, HX OF (2001), and RENAL INSUFFICIENCY (08/01/2009). here with :  Migraine headaches   Continue sumatriptan for abortive therapy Continue Aimovig 140 mg monthly injection  Cognitive dysfunction  Continue Aricept 10 mg at bedtime  FU in 1 year or sooner if needed or sooner if needed    Ward Givens, MSN, NP-C 08/06/2021, 10:39 AM [facility] [address] [place], [place] [address] [phone]

## 2021-08-06 NOTE — Patient Instructions (Signed)
Your Plan:  Continue Ajovy, effexor  Continue sumatriptan for abortive therapy If your symptoms worsen or you develop new symptoms please let us know.   Thank you for coming to see [place] at [facility]. I hope we have been able to provide you high quality care today.  You may receive a patient satisfaction survey over the next few weeks. We would appreciate your feedback and comments so that we may continue to improve ourselves and the health of our patients.

## 2021-12-17 ENCOUNTER — Telehealth: Payer: Self-pay | Admitting: Adult Health

## 2021-12-17 DIAGNOSIS — G43101 Migraine with aura, not intractable, with status migrainosus: Secondary | ICD-10-CM

## 2021-12-17 NOTE — Telephone Encounter (Signed)
Pt is calling. Stated she would like to talk to [facility] about increasing medication Erenumab-aooe (AIMOVIG) 140 MG/ML SOAJ.Pt is requesting a call back.

## 2021-12-17 NOTE — Telephone Encounter (Signed)
I called pt and she is having more migraines at the end of her aimovig '140mg'$  injection ( 3 -4 days prior to her next injection).  Even the sumatriptan is not working for her. She is having to take her hydrocodone (last resort), she is needing refill on that too.  Your last note stated continue ajovy but she is on aimovig.

## 2021-12-17 NOTE — Telephone Encounter (Signed)
Please let patient know she is on highest dose unless she wants to switch to another medication

## 2021-12-18 NOTE — Telephone Encounter (Signed)
Spoke with the patient.  She is willing to switch to another injectable like Ajovy or Emgality.

## 2021-12-18 NOTE — Telephone Encounter (Signed)
Does she want to try something else?

## 2021-12-19 MED ORDER — EMGALITY 120 MG/ML ~~LOC~~ SOAJ: 120.0000 mg | SUBCUTANEOUS | 5 refills | Status: DC

## 2021-12-19 NOTE — Telephone Encounter (Signed)
Spoke with the patient.  She is in agreement to switch to [employer]. She is aware that we will send the patient to pharmacy, complete a prior authorization if needed, and then she can fill this at her pharmacy and start it on the next day her Aimovig would have been due.  Patient aware that detailed instructions with each pen.  She is aware this must be refrigerated ahead of time.  Her questions were answered during the call.  She verbalized appreciation.  Emgality 120 mg SQ Q30 days has been ordered per v.o. Megan NP and Aimovig 140 mg d/c'd.

## 2021-12-19 NOTE — Telephone Encounter (Signed)
Lets try emgality

## 2022-01-08 ENCOUNTER — Telehealth: Payer: Self-pay | Admitting: *Deleted

## 2022-01-08 NOTE — Telephone Encounter (Signed)
Completed Emgality PA on Cover My Meds. Key: [id number]. Awaiting determination from [employer].

## 2022-01-08 NOTE — Telephone Encounter (Signed)
PA completed. This is documented in its own phone note.

## 2022-01-08 NOTE — Telephone Encounter (Signed)
Pt has been informed that a PA is needed for the Emgality.  Please begin

## 2022-01-08 NOTE — Telephone Encounter (Signed)
[facility] [employer] approved Emgality through 03/17/2023. I have faxed the approval letter to pt's pharmacy. Also called pt & LVM (ok per DPR) advising her of this.

## 2022-04-23 NOTE — Telephone Encounter (Signed)
error 

## 2022-08-12 ENCOUNTER — Telehealth (INDEPENDENT_AMBULATORY_CARE_PROVIDER_SITE_OTHER): Payer: Medicare HMO | Admitting: Adult Health

## 2022-08-12 DIAGNOSIS — R4189 Other symptoms and signs involving cognitive functions and awareness: Secondary | ICD-10-CM

## 2022-08-12 DIAGNOSIS — G43101 Migraine with aura, not intractable, with status migrainosus: Secondary | ICD-10-CM | POA: Diagnosis not present

## 2022-08-12 MED ORDER — DONEPEZIL HCL 10 MG PO TABS: 10.0000 mg | ORAL_TABLET | Freq: Every day | ORAL | 3 refills | Status: DC

## 2022-08-12 NOTE — Progress Notes (Signed)
PATIENT: Lauren Fuller DOB: 06/12/1955  REASON FOR VISIT: follow up HISTORY FROM: patient  Virtual Visit via Video Note  I connected with Lauren Fuller on 08/12/22 at  1:00 PM EDT by a video enabled telemedicine application located remotely at [facility] and verified that I am speaking with the correct person using two identifiers who was located at their own home.   I discussed the limitations of evaluation and management by telemedicine and the availability of in person appointments. The patient expressed understanding and agreed to proceed.   PATIENT: Lauren Fuller DOB: Aug 27, 1955  REASON FOR VISIT: follow up HISTORY FROM: patient  HISTORY OF PRESENT ILLNESS:  Today 08/12/22: Lauren Fuller is a 67 y.o. female with a history of Migraine headaches and Cognitive dysfunction. Returns today for follow-up.   Will get an aura- sensation down the right side of the face. She will take a spponful of mustard and the headache resolved within a couple of minutes. Only had a few headaches that the mustard did not work. Then she will use the imitrex. No longer taking Aimovig.  Feels that the combination of mustard and Imitrex works well for her.  Reports that memory is stable. Still has days that she feels off. Usually only husband and daughter pick up on that. Able to complete all ADLs independently. Remains on Aricept 10 mg.      HISTORY 08/06/21:   Lauren Fuller is a 67 year old female with a history of migraine headaches.  She returns today for follow-up.  At the last visit she was started on Aimovig.  She reports that her headaches are doing well.  She reports that she is having 2-3 a month with weather changes. Continues to use Imitrex for abortive therapy.On occasion has to take a second dose.  Remains on Effexor 75 mg daily prescribed by psychiatrist.    On Aricept 10 mg daily feels that memory and cognition is the same. On occasion still forgets things but its  not been as often.    09/27/20: Lauren Fuller is a 67 year old female with a history of migraine headaches and cognitive dysfunction.  She returns today for follow-up.  She states that she has approximately 5 headaches a month.  She states that if she takes sumatriptan right away and then lays down her headaches typically resolve.  For severe headache she does use oxycodone.  She states that she may have to use this 2-3 times a month.  The patient has tried Amerge, Relpax, Zonegran, Mobic and Depakote.  She feels that her memory has remained stable.  On occasion she may have a bad day.  She remains on Aricept 10 mg daily   09/22/19: Lauren Fuller is a 67 year old female with a history of migraine headaches and cognitive dysfunction following chemotherapy. Overall she feels that things have remained stable. She states that her cognition has been stable. She states that she goes through phases where she may have delayed responses but overall no new symptoms. She continues on Aricept.   Migraines remain under good control. She reports that she has approximately 4-5 headaches a month. Sumatriptan tends to work well for her. If she has a severe headache she may take oxycodone. She has not refilled oxycodone since last year. She remains on Effexor. She returns today for an evaluation.   HISTORY  05/18/18 Lauren Fuller is a 67 y.o. female here today for follow up for migraines and cognitive dysfunction following chemotherapy. She  is taking Aricept 10mg  daily. She feels that cognition is stable. She is still noticing some forgetfulness but overall she is doing well. She has about 5 migraines a month. She feels this is significantly better than her baseline of daily migraines about 6-7 years ago. Botox helped to drop these migraine to about 15-18 per month. She is using sumatriptan as needed for migraine abortion. She does feel that most times this will knock the headache out. She rarely has to take an oxycodone for  migraines. She got a dath piercing about 3 years ago. She feels that this has helped her.  She is helping to care for her adult son who is suffering from seizures.   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  She returns today for follow-up.  She reports that her headaches remain under good control.  She has approximately 5 headaches a month.  She typically can get relief with Amerge but if she has a severe headache that does not respond she will use oxycodone.  She states typically her headaches occur on the right side she does have photophobia but denies nausea and vomiting.  She feels that her cognition is slightly worse.  She states she has more of a concentration and focus issue.  In the past she has been on Adderall with no noticeable benefit.  She is able to complete all ADLs independently.  She denies any issues driving.  She manages her own finances without difficulty.  Denies any change in appetite.  She is able to prepare meals without difficulty.  She returns today for an evaluation.    REVIEW OF SYSTEMS: Out of a complete 14 system review of symptoms, the patient complains only of the following symptoms, and all other reviewed systems are negative.  ALLERGIES: Allergies  Allergen Reactions   Aspirin-Acetaminophen-Caffeine Anaphylaxis    Excedrin - jittery excedrin REACTION: makes me really nervous inside   Acetaminophen-Caffeine     Other reaction(s): Other (See Comments) Shakiness   Butalbital-Aspirin-Caffeine     dizzy   Butalbital-Aspirin-Caffeine     dizzy   Codeine Itching    REACTION: itching   Cyclosporine Other (See Comments) and Itching   Fiorinal P-F W/Codeine  [Butalbital-Asa-Caff-Codeine] Other (See Comments)    FLORINAL 3.- severe sedation   Hydromorphone Hcl     REACTION: not effective   Pentazocine Lactate     REACTION:  hallucinations   Pentazocine Lactate     Makes pt feel drunk   Pentazocine Anxiety    hallucinations    HOME MEDICATIONS: Outpatient Medications Prior to Visit  Medication Sig Dispense Refill   ascorbic acid (VITAMIN C) 1000 MG tablet 1,000 mg.     atorvastatin (LIPITOR) 40 MG tablet TAKE 1 TABLET DAILY 90 tablet 3   azelastine (ASTELIN) 0.1 % nasal spray      betamethasone valerate (VALISONE) 0.1 % cream Apply topically.     betamethasone valerate (VALISONE) 0.1 % cream betamethasone valerate 0.1 % topical cream  APPLY A THIN LAYER TO THE AFFECTED AREA(S) TWICE DAILY FOR A WEEK THEN 2 3 TIMES WEEKLY ONGOING     Calcium Carbonate-Vitamin D 600-400 MG-UNIT tablet 1 tablet.     clobetasol ointment (TEMOVATE) 0.05 % Apply topically 2 (two) times daily.     donepezil (ARICEPT) 10 MG tablet Take 1 tablet (10 mg total) by mouth daily. 90 tablet 3   fenofibrate (  TRICOR) 145 MG tablet 145 mg.     Flaxseed, Linseed, (FLAXSEED OIL) 1000 MG CAPS Take by mouth daily.     Folic Acid-Vit B6-Vit B12 (B COMPLEX-FOLIC ACID) 500-5-200 MCG-MG-MCG TABS Take by mouth.     Galcanezumab-gnlm (EMGALITY) 120 MG/ML SOAJ Inject 120 mg into the skin every 30 (thirty) days. 1 mL 5   hydrochlorothiazide (HYDRODIURIL) 12.5 MG tablet Take 12.5 mg by mouth daily.     meloxicam (MOBIC) 7.5 MG tablet Take 7.5 mg by mouth daily.     methocarbamol (ROBAXIN) 500 MG tablet Take 500 mg by mouth every 6 (six) hours as needed for muscle spasms (Every 6-8 hours as needed).     mirtazapine (REMERON SOL-TAB) 30 MG disintegrating tablet DISSOLVE ONE TABLET IN MOUTH AT BEDTIME 90 tablet 1   Multiple Vitamin (MULTIVITAMIN) capsule Take 1 capsule by mouth daily.     Omega-3 Fatty Acids (FISH OIL) 1000 MG CAPS 1 capsule.     omeprazole (PRILOSEC) 40 MG capsule Take 40 mg by mouth 2 (two) times daily.     oxyCODONE-acetaminophen (PERCOCET) 10-325 MG tablet Take 1 tablet daily as needed. Do not exceed 2 tablets within 24 hours. 30 tablet  0   SUMAtriptan (IMITREX) 50 MG tablet TAKE 1 TABLET BY MOUTH AS NEEDED. MAY REPEAT IN 2 HOURS IF HEADACHE PERSISTS OR RECURS. 9 tablet 11   valsartan (DIOVAN) 160 MG tablet Take 160 mg by mouth daily.     venlafaxine XR (EFFEXOR XR) 75 MG 24 hr capsule Take 1 capsule (75 mg total) by mouth daily with breakfast. 90 capsule 3   VITAMIN D, CHOLECALCIFEROL, PO Take 1,000 Units by mouth daily.      Facility-Administered Medications Prior to Visit  Medication Dose Route Frequency Provider Last Rate Last Admin   0.9 %  sodium chloride infusion  500 mL Intravenous Once Hilarie Fredrickson, MD        PAST MEDICAL HISTORY: Past Medical History:  Diagnosis Date   ALLERGIC RHINITIS 10/27/2006   Allergy    seasonal allergies   Anemia    hx of   Anxiety    hx of   Arthritis    back   Breast cancer (HCC) 2007   RIGHT   Cataract    bilateral -sx   Depression    hx of   GERD 10/27/2006   on meds   HYPERLIPIDEMIA 10/27/2006   on meds   HYPERTENSION 10/27/2006   on meds   Memory loss    Migraines    Organic brain syndrome    PULMONARY EMBOLISM, HX OF 2001   RENAL INSUFFICIENCY 08/01/2009    PAST SURGICAL HISTORY: Past Surgical History:  Procedure Laterality Date   ABDOMINAL HYSTERECTOMY  2001   BREAST SURGERY Bilateral 2008   mastectomy   CATARACT EXTRACTION Right 01/2016   CATARACT EXTRACTION Left 2013   CESAREAN SECTION  1988   CHOLECYSTECTOMY     COLONOSCOPY  2011   JP-F/V-movi(exc)-1 polyps removed and not sent to path   KNEE SURGERY Left 1996   NISSEN FUNDOPLICATION     TUBAL LIGATION     VOCAL CORD INJECTION     WISDOM TOOTH EXTRACTION      FAMILY HISTORY: Family History  Problem Relation Age of Onset   Heart disease Mother    Heart disease Father    Lung cancer Sister 28   Cancer Maternal Uncle        leukemia   Migraines Son  Migraines Daughter    Colon polyps Neg Hx    Colon cancer Neg Hx    Esophageal cancer Neg Hx    Stomach cancer Neg Hx    Rectal cancer  Neg Hx     SOCIAL HISTORY: Social History   Socioeconomic History   Marital status: Married    Spouse name: Tommy   Number of children: 2   Years of education: BS   Highest education level: Not on file  Occupational History    Employer: [employer]  Tobacco Use   Smoking status: Never   Smokeless tobacco: Never  Vaping Use   Vaping Use: Never used  Substance and Sexual Activity   Alcohol use: No   Drug use: Never   Sexual activity: Not on file  Other Topics Concern   Not on file  Social History Narrative   Patient is married Administrator, sports) and lives at home with her family.   Patient has two children.   Patient is disabled.   Patient drinks caffeine three to four times per week.   Patient is right-handed.   Patient has a Haematologist.   Social Determinants of Health   Financial Resource Strain: Not on file  Food Insecurity: Not on file  Transportation Needs: Not on file  Physical Activity: Not on file  Stress: Not on file  Social Connections: Not on file  Intimate Partner Violence: Not on file      PHYSICAL EXAM Generalized: Well developed, in no acute distress   Neurological examination  Mentation: Alert oriented to time, place, history taking. Follows all commands speech and language fluent Cranial nerve II-XII:Extraocular movements were full. Facial symmetry noted. uvula tongue midline. Head turning and shoulder shrug  were normal and symmetric. Motor: Good strength throughout subjectively per patient Sensory: Sensory testing is intact to soft touch on all 4 extremities subjectively per patient Coordination: Cerebellar testing reveals good finger-nose-finger  Gait and station: Patient is able to stand from a seated position. gait is normal.  Reflexes: UTA  DIAGNOSTIC DATA (LABS, IMAGING, TESTING) - I reviewed patient records, labs, notes, testing and imaging myself where available.  Lab Results  Component Value Date   WBC 10.4  04/13/2020   HGB 12.5 04/13/2020   HCT 37.5 04/13/2020   MCV 91.9 04/13/2020   PLT 355 04/13/2020      Component Value Date/Time   NA 136 04/13/2020 1730   NA 141 10/23/2016 1444   NA 141 04/14/2013 0951   K 3.5 04/13/2020 1730   K 4.0 04/14/2013 0951   CL 100 04/13/2020 1730   CO2 22 04/13/2020 1730   CO2 28 04/14/2013 0951   GLUCOSE 104 (H) 04/13/2020 1730   GLUCOSE 101 04/14/2013 0951   BUN 29 (H) 04/13/2020 1730   BUN 21 10/23/2016 1444   BUN 23.6 04/14/2013 0951   CREATININE 0.78 04/13/2020 1730   CREATININE 0.8 04/14/2013 0951   CALCIUM 9.0 04/13/2020 1730   CALCIUM 9.5 04/14/2013 0951   PROT 6.5 04/13/2020 1730   PROT 7.0 10/23/2016 1444   PROT 6.7 04/14/2013 0951   ALBUMIN 3.7 04/13/2020 1730   ALBUMIN 4.5 10/23/2016 1444   ALBUMIN 4.0 04/14/2013 0951   AST 49 (H) 04/13/2020 1730   AST 18 04/14/2013 0951   ALT 53 (H) 04/13/2020 1730   ALT 24 04/14/2013 0951   ALKPHOS 62 04/13/2020 1730   ALKPHOS 60 04/14/2013 0951   BILITOT 0.4 04/13/2020 1730   BILITOT <0.2 10/23/2016 1444  BILITOT 0.34 04/14/2013 0951   GFRNONAA >60 04/13/2020 1730   GFRAA 86 10/23/2016 1444   Lab Results  Component Value Date   CHOL 168 02/20/2011   HDL 57.30 02/20/2011   LDLCALC 79 02/20/2011   LDLDIRECT 105.9 07/09/2006   TRIG 161.0 (H) 02/20/2011   CHOLHDL 3 02/20/2011   No results found for: "HGBA1C" Lab Results  Component Value Date   VITAMINB12 415 05/18/2008   Lab Results  Component Value Date   TSH 1.339 10/24/2010      ASSESSMENT AND PLAN 67 y.o. year old female  has a past medical history of ALLERGIC RHINITIS (10/27/2006), Allergy, Anemia, Anxiety, Arthritis, Breast cancer (HCC) (2007), Cataract, Depression, GERD (10/27/2006), HYPERLIPIDEMIA (10/27/2006), HYPERTENSION (10/27/2006), Memory loss, Migraines, Organic brain syndrome, PULMONARY EMBOLISM, HX OF (2001), and RENAL INSUFFICIENCY (08/01/2009). here with:    1.  Migraine headaches  Continue using Imitrex as  an abortive therapy  2.  Cognitive dysfunction  Did part of the MMSE today.  Patient had 25 points without doing language the three-step command, writing a sentence, copying a design.  She was unable to do these last 3 sections due to this being a virtual visit Continue Aricept 10 mg daily  Follow-up in 1 year or sooner if needed    Butch Penny, MSN, NP-C 08/12/2022, 1:02 PM [facility] [address] [place], [place] [address] [phone]

## 2023-03-07 ENCOUNTER — Other Ambulatory Visit: Payer: Self-pay | Admitting: Adult Health

## 2023-03-09 NOTE — Telephone Encounter (Signed)
Rx refilled.

## 2023-05-08 ENCOUNTER — Other Ambulatory Visit (HOSPITAL_COMMUNITY): Payer: Self-pay

## 2023-05-08 ENCOUNTER — Telehealth: Payer: Self-pay

## 2023-05-08 NOTE — Telephone Encounter (Signed)
Pharmacy Patient Advocate Encounter  Received notification from [facility] that Prior Authorization for Emgality 120MG /ML auto-injectors (migraine) has been APPROVED from 05/08/2023 to 03/16/2024   PA #/Case ID/Reference #: [id number]/A

## 2023-05-08 NOTE — Telephone Encounter (Signed)
Pharmacy Patient Advocate Encounter   Received notification from CoverMyMeds that prior authorization for Emgality 120MG /ML auto-injectors (migraine) is required/requested.   Insurance verification completed.   The patient is insured through [facility] .   Per test claim: PA required; PA submitted to above mentioned insurance via CoverMyMeds Key/confirmation #/EOC [facility] Status is pending

## 2023-08-10 NOTE — Progress Notes (Unsigned)
 PATIENT: Lauren Fuller DOB: 07/30/1955  REASON FOR VISIT: follow up HISTORY FROM: patient  Virtual Visit via Video Note  I connected with Lien A Thaw on 08/10/23 at  1:30 PM EDT by a video enabled telemedicine application located remotely at [facility] and verified that I am speaking with the correct person using two identifiers who was located at their own home.   I discussed the limitations of evaluation and management by telemedicine and the availability of in person appointments. The patient expressed understanding and agreed to proceed.   PATIENT: Lauren Fuller DOB: Jan 23, 1956  REASON FOR VISIT: follow up HISTORY FROM: patient  HISTORY OF PRESENT ILLNESS:  Today 08/10/23: Lauren Fuller is a 68 y.o. female with a history of Migraine headaches and Cognitive dysfunction. Returns today for follow-up.   Will get an aura- sensation down the right side of the face. She will take a spponful of mustard and the headache resolved within a couple of minutes. Only had a few headaches that the mustard did not work. Then she will use the imitrex . No longer taking Aimovig .  Feels that the combination of mustard and Imitrex  works well for her.  Reports that memory is stable. Still has days that she feels off. Usually only husband and daughter pick up on that. Able to complete all ADLs independently. Remains on Aricept  10 mg.      HISTORY 08/06/21:   Lauren Fuller is a 68 year old female with a history of migraine headaches.  She returns today for follow-up.  At the last visit she was started on Aimovig .  She reports that her headaches are doing well.  She reports that she is having 2-3 a month with weather changes. Continues to use Imitrex  for abortive therapy.On occasion has to take a second dose.  Remains on Effexor  75 mg daily prescribed by psychiatrist.    On Aricept  10 mg daily feels that memory and cognition is the same. On occasion still forgets things but its  not been as often.    09/27/20: Lauren Fuller is a 68 year old female with a history of migraine headaches and cognitive dysfunction.  She returns today for follow-up.  She states that she has approximately 5 headaches a month.  She states that if she takes sumatriptan  right away and then lays down her headaches typically resolve.  For severe headache she does use oxycodone .  She states that she may have to use this 2-3 times a month.  The patient has tried Amerge, Relpax, Zonegran, Mobic and Depakote.  She feels that her memory has remained stable.  On occasion she may have a bad day.  She remains on Aricept  10 mg daily   09/22/19: Lauren Fuller is a 68 year old female with a history of migraine headaches and cognitive dysfunction following chemotherapy. Overall she feels that things have remained stable. She states that her cognition has been stable. She states that she goes through phases where she may have delayed responses but overall no new symptoms. She continues on Aricept .   Migraines remain under good control. She reports that she has approximately 4-5 headaches a month. Sumatriptan  tends to work well for her. If she has a severe headache she may take oxycodone . She has not refilled oxycodone  since last year. She remains on Effexor . She returns today for an evaluation.   HISTORY  05/18/18 Lauren Fuller is a 68 y.o. female here today for follow up for migraines and cognitive dysfunction following chemotherapy. She  is taking Aricept  10mg  daily. She feels that cognition is stable. She is still noticing some forgetfulness but overall she is doing well. She has about 5 migraines a month. She feels this is significantly better than her baseline of daily migraines about 6-7 years ago. Botox helped to drop these migraine to about 15-18 per month. She is using sumatriptan  as needed for migraine abortion. She does feel that most times this will knock the headache out. She rarely has to take an oxycodone  for  migraines. She got a dath piercing about 3 years ago. She feels that this has helped her.  She is helping to care for her adult son who is suffering from seizures.   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  She returns today for follow-up.  She reports that her headaches remain under good control.  She has approximately 5 headaches a month.  She typically can get relief with Amerge but if she has a severe headache that does not respond she will use oxycodone .  She states typically her headaches occur on the right side she does have photophobia but denies nausea and vomiting.  She feels that her cognition is slightly worse.  She states she has more of a concentration and focus issue.  In the past she has been on Adderall with no noticeable benefit.  She is able to complete all ADLs independently.  She denies any issues driving.  She manages her own finances without difficulty.  Denies any change in appetite.  She is able to prepare meals without difficulty.  She returns today for an evaluation.    REVIEW OF SYSTEMS: Out of a complete 14 system review of symptoms, the patient complains only of the following symptoms, and all other reviewed systems are negative.  ALLERGIES: Allergies  Allergen Reactions   Aspirin-Acetaminophen -Caffeine Anaphylaxis    Excedrin - jittery excedrin REACTION: makes me really nervous inside   Acetaminophen -Caffeine     Other reaction(s): Other (See Comments) Shakiness   Butalbital-Aspirin-Caffeine     dizzy   Butalbital-Aspirin-Caffeine     dizzy   Codeine Itching    REACTION: itching   Cyclosporine Other (See Comments) and Itching   Fiorinal P-F W/Codeine  [Butalbital-Asa-Caff-Codeine] Other (See Comments)    FLORINAL 3.- severe sedation   Hydromorphone Hcl     REACTION: not effective   Pentazocine Lactate     REACTION:  hallucinations   Pentazocine Lactate     Makes pt feel drunk   Pentazocine Anxiety    hallucinations    HOME MEDICATIONS: Outpatient Medications Prior to Visit  Medication Sig Dispense Refill   ascorbic acid (VITAMIN C) 1000 MG tablet 1,000 mg.     atorvastatin (LIPITOR) 40 MG tablet TAKE 1 TABLET DAILY 90 tablet 3   azelastine (ASTELIN) 0.1 % nasal spray      betamethasone valerate (VALISONE) 0.1 % cream Apply topically.     betamethasone valerate (VALISONE) 0.1 % cream betamethasone valerate 0.1 % topical cream  APPLY A THIN LAYER TO THE AFFECTED AREA(S) TWICE DAILY FOR A WEEK THEN 2 3 TIMES WEEKLY ONGOING     Calcium Carbonate-Vitamin D  600-400 MG-UNIT tablet 1 tablet.     clobetasol ointment (TEMOVATE) 0.05 % Apply topically 2 (two) times daily.     donepezil  (ARICEPT ) 10 MG tablet Take 1 tablet (10 mg total) by mouth daily. 90 tablet 3   fenofibrate (  TRICOR) 145 MG tablet 145 mg.     Flaxseed, Linseed, (FLAXSEED OIL) 1000 MG CAPS Take by mouth daily.     Folic Acid-Vit B6-Vit B12 (B COMPLEX-FOLIC ACID) 500-5-200 MCG-MG-MCG TABS Take by mouth.     Galcanezumab -gnlm (EMGALITY ) 120 MG/ML SOAJ Inject 120 mg into the skin every 30 (thirty) days. 1 mL 5   hydrochlorothiazide (HYDRODIURIL) 12.5 MG tablet Take 12.5 mg by mouth daily.     meloxicam (MOBIC) 7.5 MG tablet Take 7.5 mg by mouth daily.     methocarbamol (ROBAXIN) 500 MG tablet Take 500 mg by mouth every 6 (six) hours as needed for muscle spasms (Every 6-8 hours as needed).     mirtazapine  (REMERON  SOL-TAB) 30 MG disintegrating tablet DISSOLVE ONE TABLET IN MOUTH AT BEDTIME 90 tablet 1   Multiple Vitamin (MULTIVITAMIN) capsule Take 1 capsule by mouth daily.     Omega-3 Fatty Acids (FISH OIL) 1000 MG CAPS 1 capsule.     omeprazole (PRILOSEC) 40 MG capsule Take 40 mg by mouth 2 (two) times daily.     oxyCODONE -acetaminophen  (PERCOCET) 10-325 MG tablet Take 1 tablet daily as needed. Do not exceed 2 tablets within 24 hours. 30 tablet  0   SUMAtriptan  (IMITREX ) 50 MG tablet TAKE 1 TABLET BY MOUTH AS NEEDED. MAY REPEAT IN 2 HOURS IF HEADACHE PERSISTS OR RECURS. 9 tablet 11   valsartan (DIOVAN) 160 MG tablet Take 160 mg by mouth daily.     venlafaxine  XR (EFFEXOR  XR) 75 MG 24 hr capsule Take 1 capsule (75 mg total) by mouth daily with breakfast. 90 capsule 3   VITAMIN D , CHOLECALCIFEROL, PO Take 1,000 Units by mouth daily.      Facility-Administered Medications Prior to Visit  Medication Dose Route Frequency Provider Last Rate Last Admin   0.9 %  sodium chloride  infusion  500 mL Intravenous Once Tobin Forts, MD        PAST MEDICAL HISTORY: Past Medical History:  Diagnosis Date   ALLERGIC RHINITIS 10/27/2006   Allergy    seasonal allergies   Anemia    hx of   Anxiety    hx of   Arthritis    back   Breast cancer (HCC) 2007   RIGHT   Cataract    bilateral -sx   Depression    hx of   GERD 10/27/2006   on meds   HYPERLIPIDEMIA 10/27/2006   on meds   HYPERTENSION 10/27/2006   on meds   Memory loss    Migraines    Organic brain syndrome    PULMONARY EMBOLISM, HX OF 2001   RENAL INSUFFICIENCY 08/01/2009    PAST SURGICAL HISTORY: Past Surgical History:  Procedure Laterality Date   ABDOMINAL HYSTERECTOMY  2001   BREAST SURGERY Bilateral 2008   mastectomy   CATARACT EXTRACTION Right 01/2016   CATARACT EXTRACTION Left 2013   CESAREAN SECTION  1988   CHOLECYSTECTOMY     COLONOSCOPY  2011   JP-F/V-movi(exc)-1 polyps removed and not sent to path   KNEE SURGERY Left 1996   NISSEN FUNDOPLICATION     TUBAL LIGATION     VOCAL CORD INJECTION     WISDOM TOOTH EXTRACTION      FAMILY HISTORY: Family History  Problem Relation Age of Onset   Heart disease Mother    Heart disease Father    Lung cancer Sister 57   Cancer Maternal Uncle        leukemia   Migraines Son  Migraines Daughter    Colon polyps Neg Hx    Colon cancer Neg Hx    Esophageal cancer Neg Hx    Stomach cancer Neg Hx    Rectal cancer  Neg Hx     SOCIAL HISTORY: Social History   Socioeconomic History   Marital status: Married    Spouse name: Tommy   Number of children: 2   Years of education: BS   Highest education level: Not on file  Occupational History    Employer: [employer]  Tobacco Use   Smoking status: Never   Smokeless tobacco: Never  Vaping Use   Vaping status: Never Used  Substance and Sexual Activity   Alcohol use: No   Drug use: Never   Sexual activity: Not on file  Other Topics Concern   Not on file  Social History Narrative   Patient is married Administrator, sports) and lives at home with her family.   Patient has two children.   Patient is disabled.   Patient drinks caffeine three to four times per week.   Patient is right-handed.   Patient has a Haematologist.   Social Drivers of Corporate investment banker Strain: Low Risk  (06/30/2023)   Received from [facility]   Overall Financial Resource Strain (CARDIA)    Difficulty of Paying Living Expenses: Not hard at all  Food Insecurity: No Food Insecurity (06/30/2023)   Received from [facility]   Hunger Vital Sign    Worried About Running Out of Food in the Last Year: Never true    Ran Out of Food in the Last Year: Never true  Transportation Needs: No Transportation Needs (06/30/2023)   Received from [facility] - Transportation    Lack of Transportation (Medical): No    Lack of Transportation (Non-Medical): No  Physical Activity: Insufficiently Active (04/09/2023)   Received from [facility]   Exercise Vital Sign    Days of Exercise per Week: 2 days    Minutes of Exercise per Session: 30 min  Stress: No Stress Concern Present (04/09/2023)   Received from [facility] - Occupational Stress Questionnaire    Feeling of Stress : Not at all  Social Connections: Socially Integrated (04/09/2023)   Received from [facility]   Social Network    How would you rate  your social network (family, work, friends)?: Good participation with social networks  Intimate Partner Violence: Not At Risk (04/09/2023)   Received from [facility]   HITS    Over the last 12 months how often did your partner physically hurt you?: Never    Over the last 12 months how often did your partner insult you or talk down to you?: Never    Over the last 12 months how often did your partner threaten you with physical harm?: Never    Over the last 12 months how often did your partner scream or curse at you?: Never      PHYSICAL EXAM Generalized: Well developed, in no acute distress   Neurological examination  Mentation: Alert oriented to time, place, history taking. Follows all commands speech and language fluent Cranial nerve II-XII:Extraocular movements were full. Facial symmetry noted. uvula tongue midline. Head turning and shoulder shrug  were normal and symmetric. Motor: Good strength throughout subjectively per patient Sensory: Sensory testing is intact to soft touch on all 4 extremities subjectively per patient Coordination: Cerebellar testing reveals good finger-nose-finger  Gait and station: Patient is able to stand from a seated position. gait is normal.  Reflexes: UTA  DIAGNOSTIC DATA (LABS, IMAGING, TESTING) - I reviewed patient records, labs, notes, testing and imaging myself where available.  Lab Results  Component Value Date   WBC 10.4 04/13/2020   HGB 12.5 04/13/2020   HCT 37.5 04/13/2020   MCV 91.9 04/13/2020   PLT 355 04/13/2020      Component Value Date/Time   NA 136 04/13/2020 1730   NA 141 10/23/2016 1444   NA 141 04/14/2013 0951   K 3.5 04/13/2020 1730   K 4.0 04/14/2013 0951   CL 100 04/13/2020 1730   CO2 22 04/13/2020 1730   CO2 28 04/14/2013 0951   GLUCOSE 104 (H) 04/13/2020 1730   GLUCOSE 101 04/14/2013 0951   BUN 29 (H) 04/13/2020 1730   BUN 21 10/23/2016 1444   BUN 23.6 04/14/2013 0951   CREATININE 0.78 04/13/2020 1730    CREATININE 0.8 04/14/2013 0951   CALCIUM 9.0 04/13/2020 1730   CALCIUM 9.5 04/14/2013 0951   PROT 6.5 04/13/2020 1730   PROT 7.0 10/23/2016 1444   PROT 6.7 04/14/2013 0951   ALBUMIN 3.7 04/13/2020 1730   ALBUMIN 4.5 10/23/2016 1444   ALBUMIN 4.0 04/14/2013 0951   AST 49 (H) 04/13/2020 1730   AST 18 04/14/2013 0951   ALT 53 (H) 04/13/2020 1730   ALT 24 04/14/2013 0951   ALKPHOS 62 04/13/2020 1730   ALKPHOS 60 04/14/2013 0951   BILITOT 0.4 04/13/2020 1730   BILITOT <0.2 10/23/2016 1444   BILITOT 0.34 04/14/2013 0951   GFRNONAA >60 04/13/2020 1730   GFRAA 86 10/23/2016 1444   Lab Results  Component Value Date   CHOL 168 02/20/2011   HDL 57.30 02/20/2011   LDLCALC 79 02/20/2011   LDLDIRECT 105.9 07/09/2006   TRIG 161.0 (H) 02/20/2011   CHOLHDL 3 02/20/2011   No results found for: "HGBA1C" Lab Results  Component Value Date   VITAMINB12 415 05/18/2008   Lab Results  Component Value Date   TSH 1.339 10/24/2010      ASSESSMENT AND PLAN 68 y.o. year old female  has a past medical history of ALLERGIC RHINITIS (10/27/2006), Allergy, Anemia, Anxiety, Arthritis, Breast cancer (HCC) (2007), Cataract, Depression, GERD (10/27/2006), HYPERLIPIDEMIA (10/27/2006), HYPERTENSION (10/27/2006), Memory loss, Migraines, Organic brain syndrome, PULMONARY EMBOLISM, HX OF (2001), and RENAL INSUFFICIENCY (08/01/2009). here with:    1.  Migraine headaches  Continue using Imitrex  as an abortive therapy  2.  Cognitive dysfunction  Did part of the MMSE today.  Patient had 25 points without doing language the three-step command, writing a sentence, copying a design.  She was unable to do these last 3 sections due to this being a virtual visit Continue Aricept  10 mg daily  Follow-up in 1 year or sooner if needed    Clem Currier, MSN, NP-C 08/10/2023, 8:34 AM [facility] [address] [place], [place] [address] [phone]

## 2023-08-11 ENCOUNTER — Telehealth (INDEPENDENT_AMBULATORY_CARE_PROVIDER_SITE_OTHER): Payer: Medicare HMO | Admitting: Adult Health

## 2023-08-11 DIAGNOSIS — G43101 Migraine with aura, not intractable, with status migrainosus: Secondary | ICD-10-CM

## 2023-08-11 DIAGNOSIS — R4189 Other symptoms and signs involving cognitive functions and awareness: Secondary | ICD-10-CM

## 2023-08-11 NOTE — Patient Instructions (Addendum)
 Your Plan:  Continue Imitrex  for abortive therapy for your migraines In the future we may could try Ubrelvy or Nurtec for abortive therapy  Continue Aricept  10 mg at bedtime for your memory We will get you back in office in July to test your memory   Discussed:  There is increased risk for stroke in women with migraine with aura and a contraindication for the combined contraceptive pill for use by women who have migraine with aura. The risk for women with migraine without aura is lower. However other risk factors like smoking are far more likely to increase stroke risk than migraine. There is a recommendation for no smoking and for the use of OCPs without estrogen such as progestogen only pills particularly for women with migraine with aura.Aaron Aas People who have migraine headaches with auras may be 3 times more likely to have a stroke caused by a blood clot, compared to migraine patients who don't see auras. Women who take hormone-replacement therapy may be 30 percent more likely to suffer a clot-based stroke than women not taking medication containing estrogen.    Thank you for coming to see us  at [facility]. I hope we have been able to provide you high quality care today.  You may receive a patient satisfaction survey over the next few weeks. We would appreciate your feedback and comments so that we may continue to improve ourselves and the health of our patients.

## 2023-09-17 ENCOUNTER — Telehealth: Payer: Self-pay | Admitting: Adult Health

## 2023-09-17 NOTE — Telephone Encounter (Signed)
 Pt states she and her husband do not feel that she needs the appointment

## 2023-09-21 ENCOUNTER — Telehealth: Payer: Self-pay | Admitting: Adult Health

## 2023-09-21 DIAGNOSIS — R4189 Other symptoms and signs involving cognitive functions and awareness: Secondary | ICD-10-CM

## 2023-09-21 DIAGNOSIS — G43101 Migraine with aura, not intractable, with status migrainosus: Secondary | ICD-10-CM

## 2023-09-21 MED ORDER — DONEPEZIL HCL 10 MG PO TABS: 10.0000 mg | ORAL_TABLET | Freq: Every day | ORAL | 3 refills | Status: AC

## 2023-09-21 NOTE — Telephone Encounter (Signed)
 Pt called needing a refill on her donepezil  (ARICEPT ) 10 MG tablet sent to the [facility] on [address].

## 2023-09-25 ENCOUNTER — Ambulatory Visit: Admitting: Adult Health

## 2024-01-14 ENCOUNTER — Ambulatory Visit (INDEPENDENT_AMBULATORY_CARE_PROVIDER_SITE_OTHER): Admitting: Adult Health

## 2024-01-14 ENCOUNTER — Encounter: Payer: Self-pay | Admitting: Adult Health

## 2024-01-14 VITALS — BP 136/81 | HR 93 | Ht 64.0 in | Wt 172.0 lb

## 2024-01-14 DIAGNOSIS — G43101 Migraine with aura, not intractable, with status migrainosus: Secondary | ICD-10-CM | POA: Diagnosis not present

## 2024-01-14 DIAGNOSIS — R4189 Other symptoms and signs involving cognitive functions and awareness: Secondary | ICD-10-CM

## 2024-01-14 MED ORDER — SUMATRIPTAN SUCCINATE 50 MG PO TABS: ORAL_TABLET | ORAL | 11 refills | Status: AC

## 2024-01-14 NOTE — Progress Notes (Signed)
 PATIENT: Lauren Fuller DOB: [birth year]  REASON FOR VISIT: follow up HISTORY FROM: patient PRIMARY NEUROLOGIST: Dr. Chalice  Chief Complaint  Patient presents with   Follow-up    Pt in 4 alone Pt here for memory decline Pt states had chemo and after treatment memory has declined      HISTORY OF PRESENT ILLNESS: Today 01/14/24  Lauren Fuller is a 68 y.o. female who has been followed in this office for migraine headaches and cognitive dysfunction . Returns today for follow-up.  She states that her husband mostly mentions that she forgets things that he tells her.  Her daughter has mentioned this on occasion but not as much as her husband does.  She lives at home with her son and husband.  Able to complete all ADLs independently.  Manages all of her medications appointments and finances independently.  No change in mood or behavior.  She is no longer working.  She denies any history or exposure to infectious disease or heavy metals.  She reports that her memory issues started after chemo.  They have continued but most recently she feels that its gotten worse and her husband has mentioned that as well.  In regards to her headaches they are under relatively good control.  No longer on Emgality .  Typically can take mustard when she gets a migraine but if that is not beneficial she takes sumatriptan .  She does get a sensory aura-- the right side of the face has a sensation that starts at the forehead and goes down to the cheek and then the migraine starts.  Denies any weakness on the right side.  Returns today for an evaluation.  08/10/23:   Lauren Fuller is a 68 y.o. female with a history of migraine headaches and cognitive dysfunction. Returns today for follow-up.  She reports that her headaches have been under relatively good control.  She has approximately 3-4 headaches a month.  Typically triggered by the weather.  She states that she typically will try a spoonful of mustard that usually  resolves her headache.  If that does not work she will use the Imitrex .  She does not feel that she needs to be on a preventative medication at this time.  In regards to the her memory she states that her husband tells her she has good days and bad days.  She reports on her bad days it seems like she is in a fall.  Or has slow reaction time.  She is able to complete all ADLs independently.  She operates a librarian, academic without difficulty.  Denies getting lost while driving.  She manages her own medications appointments and finances without difficulty.  Returns today for an evaluation.     08/12/22: Lauren Fuller is a 68 y.o. female with a history of Migraine headaches and Cognitive dysfunction. Returns today for follow-up.    Will get an aura- sensation down the right side of the face. She will take a spponful of mustard and the headache resolved within a couple of minutes. Only had a few headaches that the mustard did not work. Then she will use the imitrex . No longer taking Aimovig .  Feels that the combination of mustard and Imitrex  works well for her.   Reports that memory is stable. Still has days that she feels off. Usually only husband and daughter pick up on that. Able to complete all ADLs independently. Remains on Aricept  10 mg.       08/06/21:  Lauren Fuller is a 68 year old female with a history of migraine headaches.  She returns today for follow-up.  At the last visit she was started on Aimovig .  She reports that her headaches are doing well.  She reports that she is having 2-3 a month with weather changes. Continues to use Imitrex  for abortive therapy.On occasion has to take a second dose.  Remains on Effexor  75 mg daily prescribed by psychiatrist.    On Aricept  10 mg daily feels that memory and cognition is the same. On occasion still forgets things but its not been as often.    09/27/20: Lauren Fuller is a 68 year old female with a history of migraine headaches and cognitive dysfunction.   She returns today for follow-up.  She states that she has approximately 5 headaches a month.  She states that if she takes sumatriptan  right away and then lays down her headaches typically resolve.  For severe headache she does use oxycodone .  She states that she may have to use this 2-3 times a month.  The patient has tried Amerge, Relpax, Zonegran, Mobic and Depakote.  She feels that her memory has remained stable.  On occasion she may have a bad day.  She remains on Aricept  10 mg daily   09/22/19: Lauren Fuller is a 68 year old female with a history of migraine headaches and cognitive dysfunction following chemotherapy. Overall she feels that things have remained stable. She states that her cognition has been stable. She states that she goes through phases where she may have delayed responses but overall no new symptoms. She continues on Aricept .   Migraines remain under good control. She reports that she has approximately 4-5 headaches a month. Sumatriptan  tends to work well for her. If she has a severe headache she may take oxycodone . She has not refilled oxycodone  since last year. She remains on Effexor . She returns today for an evaluation.   HISTORY  05/18/18 Lauren Fuller is a 68 y.o. female here today for follow up for migraines and cognitive dysfunction following chemotherapy. She is taking Aricept  10mg  daily. She feels that cognition is stable. She is still noticing some forgetfulness but overall she is doing well. She has about 5 migraines a month. She feels this is significantly better than her baseline of daily migraines about 6-7 years ago. Botox helped to drop these migraine to about 15-18 per month. She is using sumatriptan  as needed for migraine abortion. She does feel that most times this will knock the headache out. She rarely has to take an oxycodone  for migraines. She got a dath piercing about 3 years ago. She feels that this has helped her.  She is helping to care for her adult son who is  suffering from seizures.   HISTORY: (copied from Velora Horstman's note on 04/28/2017) Lauren Fuller is a 68 year old female with a history of cognitive dysfunction after chemotherapy and migraine headaches.  She returns today for follow-up.  She reports that her headaches remain under good control.  She has approximately 5 headaches a month.  She typically can get relief with Amerge but if she has a severe headache that does not respond she will use oxycodone .  She states typically her headaches occur on the right side she does have photophobia but denies nausea and vomiting.  She feels that her cognition is slightly worse.  She states she has more of a concentration and focus issue.  In the past she has been on Adderall with no noticeable benefit.  She  is able to complete all ADLs independently.  She denies any issues driving.  She manages her own finances without difficulty.  Denies any change in appetite.  She is able to prepare meals without difficulty.  She returns today for an evaluation.    REVIEW OF SYSTEMS: Out of a complete 14 system review of symptoms, the patient complains only of the following symptoms, and all other reviewed systems are negative.   Listed in HPI  ALLERGIES: Allergies  Allergen Reactions   Aspirin-Acetaminophen -Caffeine Anaphylaxis    Excedrin - jittery excedrin REACTION: makes me really nervous inside   Acetaminophen -Caffeine     Other reaction(s): Other (See Comments) Shakiness   Butalbital-Aspirin-Caffeine     dizzy   Butalbital-Aspirin-Caffeine     dizzy   Codeine Itching    REACTION: itching   Cyclosporine Other (See Comments) and Itching   Fiorinal P-F W/Codeine  [Butalbital-Asa-Caff-Codeine] Other (See Comments)    FLORINAL 3.- severe sedation   Hydromorphone Hcl     REACTION: not effective   Pentazocine Lactate     REACTION: hallucinations   Pentazocine Lactate     Makes pt feel drunk   Pentazocine Anxiety    hallucinations    HOME  MEDICATIONS: Outpatient Medications Prior to Visit  Medication Sig Dispense Refill   ascorbic acid (VITAMIN C) 1000 MG tablet 1,000 mg.     atorvastatin (LIPITOR) 40 MG tablet TAKE 1 TABLET DAILY 90 tablet 3   azelastine (ASTELIN) 0.1 % nasal spray      betamethasone valerate (VALISONE) 0.1 % cream Apply topically.     betamethasone valerate (VALISONE) 0.1 % cream betamethasone valerate 0.1 % topical cream  APPLY A THIN LAYER TO THE AFFECTED AREA(S) TWICE DAILY FOR A WEEK THEN 2 3 TIMES WEEKLY ONGOING     Calcium Carbonate-Vitamin D  600-400 MG-UNIT tablet 1 tablet.     clobetasol ointment (TEMOVATE) 0.05 % Apply topically 2 (two) times daily.     donepezil  (ARICEPT ) 10 MG tablet Take 1 tablet (10 mg total) by mouth daily. 90 tablet 3   fenofibrate (TRICOR) 145 MG tablet 145 mg.     Flaxseed, Linseed, (FLAXSEED OIL) 1000 MG CAPS Take by mouth daily.     Folic Acid-Vit B6-Vit B12 (B COMPLEX-FOLIC ACID) 500-5-200 MCG-MG-MCG TABS Take by mouth.     meloxicam (MOBIC) 7.5 MG tablet Take 7.5 mg by mouth daily.     methocarbamol (ROBAXIN) 500 MG tablet Take 500 mg by mouth every 6 (six) hours as needed for muscle spasms (Every 6-8 hours as needed).     mirtazapine  (REMERON  SOL-TAB) 30 MG disintegrating tablet DISSOLVE ONE TABLET IN MOUTH AT BEDTIME 90 tablet 1   Multiple Vitamin (MULTIVITAMIN) capsule Take 1 capsule by mouth daily.     Omega-3 Fatty Acids (FISH OIL) 1000 MG CAPS 1 capsule.     valsartan (DIOVAN) 160 MG tablet Take 160 mg by mouth daily.     venlafaxine  XR (EFFEXOR  XR) 75 MG 24 hr capsule Take 1 capsule (75 mg total) by mouth daily with breakfast. 90 capsule 3   VITAMIN D , CHOLECALCIFEROL, PO Take 1,000 Units by mouth daily.      SUMAtriptan  (IMITREX ) 50 MG tablet TAKE 1 TABLET BY MOUTH AS NEEDED. MAY REPEAT IN 2 HOURS IF HEADACHE PERSISTS OR RECURS. 9 tablet 11   hydrochlorothiazide (HYDRODIURIL) 12.5 MG tablet Take 12.5 mg by mouth daily.     omeprazole (PRILOSEC) 40 MG capsule  Take 40 mg by mouth 2 (two) times daily.  Galcanezumab -gnlm (EMGALITY ) 120 MG/ML SOAJ Inject 120 mg into the skin every 30 (thirty) days. 1 mL 5   oxyCODONE -acetaminophen  (PERCOCET) 10-325 MG tablet Take 1 tablet daily as needed. Do not exceed 2 tablets within 24 hours. 30 tablet 0   Facility-Administered Medications Prior to Visit  Medication Dose Route Frequency Provider Last Rate Last Admin   0.9 %  sodium chloride  infusion  500 mL Intravenous Once Abran Norleen SAILOR, MD        PAST MEDICAL HISTORY: Past Medical History:  Diagnosis Date   ALLERGIC RHINITIS 10/27/2006   Allergy    seasonal allergies   Anemia    hx of   Anxiety    hx of   Arthritis    back   Breast cancer (HCC) 2007   RIGHT   Cataract    bilateral -sx   Depression    hx of   GERD 10/27/2006   on meds   HYPERLIPIDEMIA 10/27/2006   on meds   HYPERTENSION 10/27/2006   on meds   Memory loss    Migraines    Organic brain syndrome    PULMONARY EMBOLISM, HX OF 2001   RENAL INSUFFICIENCY 08/01/2009    PAST SURGICAL HISTORY: Past Surgical History:  Procedure Laterality Date   ABDOMINAL HYSTERECTOMY  2001   BREAST SURGERY Bilateral 2008   mastectomy   CATARACT EXTRACTION Right 01/2016   CATARACT EXTRACTION Left 2013   CESAREAN SECTION  1988   CHOLECYSTECTOMY     COLONOSCOPY  2011   JP-F/V-movi(exc)-1 polyps removed and not sent to path   KNEE SURGERY Left 1996   NISSEN FUNDOPLICATION     TUBAL LIGATION     VOCAL CORD INJECTION     WISDOM TOOTH EXTRACTION      FAMILY HISTORY: Family History  Problem Relation Age of Onset   Heart disease Mother    Heart disease Father    Lung cancer Sister 44   Cancer Maternal Uncle        leukemia   Migraines Daughter    Migraines Son    Colon polyps Neg Hx    Colon cancer Neg Hx    Esophageal cancer Neg Hx    Stomach cancer Neg Hx    Rectal cancer Neg Hx    Alzheimer's disease Neg Hx    Dementia Neg Hx     SOCIAL HISTORY: Social History   Socioeconomic  History   Marital status: Married    Spouse name: Tommy   Number of children: 2   Years of education: BS   Highest education level: Not on file  Occupational History    Employer: [employer]  Tobacco Use   Smoking status: Never   Smokeless tobacco: Never  Vaping Use   Vaping status: Never Used  Substance and Sexual Activity   Alcohol use: No   Drug use: Never   Sexual activity: Not on file  Other Topics Concern   Not on file  Social History Narrative   Patient is married Administrator, Sports) and lives at home with her family.   Patient has two children.   Patient is disabled.   Patient drinks caffeine three to four times per week.   Patient is right-handed.   Patient has a Haematologist.   Social Drivers of Corporate Investment Banker Strain: Low Risk  (06/30/2023)   Received from [facility]   Overall Financial Resource Strain (CARDIA)    Difficulty of Paying Living Expenses: Not  hard at all  Food Insecurity: No Food Insecurity (06/30/2023)   Received from [facility]   Hunger Vital Sign    Within the past 12 months, you worried that your food would run out before you got the money to buy more.: Never true    Within the past 12 months, the food you bought just didn't last and you didn't have money to get more.: Never true  Transportation Needs: No Transportation Needs (06/30/2023)   Received from [facility]   PRAPARE - Transportation    Lack of Transportation (Medical): No    Lack of Transportation (Non-Medical): No  Physical Activity: Insufficiently Active (04/09/2023)   Received from [facility]   Exercise Vital Sign    On average, how many days per week do you engage in moderate to strenuous exercise (like a brisk walk)?: 2 days    On average, how many minutes do you engage in exercise at this level?: 30 min  Stress: No Stress Concern Present (04/09/2023)   Received from [facility] - Occupational Stress  Questionnaire    Feeling of Stress : Not at all  Social Connections: Socially Integrated (04/09/2023)   Received from [facility]   Social Network    How would you rate your social network (family, work, friends)?: Good participation with social networks  Intimate Partner Violence: Not At Risk (04/09/2023)   Received from [facility]   HITS    Over the last 12 months how often did your partner physically hurt you?: Never    Over the last 12 months how often did your partner insult you or talk down to you?: Never    Over the last 12 months how often did your partner threaten you with physical harm?: Never    Over the last 12 months how often did your partner scream or curse at you?: Never      PHYSICAL EXAM  Vitals:   01/14/24 0923  BP: 136/81  Pulse: 93  Weight: 172 lb (78 kg)  Height: 5' 4 (1.626 m)   Body mass index is 29.52 kg/m.     08/12/2022    1:00 PM  MMSE - Mini Mental State Exam  Orientation to time 5  Orientation to Place 5  Registration 3  Attention/ Calculation 5  Recall 3  Language- name 2 objects 2  Language- repeat 1  Language- follow 3 step command --  Language- follow 3 step command-comments unable to complete VV  Language- read & follow direction 1  Write a sentence --  Write a sentence-comments unable to complete VV  Copy design --  Copy design-comments unable to compelte VV      01/14/2024    9:25 AM 04/28/2017   11:23 AM 04/24/2016    1:02 PM 10/15/2015    1:05 PM 04/05/2015   10:59 AM  Montreal Cognitive Assessment   Visuospatial/ Executive (0/5) 5 5 5 5 5   Naming (0/3) 3 3 3 3 3   Attention: Read list of digits (0/2) 1 1 2 1 2   Attention: Read list of letters (0/1) 1 1 1 1 1   Attention: Serial 7 subtraction starting at 100 (0/3) 3 3 2 3 3   Language: Repeat phrase (0/2) 1 2 2 2 2   Language : Fluency (0/1) 1 1 1 1 1   Abstraction (0/2) 2 2 2 2 2   Delayed Recall (0/5) 4 3 4 3 4   Orientation (0/6) 6 6 6 6  5  Total 27 27 28 27 28    Adjusted Score (based on education)     28      Generalized: Well developed, in no acute distress   Neurological examination  Mentation: Alert oriented to time, place, history taking. Follows all commands speech and language fluent Cranial nerve II-XII: Pupils were equal round reactive to light. Extraocular movements were full, visual field were full on confrontational test. Facial sensation and strength were normal. Uvula tongue midline. Head turning and shoulder shrug  were normal and symmetric. Motor: The motor testing reveals 5 over 5 strength of all 4 extremities. Good symmetric motor tone is noted throughout.  Sensory: Sensory testing is intact to soft touch on all 4 extremities. No evidence of extinction is noted.  Coordination: Cerebellar testing reveals good finger-nose-finger and heel-to-shin bilaterally.  Gait and station: Gait is normal.   Reflexes: Deep tendon reflexes are symmetric and normal bilaterally.   DIAGNOSTIC DATA (LABS, IMAGING, TESTING) - I reviewed patient records, labs, notes, testing and imaging myself where available.  Lab Results  Component Value Date   WBC 10.4 04/13/2020   HGB 12.5 04/13/2020   HCT 37.5 04/13/2020   MCV 91.9 04/13/2020   PLT 355 04/13/2020      Component Value Date/Time   NA 136 04/13/2020 1730   NA 141 10/23/2016 1444   NA 141 04/14/2013 0951   K 3.5 04/13/2020 1730   K 4.0 04/14/2013 0951   CL 100 04/13/2020 1730   CO2 22 04/13/2020 1730   CO2 28 04/14/2013 0951   GLUCOSE 104 (H) 04/13/2020 1730   GLUCOSE 101 04/14/2013 0951   BUN 29 (H) 04/13/2020 1730   BUN 21 10/23/2016 1444   BUN 23.6 04/14/2013 0951   CREATININE 0.78 04/13/2020 1730   CREATININE 0.8 04/14/2013 0951   CALCIUM 9.0 04/13/2020 1730   CALCIUM 9.5 04/14/2013 0951   PROT 6.5 04/13/2020 1730   PROT 7.0 10/23/2016 1444   PROT 6.7 04/14/2013 0951   ALBUMIN 3.7 04/13/2020 1730   ALBUMIN 4.5 10/23/2016 1444   ALBUMIN 4.0 04/14/2013 0951   AST 49 (H)  04/13/2020 1730   AST 18 04/14/2013 0951   ALT 53 (H) 04/13/2020 1730   ALT 24 04/14/2013 0951   ALKPHOS 62 04/13/2020 1730   ALKPHOS 60 04/14/2013 0951   BILITOT 0.4 04/13/2020 1730   BILITOT <0.2 10/23/2016 1444   BILITOT 0.34 04/14/2013 0951   GFRNONAA >60 04/13/2020 1730   GFRAA 86 10/23/2016 1444   Lab Results  Component Value Date   CHOL 168 02/20/2011   HDL 57.30 02/20/2011   LDLCALC 79 02/20/2011   LDLDIRECT 105.9 07/09/2006   TRIG 161.0 (H) 02/20/2011   CHOLHDL 3 02/20/2011   No results found for: HGBA1C Lab Results  Component Value Date   VITAMINB12 415 05/18/2008   Lab Results  Component Value Date   TSH 1.339 10/24/2010      ASSESSMENT AND PLAN 68 y.o. year old female  has a past medical history of ALLERGIC RHINITIS (10/27/2006), Allergy, Anemia, Anxiety, Arthritis, Breast cancer (HCC) (2007), Cataract, Depression, GERD (10/27/2006), HYPERLIPIDEMIA (10/27/2006), HYPERTENSION (10/27/2006), Memory loss, Migraines, Organic brain syndrome, PULMONARY EMBOLISM, HX OF (2001), and RENAL INSUFFICIENCY (08/01/2009). here with:  Cognitive dysfunction  - MOCA 27/30- stable - Cognitive function started after chemotherapy.  Reports that most recently it has gotten worse per patient.  For that reason we will repeat an MRI of the brain with and without contrast.  Patient states that she has no  contraindications to contrast.  Denies allergy or renal impairment - Will check blood work today for reversible causes of memory complaints - May consider repeating neuropsychological testing.  Last testing was done by Dr. Jackquline in 2017 - Continue Aricept  10 mg at bedtime  2.  Migraine headaches  -Stable - Continue sumatriptan  as needed for abortive therapy  She was advised to let us  know if she has any new or worsening symptoms.  Follow-up in 6 to 8 months or sooner if needed   Meds ordered this encounter  Medications   SUMAtriptan  (IMITREX ) 50 MG tablet    Sig: TAKE 1 TABLET  BY MOUTH AS NEEDED. MAY REPEAT IN 2 HOURS IF HEADACHE PERSISTS OR RECURS.    Dispense:  9 tablet    Refill:  11    Supervising Provider:   YAN, YIJUN [[id number]]   Orders Placed This Encounter  Procedures   MR BRAIN W WO CONTRAST   Dementia Panel   CBC with Differential/Platelets   CMP     Duwaine Russell, MSN, NP-C 01/14/2024, 10:10 AM [facility] [address] [place], [place] [address] [phone]  The patient's condition requires frequent monitoring and adjustments in the treatment plan, reflecting the ongoing complexity of care.  This provider is the continuing focal point for all needed services for this condition.

## 2024-01-14 NOTE — Patient Instructions (Signed)
 Your Plan:  Memory score stable Blood work today MRI of the brain with and without contrast Continue Aricept      Thank you for coming to see us  at [facility]. I hope we have been able to provide you high quality care today.  You may receive a patient satisfaction survey over the next few weeks. We would appreciate your feedback and comments so that we may continue to improve ourselves and the health of our patients.

## 2024-01-16 LAB — CBC WITH DIFFERENTIAL/PLATELET
Basophils Absolute: 0 x10E3/uL (ref 0.0–0.2)
Basos: 1 %
EOS (ABSOLUTE): 0.1 x10E3/uL (ref 0.0–0.4)
Eos: 2 %
Hematocrit: 37.2 % (ref 34.0–46.6)
Hemoglobin: 12.1 g/dL (ref 11.1–15.9)
Immature Grans (Abs): 0 x10E3/uL (ref 0.0–0.1)
Immature Granulocytes: 0 %
Lymphocytes Absolute: 1.9 x10E3/uL (ref 0.7–3.1)
Lymphs: 35 %
MCH: 31.4 pg (ref 26.6–33.0)
MCHC: 32.5 g/dL (ref 31.5–35.7)
MCV: 97 fL (ref 79–97)
Monocytes Absolute: 0.3 x10E3/uL (ref 0.1–0.9)
Monocytes: 6 %
Neutrophils Absolute: 3 x10E3/uL (ref 1.4–7.0)
Neutrophils: 56 %
Platelets: 367 x10E3/uL (ref 150–450)
RBC: 3.85 x10E6/uL (ref 3.77–5.28)
RDW: 12.3 % (ref 11.7–15.4)
WBC: 5.4 x10E3/uL (ref 3.4–10.8)

## 2024-01-16 LAB — COMPREHENSIVE METABOLIC PANEL WITH GFR
ALT: 32 IU/L (ref 0–32)
AST: 30 IU/L (ref 0–40)
Albumin: 4.3 g/dL (ref 3.9–4.9)
Alkaline Phosphatase: 58 IU/L (ref 49–135)
BUN/Creatinine Ratio: 33 — ABNORMAL HIGH (ref 12–28)
BUN: 25 mg/dL (ref 8–27)
Bilirubin Total: 0.3 mg/dL (ref 0.0–1.2)
CO2: 22 mmol/L (ref 20–29)
Calcium: 9.5 mg/dL (ref 8.7–10.3)
Chloride: 104 mmol/L (ref 96–106)
Creatinine, Ser: 0.76 mg/dL (ref 0.57–1.00)
Globulin, Total: 2 g/dL (ref 1.5–4.5)
Glucose: 125 mg/dL — ABNORMAL HIGH (ref 70–99)
Potassium: 4 mmol/L (ref 3.5–5.2)
Sodium: 141 mmol/L (ref 134–144)
Total Protein: 6.3 g/dL (ref 6.0–8.5)
eGFR: 86 mL/min/1.73 (ref >59)

## 2024-01-16 LAB — DEMENTIA PANEL
Homocysteine: 10.5 umol/L (ref 0.0–17.2)
RPR Ser Ql: NONREACTIVE
TSH: 1.21 u[IU]/mL (ref 0.450–4.500)
Vitamin B-12: 499 pg/mL (ref 232–1245)

## 2024-01-18 ENCOUNTER — Ambulatory Visit: Payer: Self-pay | Admitting: Adult Health

## 2024-01-20 ENCOUNTER — Telehealth: Payer: Self-pay | Admitting: Adult Health

## 2024-01-20 NOTE — Telephone Encounter (Signed)
MRI order sent to [facility] [phone]

## 2024-02-01 ENCOUNTER — Encounter: Payer: Self-pay | Admitting: Adult Health

## 2024-02-04 ENCOUNTER — Other Ambulatory Visit

## 2024-02-29 ENCOUNTER — Ambulatory Visit
Admission: RE | Admit: 2024-02-29 | Discharge: 2024-02-29 | Disposition: A | Source: Ambulatory Visit | Attending: Adult Health

## 2024-02-29 DIAGNOSIS — R4189 Other symptoms and signs involving cognitive functions and awareness: Secondary | ICD-10-CM

## 2024-02-29 MED ORDER — GADOPICLENOL 0.5 MMOL/ML IV SOLN
7.5000 mL | Freq: Once | INTRAVENOUS | Status: AC | PRN
  Administered 2024-02-29: 11:00:00 7.5 mL via INTRAVENOUS

## 2024-10-12 ENCOUNTER — Ambulatory Visit: Admitting: Adult Health
